# Patient Record
Sex: Male | Born: 1935 | Race: White | Hispanic: No | Marital: Married | Smoking: Former smoker
Health system: Southern US, Community
[De-identification: ages and names within clinical notes are randomized; demographics above are authoritative.]

## PROBLEM LIST (undated history)

## (undated) DIAGNOSIS — D126 Benign neoplasm of colon, unspecified: Secondary | ICD-10-CM

## (undated) DIAGNOSIS — I714 Abdominal aortic aneurysm, without rupture, unspecified: Secondary | ICD-10-CM

## (undated) DIAGNOSIS — J449 Chronic obstructive pulmonary disease, unspecified: Secondary | ICD-10-CM

## (undated) DIAGNOSIS — M109 Gout, unspecified: Secondary | ICD-10-CM

## (undated) DIAGNOSIS — K8020 Calculus of gallbladder without cholecystitis without obstruction: Secondary | ICD-10-CM

## (undated) DIAGNOSIS — I639 Cerebral infarction, unspecified: Secondary | ICD-10-CM

## (undated) DIAGNOSIS — K2980 Duodenitis without bleeding: Secondary | ICD-10-CM

## (undated) DIAGNOSIS — N400 Enlarged prostate without lower urinary tract symptoms: Secondary | ICD-10-CM

## (undated) DIAGNOSIS — I10 Essential (primary) hypertension: Secondary | ICD-10-CM

## (undated) DIAGNOSIS — K2990 Gastroduodenitis, unspecified, without bleeding: Secondary | ICD-10-CM

## (undated) DIAGNOSIS — R110 Nausea: Secondary | ICD-10-CM

## (undated) DIAGNOSIS — I739 Peripheral vascular disease, unspecified: Secondary | ICD-10-CM

## (undated) DIAGNOSIS — R739 Hyperglycemia, unspecified: Secondary | ICD-10-CM

## (undated) DIAGNOSIS — K222 Esophageal obstruction: Secondary | ICD-10-CM

## (undated) DIAGNOSIS — F17210 Nicotine dependence, cigarettes, uncomplicated: Secondary | ICD-10-CM

## (undated) DIAGNOSIS — M545 Low back pain, unspecified: Secondary | ICD-10-CM

## (undated) DIAGNOSIS — T7840XA Allergy, unspecified, initial encounter: Secondary | ICD-10-CM

## (undated) DIAGNOSIS — K2970 Gastritis, unspecified, without bleeding: Secondary | ICD-10-CM

## (undated) DIAGNOSIS — Z87442 Personal history of urinary calculi: Secondary | ICD-10-CM

## (undated) DIAGNOSIS — E785 Hyperlipidemia, unspecified: Secondary | ICD-10-CM

## (undated) DIAGNOSIS — IMO0002 Reserved for concepts with insufficient information to code with codable children: Secondary | ICD-10-CM

## (undated) DIAGNOSIS — K5730 Diverticulosis of large intestine without perforation or abscess without bleeding: Secondary | ICD-10-CM

## (undated) HISTORY — DX: Low back pain: M54.5

## (undated) HISTORY — DX: Esophageal obstruction: K22.2

## (undated) HISTORY — DX: Peripheral vascular disease, unspecified: I73.9

## (undated) HISTORY — DX: Cerebral infarction, unspecified: I63.9

## (undated) HISTORY — DX: Hyperlipidemia, unspecified: E78.5

## (undated) HISTORY — DX: Reserved for concepts with insufficient information to code with codable children: IMO0002

## (undated) HISTORY — DX: Gastritis, unspecified, without bleeding: K29.70

## (undated) HISTORY — DX: Hyperglycemia, unspecified: R73.9

## (undated) HISTORY — DX: Benign prostatic hyperplasia without lower urinary tract symptoms: N40.0

## (undated) HISTORY — DX: Essential (primary) hypertension: I10

## (undated) HISTORY — DX: Duodenitis without bleeding: K29.80

## (undated) HISTORY — PX: TOE AMPUTATION: SHX809

## (undated) HISTORY — DX: Chronic obstructive pulmonary disease, unspecified: J44.9

## (undated) HISTORY — DX: Diverticulosis of large intestine without perforation or abscess without bleeding: K57.30

## (undated) HISTORY — DX: Nicotine dependence, cigarettes, uncomplicated: F17.210

## (undated) HISTORY — PX: FEMORAL-POPLITEAL BYPASS GRAFT: SHX937

## (undated) HISTORY — PX: OTHER SURGICAL HISTORY: SHX169

## (undated) HISTORY — DX: Benign neoplasm of colon, unspecified: D12.6

## (undated) HISTORY — DX: Calculus of gallbladder without cholecystitis without obstruction: K80.20

## (undated) HISTORY — DX: Gastroduodenitis, unspecified, without bleeding: K29.90

## (undated) HISTORY — DX: Allergy, unspecified, initial encounter: T78.40XA

## (undated) HISTORY — PX: CATARACT EXTRACTION: SUR2

## (undated) HISTORY — DX: Gout, unspecified: M10.9

## (undated) HISTORY — DX: Abdominal aortic aneurysm, without rupture, unspecified: I71.40

## (undated) HISTORY — DX: Low back pain, unspecified: M54.50

## (undated) HISTORY — DX: Nausea: R11.0

## (undated) HISTORY — DX: Personal history of urinary calculi: Z87.442

## (undated) HISTORY — PX: EYE SURGERY: SHX253

## (undated) HISTORY — DX: Abdominal aortic aneurysm, without rupture: I71.4

---

## 2001-04-12 ENCOUNTER — Encounter: Payer: Self-pay | Admitting: Family Medicine

## 2001-04-12 LAB — CONVERTED CEMR LAB: WBC, blood: 6.6 10*3/uL

## 2001-07-04 ENCOUNTER — Ambulatory Visit (HOSPITAL_BASED_OUTPATIENT_CLINIC_OR_DEPARTMENT_OTHER): Admission: RE | Admit: 2001-07-04 | Discharge: 2001-07-04 | Payer: Self-pay | Admitting: Plastic Surgery

## 2001-10-14 ENCOUNTER — Encounter: Payer: Self-pay | Admitting: Surgery

## 2001-10-19 ENCOUNTER — Ambulatory Visit (HOSPITAL_COMMUNITY): Admission: RE | Admit: 2001-10-19 | Discharge: 2001-10-19 | Payer: Self-pay | Admitting: Surgery

## 2002-03-21 HISTORY — PX: INGUINAL HERNIA REPAIR: SHX194

## 2002-04-14 ENCOUNTER — Encounter: Payer: Self-pay | Admitting: Family Medicine

## 2002-04-14 LAB — CONVERTED CEMR LAB
Blood Glucose, Fasting: 111 mg/dL
PSA: 0.5 ng/mL
RBC count: 4.49 10*6/uL
TSH: 0.61 microintl units/mL
WBC, blood: 7.7 10*3/uL

## 2003-04-17 ENCOUNTER — Encounter: Payer: Self-pay | Admitting: Family Medicine

## 2003-04-17 LAB — CONVERTED CEMR LAB: Blood Glucose, Fasting: 115 mg/dL

## 2003-04-18 ENCOUNTER — Encounter: Payer: Self-pay | Admitting: Family Medicine

## 2003-04-18 LAB — CONVERTED CEMR LAB: PSA: 0.4 ng/mL

## 2003-06-01 ENCOUNTER — Encounter: Payer: Self-pay | Admitting: Family Medicine

## 2003-06-01 LAB — CONVERTED CEMR LAB
RBC count: 4.51 10*6/uL
WBC, blood: 6.8 10*3/uL

## 2003-07-16 ENCOUNTER — Encounter: Payer: Self-pay | Admitting: Family Medicine

## 2003-07-16 LAB — CONVERTED CEMR LAB: WBC, blood: 6.9 10*3/uL

## 2004-05-27 ENCOUNTER — Encounter: Payer: Self-pay | Admitting: Family Medicine

## 2004-05-27 LAB — CONVERTED CEMR LAB
Blood Glucose, Fasting: 108 mg/dL
TSH: 0.86 microintl units/mL
WBC, blood: 6.1 10*3/uL

## 2004-06-30 ENCOUNTER — Ambulatory Visit: Payer: Self-pay | Admitting: Family Medicine

## 2004-08-22 ENCOUNTER — Ambulatory Visit: Payer: Self-pay | Admitting: Family Medicine

## 2004-08-26 ENCOUNTER — Ambulatory Visit: Payer: Self-pay | Admitting: Family Medicine

## 2004-09-02 ENCOUNTER — Ambulatory Visit: Payer: Self-pay | Admitting: Family Medicine

## 2004-10-09 HISTORY — PX: OTHER SURGICAL HISTORY: SHX169

## 2004-10-13 ENCOUNTER — Ambulatory Visit: Payer: Self-pay | Admitting: Family Medicine

## 2004-10-16 ENCOUNTER — Encounter: Admission: RE | Admit: 2004-10-16 | Discharge: 2004-10-16 | Payer: Self-pay | Admitting: Family Medicine

## 2004-11-03 ENCOUNTER — Ambulatory Visit: Payer: Self-pay | Admitting: Family Medicine

## 2004-11-17 ENCOUNTER — Ambulatory Visit: Payer: Self-pay | Admitting: Family Medicine

## 2004-12-03 ENCOUNTER — Ambulatory Visit: Payer: Self-pay | Admitting: Gastroenterology

## 2004-12-10 ENCOUNTER — Ambulatory Visit: Payer: Self-pay | Admitting: Gastroenterology

## 2004-12-15 ENCOUNTER — Ambulatory Visit: Payer: Self-pay | Admitting: Gastroenterology

## 2004-12-15 HISTORY — PX: ESOPHAGOGASTRODUODENOSCOPY: SHX1529

## 2005-01-02 ENCOUNTER — Ambulatory Visit: Payer: Self-pay | Admitting: Gastroenterology

## 2005-02-02 ENCOUNTER — Encounter: Admission: RE | Admit: 2005-02-02 | Discharge: 2005-02-02 | Payer: Self-pay | Admitting: *Deleted

## 2005-02-12 ENCOUNTER — Ambulatory Visit: Payer: Self-pay | Admitting: Family Medicine

## 2005-03-31 ENCOUNTER — Ambulatory Visit: Payer: Self-pay | Admitting: Family Medicine

## 2005-05-21 ENCOUNTER — Ambulatory Visit: Payer: Self-pay | Admitting: Family Medicine

## 2005-05-21 LAB — CONVERTED CEMR LAB
RBC count: 4.12 10*6/uL
WBC, blood: 6.5 10*3/uL

## 2005-05-25 ENCOUNTER — Ambulatory Visit: Payer: Self-pay | Admitting: Family Medicine

## 2005-06-08 ENCOUNTER — Ambulatory Visit: Payer: Self-pay | Admitting: Family Medicine

## 2005-07-06 HISTORY — PX: OTHER SURGICAL HISTORY: SHX169

## 2005-12-24 HISTORY — PX: OTHER SURGICAL HISTORY: SHX169

## 2006-05-18 ENCOUNTER — Ambulatory Visit: Payer: Self-pay | Admitting: Internal Medicine

## 2006-06-10 ENCOUNTER — Ambulatory Visit: Payer: Self-pay | Admitting: Family Medicine

## 2006-06-10 LAB — CONVERTED CEMR LAB: Blood Glucose, Fasting: 114 mg/dL

## 2006-06-14 ENCOUNTER — Ambulatory Visit: Payer: Self-pay | Admitting: Family Medicine

## 2006-06-24 HISTORY — PX: OTHER SURGICAL HISTORY: SHX169

## 2006-07-12 ENCOUNTER — Ambulatory Visit: Payer: Self-pay

## 2006-07-22 ENCOUNTER — Encounter: Admission: RE | Admit: 2006-07-22 | Discharge: 2006-07-22 | Payer: Self-pay | Admitting: *Deleted

## 2006-07-22 HISTORY — PX: OTHER SURGICAL HISTORY: SHX169

## 2006-07-28 ENCOUNTER — Ambulatory Visit: Payer: Self-pay | Admitting: Family Medicine

## 2006-07-28 LAB — CONVERTED CEMR LAB: Blood Glucose, Fasting: 120 mg/dL

## 2006-07-29 ENCOUNTER — Ambulatory Visit (HOSPITAL_COMMUNITY): Admission: RE | Admit: 2006-07-29 | Discharge: 2006-07-29 | Payer: Self-pay | Admitting: Vascular Surgery

## 2006-07-29 HISTORY — PX: OTHER SURGICAL HISTORY: SHX169

## 2006-08-17 DIAGNOSIS — I639 Cerebral infarction, unspecified: Secondary | ICD-10-CM

## 2006-08-17 HISTORY — DX: Cerebral infarction, unspecified: I63.9

## 2006-08-31 ENCOUNTER — Inpatient Hospital Stay (HOSPITAL_COMMUNITY): Admission: RE | Admit: 2006-08-31 | Discharge: 2006-09-03 | Payer: Self-pay | Admitting: *Deleted

## 2006-08-31 ENCOUNTER — Encounter (INDEPENDENT_AMBULATORY_CARE_PROVIDER_SITE_OTHER): Payer: Self-pay | Admitting: Specialist

## 2006-08-31 HISTORY — PX: OTHER SURGICAL HISTORY: SHX169

## 2006-09-01 HISTORY — PX: OTHER SURGICAL HISTORY: SHX169

## 2006-10-11 ENCOUNTER — Ambulatory Visit: Payer: Self-pay | Admitting: Vascular Surgery

## 2006-10-13 ENCOUNTER — Ambulatory Visit (HOSPITAL_COMMUNITY): Admission: RE | Admit: 2006-10-13 | Discharge: 2006-10-13 | Payer: Self-pay | Admitting: Urology

## 2006-10-21 ENCOUNTER — Ambulatory Visit: Payer: Self-pay | Admitting: *Deleted

## 2006-10-26 ENCOUNTER — Ambulatory Visit (HOSPITAL_BASED_OUTPATIENT_CLINIC_OR_DEPARTMENT_OTHER): Admission: RE | Admit: 2006-10-26 | Discharge: 2006-10-26 | Payer: Self-pay | Admitting: Urology

## 2006-11-18 ENCOUNTER — Ambulatory Visit: Payer: Self-pay | Admitting: *Deleted

## 2006-11-18 HISTORY — PX: OTHER SURGICAL HISTORY: SHX169

## 2007-06-06 ENCOUNTER — Ambulatory Visit: Payer: Self-pay | Admitting: Family Medicine

## 2007-06-07 ENCOUNTER — Telehealth: Payer: Self-pay | Admitting: Family Medicine

## 2007-06-09 ENCOUNTER — Ambulatory Visit: Payer: Self-pay | Admitting: *Deleted

## 2007-06-09 ENCOUNTER — Encounter: Admission: RE | Admit: 2007-06-09 | Discharge: 2007-06-09 | Payer: Self-pay | Admitting: *Deleted

## 2007-07-13 HISTORY — PX: OTHER SURGICAL HISTORY: SHX169

## 2007-08-29 ENCOUNTER — Ambulatory Visit: Payer: Self-pay | Admitting: Family Medicine

## 2007-08-31 ENCOUNTER — Encounter: Payer: Self-pay | Admitting: Family Medicine

## 2008-01-12 ENCOUNTER — Ambulatory Visit: Payer: Self-pay | Admitting: *Deleted

## 2008-04-10 ENCOUNTER — Ambulatory Visit: Payer: Self-pay | Admitting: Family Medicine

## 2008-04-10 DIAGNOSIS — R110 Nausea: Secondary | ICD-10-CM

## 2008-04-11 LAB — CONVERTED CEMR LAB
ALT: 14 units/L (ref 0–53)
Albumin: 4 g/dL (ref 3.5–5.2)
Basophils Absolute: 0.1 10*3/uL (ref 0.0–0.1)
Basophils Relative: 1.2 % (ref 0.0–3.0)
Bilirubin, Direct: 0.1 mg/dL (ref 0.0–0.3)
Creatinine, Ser: 1.7 mg/dL — ABNORMAL HIGH (ref 0.4–1.5)
Eosinophils Absolute: 0.2 10*3/uL (ref 0.0–0.7)
Eosinophils Relative: 3.1 % (ref 0.0–5.0)
Hemoglobin: 13 g/dL (ref 13.0–17.0)
Lymphocytes Relative: 25.7 % (ref 12.0–46.0)
MCV: 92.6 fL (ref 78.0–100.0)
Monocytes Absolute: 0.3 10*3/uL (ref 0.1–1.0)
Monocytes Relative: 4.5 % (ref 3.0–12.0)
Neutro Abs: 4.3 10*3/uL (ref 1.4–7.7)
Platelets: 268 10*3/uL (ref 150–400)
Potassium: 4.9 meq/L (ref 3.5–5.1)
RBC: 4.03 M/uL — ABNORMAL LOW (ref 4.22–5.81)
Sodium: 141 meq/L (ref 135–145)
Total Bilirubin: 0.7 mg/dL (ref 0.3–1.2)

## 2008-04-25 ENCOUNTER — Ambulatory Visit: Payer: Self-pay | Admitting: Family Medicine

## 2008-04-25 DIAGNOSIS — J310 Chronic rhinitis: Secondary | ICD-10-CM | POA: Insufficient documentation

## 2008-05-10 ENCOUNTER — Ambulatory Visit: Payer: Self-pay | Admitting: Family Medicine

## 2008-05-24 ENCOUNTER — Ambulatory Visit: Payer: Self-pay | Admitting: Family Medicine

## 2008-05-24 ENCOUNTER — Encounter: Payer: Self-pay | Admitting: Family Medicine

## 2008-05-24 DIAGNOSIS — M109 Gout, unspecified: Secondary | ICD-10-CM

## 2008-05-24 DIAGNOSIS — F17200 Nicotine dependence, unspecified, uncomplicated: Secondary | ICD-10-CM | POA: Insufficient documentation

## 2008-05-24 DIAGNOSIS — K2990 Gastroduodenitis, unspecified, without bleeding: Secondary | ICD-10-CM

## 2008-05-24 DIAGNOSIS — K2970 Gastritis, unspecified, without bleeding: Secondary | ICD-10-CM | POA: Insufficient documentation

## 2008-05-24 DIAGNOSIS — M545 Low back pain: Secondary | ICD-10-CM

## 2008-05-24 DIAGNOSIS — I714 Abdominal aortic aneurysm, without rupture, unspecified: Secondary | ICD-10-CM | POA: Insufficient documentation

## 2008-05-24 DIAGNOSIS — E785 Hyperlipidemia, unspecified: Secondary | ICD-10-CM

## 2008-05-24 DIAGNOSIS — D126 Benign neoplasm of colon, unspecified: Secondary | ICD-10-CM

## 2008-05-24 DIAGNOSIS — I6350 Cerebral infarction due to unspecified occlusion or stenosis of unspecified cerebral artery: Secondary | ICD-10-CM | POA: Insufficient documentation

## 2008-05-24 DIAGNOSIS — IMO0002 Reserved for concepts with insufficient information to code with codable children: Secondary | ICD-10-CM | POA: Insufficient documentation

## 2008-05-24 DIAGNOSIS — R7309 Other abnormal glucose: Secondary | ICD-10-CM

## 2008-05-24 DIAGNOSIS — K222 Esophageal obstruction: Secondary | ICD-10-CM

## 2008-05-24 DIAGNOSIS — K2980 Duodenitis without bleeding: Secondary | ICD-10-CM | POA: Insufficient documentation

## 2008-05-24 DIAGNOSIS — Z87442 Personal history of urinary calculi: Secondary | ICD-10-CM

## 2008-06-29 ENCOUNTER — Ambulatory Visit: Payer: Self-pay | Admitting: Family Medicine

## 2008-07-05 ENCOUNTER — Ambulatory Visit: Payer: Self-pay | Admitting: *Deleted

## 2008-08-15 ENCOUNTER — Telehealth: Payer: Self-pay | Admitting: Family Medicine

## 2008-08-21 ENCOUNTER — Telehealth: Payer: Self-pay | Admitting: Family Medicine

## 2008-10-03 ENCOUNTER — Telehealth: Payer: Self-pay | Admitting: Family Medicine

## 2008-11-16 IMAGING — CT CT ANGIO ABDOMEN
2 of 8 series · 14 of 46 positions shown, 18 images · IV contrast ([ID] OMNI 350)
Comparison: GI [REDACTED] abdominal pelvic CT 02/02/05.
COMPARISON: GI [REDACTED] pelvic CT 02/02/05.

CLINICAL DATA: Pre-stent AAA.  Asymptomatic prior bilateral inguinal herniorrhaphies.  
CT ANGIO ABDOMEN WITH CONTRAST:
TECHNIQUE: Multidetector CT imaging of the abdomen and pelvis was performed during bolus injection of intravenous contrast.  Multiplanar CT angiographic image reconstructions were generated to evaluate the vascular anatomy. 
Contrast:  441cc Omnipaque 300.

[Series 5: angio · axial · 0.78mm/px · z∈[-518,-144]mm · 11 of 179 slices shown, 15 images]
[im 19/179  soft-tissue]
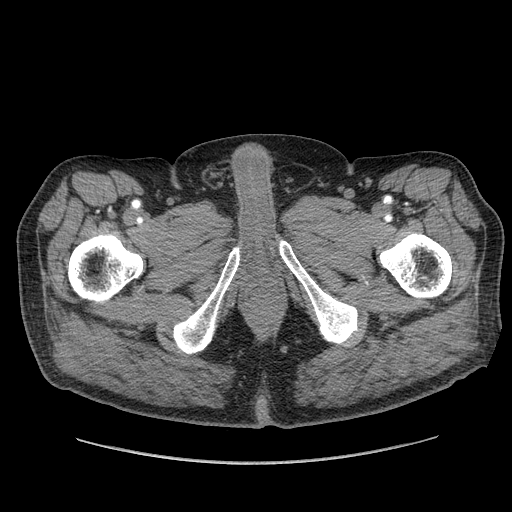
[im 19/179  bone]
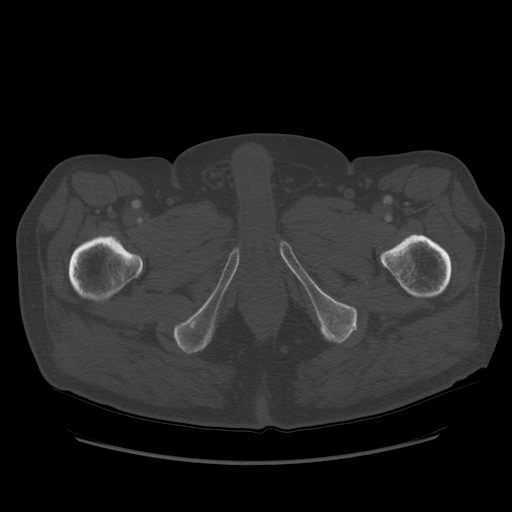
[im 38/179  soft-tissue]
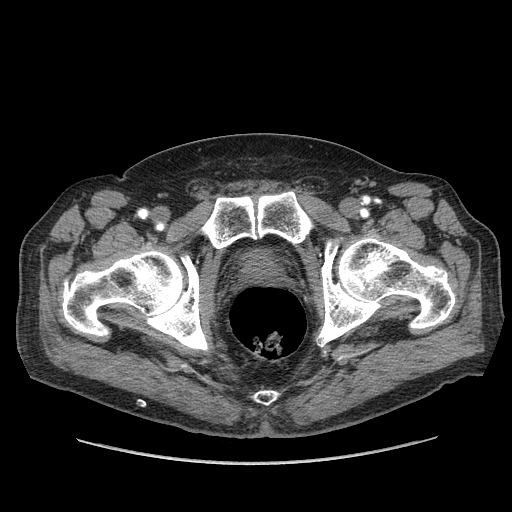
[im 57/179  soft-tissue]
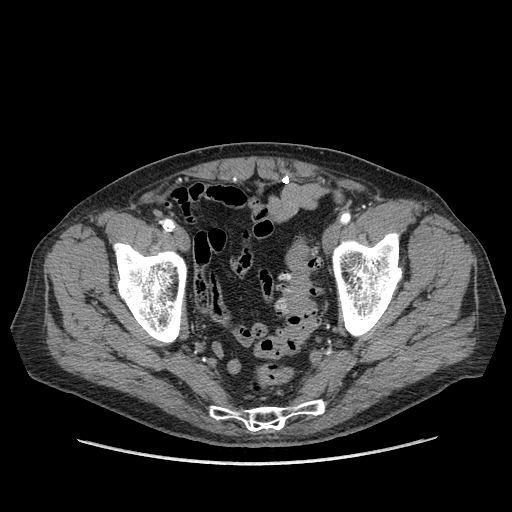
[im 75/179  soft-tissue]
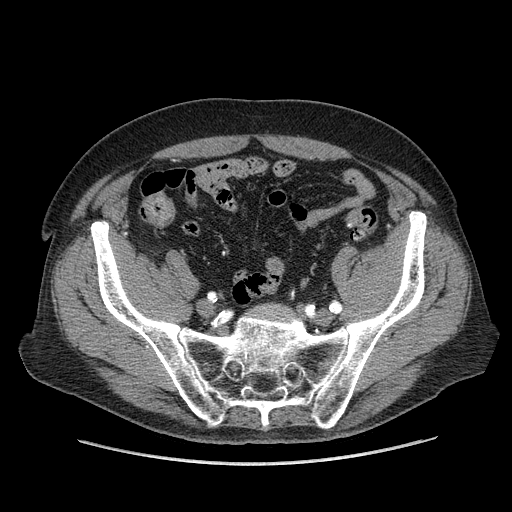
[im 94/179  soft-tissue]
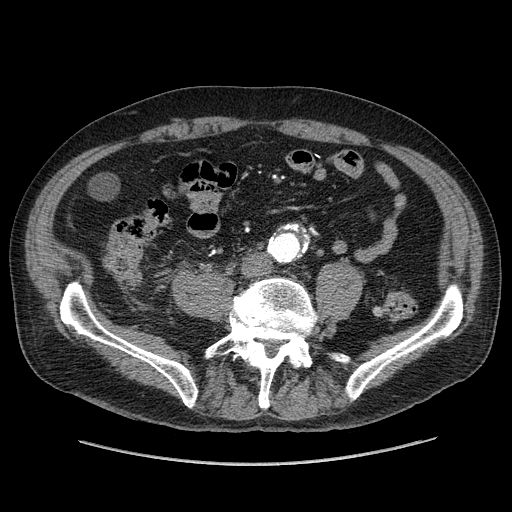
[im 113/179  soft-tissue]
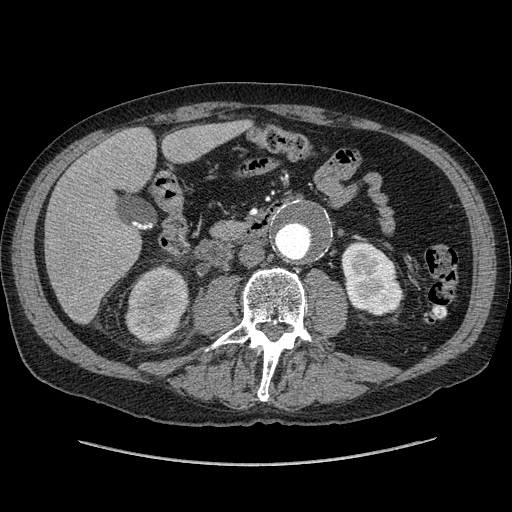
[im 132/179  soft-tissue]
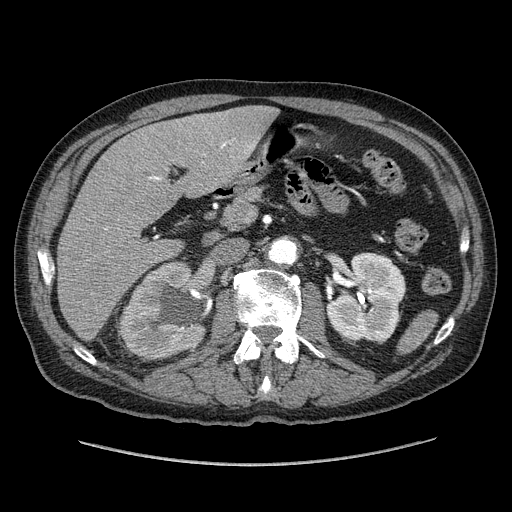
[im 141/179  lung]
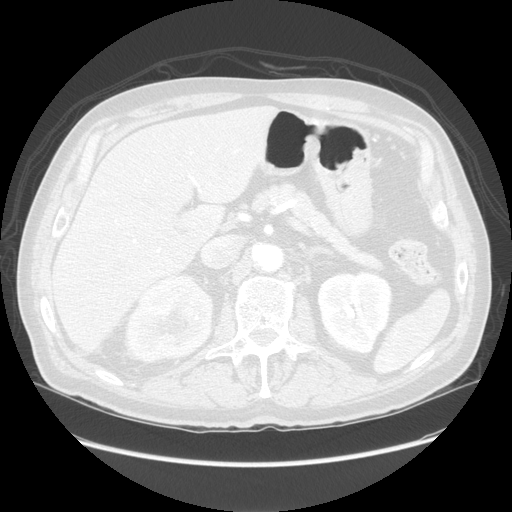
[im 150/179  soft-tissue]
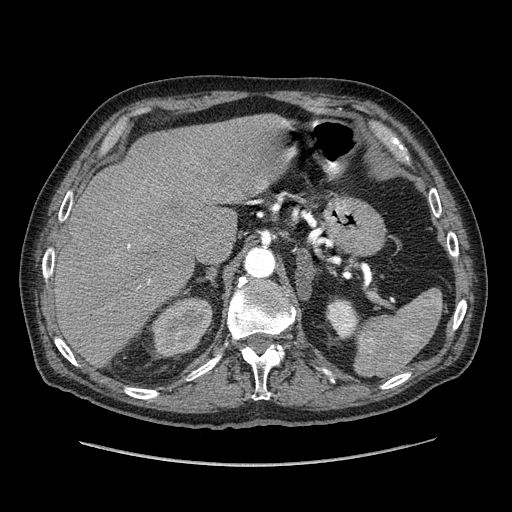
[im 150/179  lung]
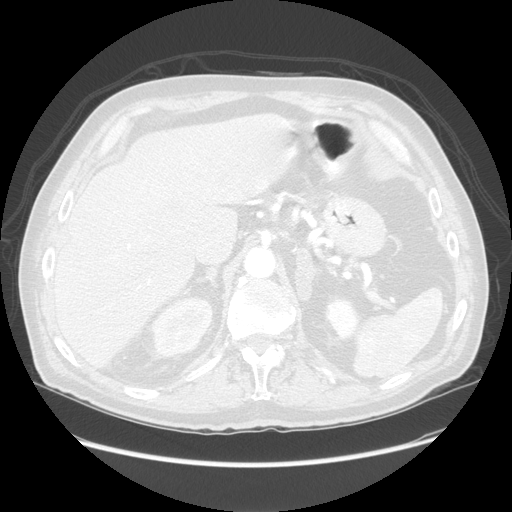
[im 160/179  lung]
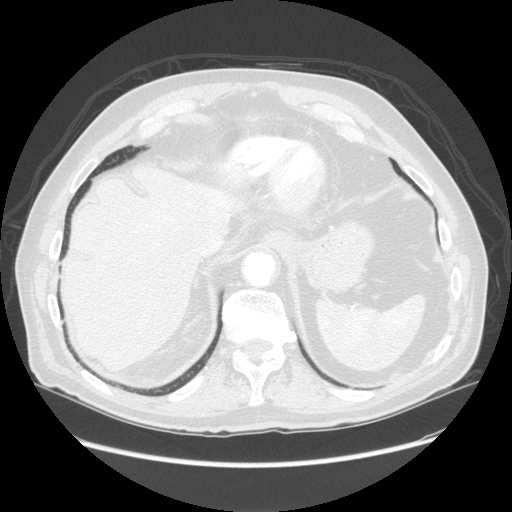
[im 169/179  soft-tissue]
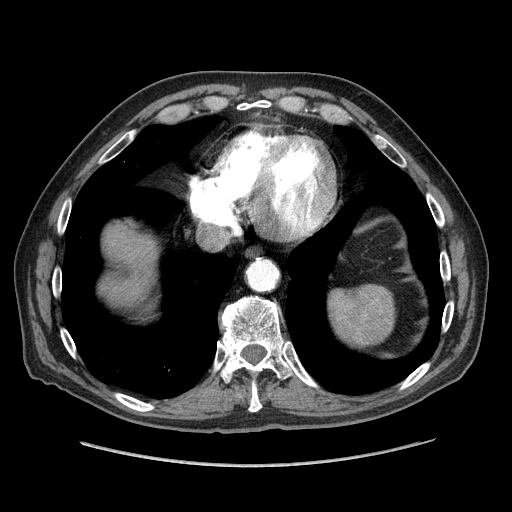
[im 169/179  lung]
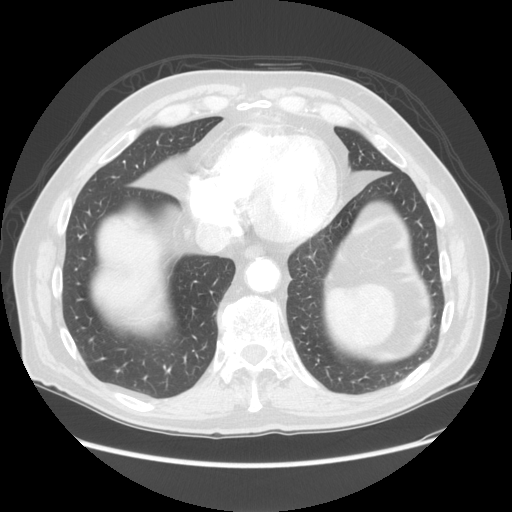
[im 169/179  bone]
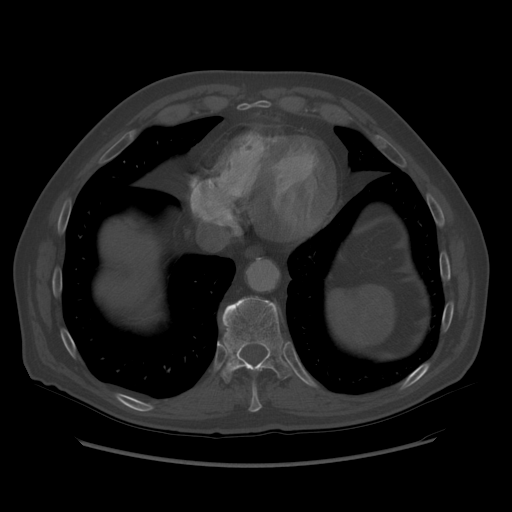

[Series 602: sagittal angio · sagittal · 0.87mm/px · 3 of 161 slices shown]
[im 41/161  soft-tissue]
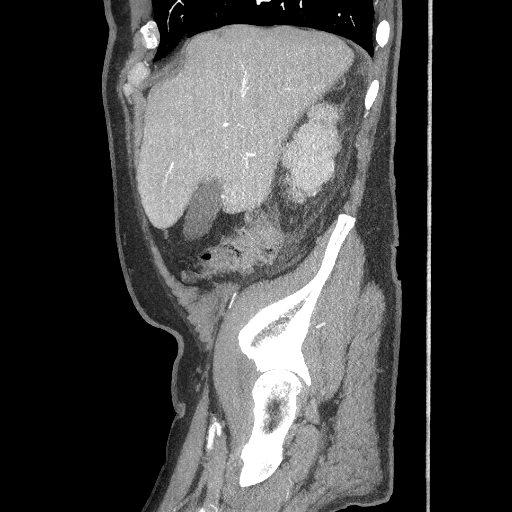
[im 81/161  soft-tissue]
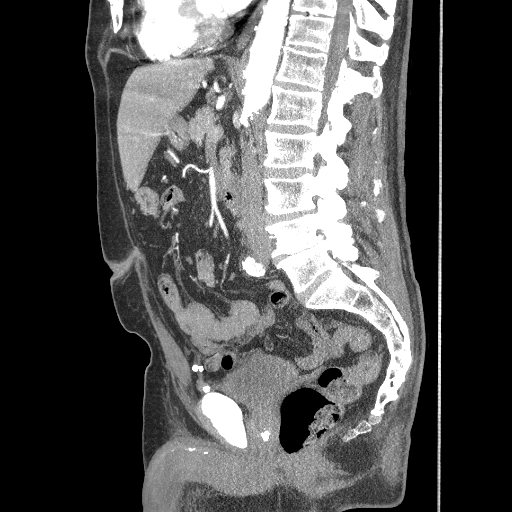
[im 121/161  soft-tissue]
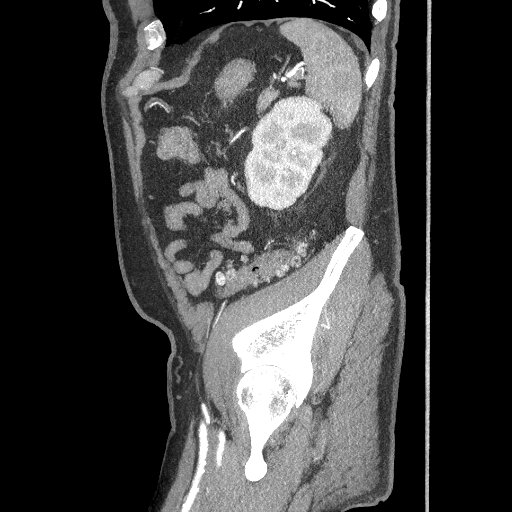

[14 of 46 positions shown; findings below may reference images not displayed]

A.36mm  Length of infrarenal neck below lowest renal artery to top of aneurysmal aorta
  Number of Renal Arteries  (R)1 (L)1
B.30mm  Diameter of infrarenal neck
C.81mm  Total length of Aneurysm
  Does aneurysm end at Aortic Bifurcation? Y
  If not, distance from aneurysm to bifurcation_
D.53mm  Greatest aneurysm diameter
E.16mm  (R) Greatest Common Iliac Artery diameters?
18mm  (L)
F.12mm  (R) Diameter of Common Iliac Arteries just above iliac bifurcation
15mm  (L)
G.56mm  (R) Approximate length of Common Iliac Arteries
60mm  (L)
H.  Comments:
FINDINGS: No change in peripherally thrombosed distal abdominal aortic aneurysm with central patency.  Visceral arterial branches are patent with prompt left renal excretion of intravenous contrast. Slightly delayed contrast excretion from the right kidney is due to obstructing mid right ureteral 8mm calculus.  Slight interval right perinephric stranding due to hydronephrosis is seen.  Heart size is normal with slight left ventricular hypertrophy findings.  Left adrenal diffuse hyperplasia is seen with small 6mm fatty focus consistent with incidental myelolipoma.  Non obstructing sub-cm gallstones noted as stable.  Slight descending colonic diverticulosis without inflammatory change is seen.  Moderate L4-5 and slight to moderate L5-S1 degenerative disc disease is seen with slight posterolateral spondylosis causing bilateral neural foraminal narrowing at these levels.  Remaining abdominal organs appear normal without inflammation nor adenopathy.
IMPRESSION: 1.  Pre-stent graft measurements of distal abdominal aortic aneurysm as described. 
2.  Interval obstructing mid right ureteral 8mm calculus with right hydronephrosis and perinephric stranding. 
3.  Additional incidental findings as described. 
4.  Otherwise no acute abnormality. 
CT ANGIO PELVIS WITH CONTRAST:
FINDINGS: Pre-stent graft measurements submitted for review.  Pelvic arteries are patent.  Inguinal herniorrhaphy MESH noted.  Moderate sigmoid diverticulosis without inflammatory change is seen.  Small left posterolateral 1.7cm bladder diverticulum incidentally noted (image 132).  Remaining pelvic organs are unremarkable with normal appearing appendix.
IMPRESSION: 1.  Pre-stent graft measurements submitted for review with patent pelvic arteries with atheromatous vascular calcification.  
2.  Sigmoid diverticulosis without inflammatory change. 
3.  1.7cm left posterolateral bladder diverticulum. 
4.  Post herniorrhaphy MESH. 
5.  Otherwise negative.

## 2009-01-03 ENCOUNTER — Ambulatory Visit: Payer: Self-pay | Admitting: *Deleted

## 2009-03-06 ENCOUNTER — Ambulatory Visit: Payer: Self-pay | Admitting: Family Medicine

## 2009-03-15 ENCOUNTER — Ambulatory Visit: Payer: Self-pay | Admitting: Family Medicine

## 2009-03-15 LAB — CONVERTED CEMR LAB
ALT: 13 units/L (ref 0–53)
Alkaline Phosphatase: 38 units/L — ABNORMAL LOW (ref 39–117)
Basophils Absolute: 0.1 10*3/uL (ref 0.0–0.1)
Basophils Relative: 1.4 % (ref 0.0–3.0)
Bilirubin, Direct: 0.1 mg/dL (ref 0.0–0.3)
Calcium: 9.4 mg/dL (ref 8.4–10.5)
Chloride: 106 meq/L (ref 96–112)
Cholesterol: 202 mg/dL — ABNORMAL HIGH (ref 0–200)
Creatinine,U: 167.9 mg/dL
Eosinophils Absolute: 0.2 10*3/uL (ref 0.0–0.7)
Eosinophils Relative: 3.9 % (ref 0.0–5.0)
GFR calc non Af Amer: 48.62 mL/min (ref >60)
Glucose, Bld: 94 mg/dL (ref 70–99)
Lymphocytes Relative: 21.4 % (ref 12.0–46.0)
Lymphs Abs: 1.2 10*3/uL (ref 0.7–4.0)
MCHC: 33.9 g/dL (ref 30.0–36.0)
Monocytes Absolute: 0.6 10*3/uL (ref 0.1–1.0)
Neutrophils Relative %: 62.4 % (ref 43.0–77.0)
Potassium: 4.4 meq/L (ref 3.5–5.1)
RBC: 4.34 M/uL (ref 4.22–5.81)
RDW: 13.1 % (ref 11.5–14.6)
Total Bilirubin: 0.9 mg/dL (ref 0.3–1.2)
Total Protein: 7.5 g/dL (ref 6.0–8.3)
Uric Acid, Serum: 5.5 mg/dL (ref 4.0–7.8)
WBC: 5.4 10*3/uL (ref 4.5–10.5)

## 2009-03-18 LAB — CONVERTED CEMR LAB: Vit D, 25-Hydroxy: 20 ng/mL — ABNORMAL LOW (ref 30–89)

## 2009-03-21 ENCOUNTER — Ambulatory Visit: Payer: Self-pay | Admitting: Family Medicine

## 2009-06-26 ENCOUNTER — Ambulatory Visit: Payer: Self-pay | Admitting: Vascular Surgery

## 2009-07-01 ENCOUNTER — Telehealth: Payer: Self-pay | Admitting: Family Medicine

## 2009-08-07 ENCOUNTER — Encounter: Payer: Self-pay | Admitting: Family Medicine

## 2009-09-24 ENCOUNTER — Ambulatory Visit: Payer: Self-pay | Admitting: Family Medicine

## 2009-09-24 DIAGNOSIS — I10 Essential (primary) hypertension: Secondary | ICD-10-CM | POA: Insufficient documentation

## 2009-10-07 ENCOUNTER — Encounter: Payer: Self-pay | Admitting: Family Medicine

## 2010-01-08 ENCOUNTER — Ambulatory Visit: Payer: Self-pay | Admitting: Vascular Surgery

## 2010-02-05 ENCOUNTER — Telehealth: Payer: Self-pay | Admitting: Family Medicine

## 2010-03-25 ENCOUNTER — Encounter (INDEPENDENT_AMBULATORY_CARE_PROVIDER_SITE_OTHER): Payer: Self-pay | Admitting: *Deleted

## 2010-03-27 ENCOUNTER — Encounter: Payer: Self-pay | Admitting: Family Medicine

## 2010-03-27 ENCOUNTER — Ambulatory Visit: Payer: Self-pay | Admitting: Family Medicine

## 2010-03-27 DIAGNOSIS — K219 Gastro-esophageal reflux disease without esophagitis: Secondary | ICD-10-CM

## 2010-03-27 DIAGNOSIS — L570 Actinic keratosis: Secondary | ICD-10-CM

## 2010-04-10 ENCOUNTER — Ambulatory Visit: Payer: Self-pay | Admitting: Family Medicine

## 2010-04-11 ENCOUNTER — Encounter (INDEPENDENT_AMBULATORY_CARE_PROVIDER_SITE_OTHER): Payer: Self-pay | Admitting: *Deleted

## 2010-04-11 LAB — CONVERTED CEMR LAB: Fecal Occult Bld: NEGATIVE

## 2010-04-16 ENCOUNTER — Telehealth: Payer: Self-pay | Admitting: Family Medicine

## 2010-04-23 ENCOUNTER — Ambulatory Visit: Payer: Self-pay | Admitting: Family Medicine

## 2010-04-24 LAB — CONVERTED CEMR LAB
ALT: 12 U/L
AST: 19 U/L
Albumin: 4 g/dL
Alkaline Phosphatase: 33 U/L — ABNORMAL LOW
BUN: 28 mg/dL — ABNORMAL HIGH
Bilirubin, Direct: 0.2 mg/dL
CO2: 29 meq/L
Calcium: 9.7 mg/dL
Chloride: 105 meq/L
Cholesterol: 208 mg/dL — ABNORMAL HIGH
Creatinine, Ser: 1.5 mg/dL
Direct LDL: 141.8 mg/dL
GFR calc non Af Amer: 47.74 mL/min
Glucose, Bld: 88 mg/dL
HDL: 31.5 mg/dL — ABNORMAL LOW
Potassium: 5.2 meq/L — ABNORMAL HIGH
Sodium: 141 meq/L
Total Bilirubin: 0.7 mg/dL
Total CHOL/HDL Ratio: 7
Total Protein: 7.6 g/dL
Triglycerides: 191 mg/dL — ABNORMAL HIGH
VLDL: 38.2 mg/dL

## 2010-05-02 ENCOUNTER — Ambulatory Visit: Payer: Self-pay | Admitting: Family Medicine

## 2010-05-02 DIAGNOSIS — E875 Hyperkalemia: Secondary | ICD-10-CM

## 2010-05-02 LAB — CONVERTED CEMR LAB: Potassium: 4.5 meq/L (ref 3.5–5.1)

## 2010-08-17 DIAGNOSIS — I779 Disorder of arteries and arterioles, unspecified: Secondary | ICD-10-CM

## 2010-08-17 HISTORY — DX: Disorder of arteries and arterioles, unspecified: I77.9

## 2010-09-04 ENCOUNTER — Ambulatory Visit: Admit: 2010-09-04 | Payer: Self-pay | Admitting: Vascular Surgery

## 2010-09-04 ENCOUNTER — Ambulatory Visit
Admission: RE | Admit: 2010-09-04 | Discharge: 2010-09-04 | Payer: Self-pay | Source: Home / Self Care | Attending: Vascular Surgery | Admitting: Vascular Surgery

## 2010-09-04 ENCOUNTER — Encounter: Payer: Self-pay | Admitting: Family Medicine

## 2010-09-05 NOTE — Assessment & Plan Note (Signed)
OFFICE VISIT  IVAL, PACER DOB:  09-10-1932                                       09/04/2010 ZOXWR#:[id number]  CHIEF COMPLAINT:  Followup of carotid and aneurysm disease.  HISTORY OF PRESENT ILLNESS:  The patient is a 77 year old male who previously underwent aneurysm stent graft repair with an Endologix graft by Dr. Madilyn Fireman in January of 2008.  We have been following him long-term for this.  He was also being followed for a moderate carotid stenosis. He denies any symptoms of TIA, amaurosis or stroke.  He also continues to deny any abdominal or back pain.  He has really had no significant medical problems since his last office visit here in 2008.  Dr. Madilyn Fireman had previously been following the patient but since he has moved to [place] I have assumed the patient's care.  CHRONIC MEDICAL PROBLEMS:  Include hypertension which is currently controlled and followed by his primary medical doctor, Dr. Para March.  PAST SURGICAL HISTORY:  Abdominal aortic aneurysm and an abdominal hernia.  Past medical history is as listed above.  SOCIAL HISTORY:  He is married, has 1 child.  He is retired.  He is a one half pack per day smoker.  He does not consume alcohol regularly.  FAMILY HISTORY:  Remarkable for his mother who had vascular disease at age less than 69.  REVIEW OF SYSTEMS:  Full 12 point review of systems was performed with the patient today. VASCULAR:  Remarkable for prior TIA at the time of his aneurysm repair. GENERAL:  He is 6 feet 3 inches, 190 pounds. All other systems were negative.  MEDICATIONS: 1. Metoprolol 100 mg once a day. 2. Protonix 20 mg once a day. 3. Captopril 50 mg twice a day.  ALLERGIES:  He has an allergy listed to sulfa medications.  PHYSICAL EXAM:  Vital signs:  Today blood pressure is 171/91 in the left arm, 171/77 in the right arm, heart rate is 59 and regular, oxygen saturation is 99% on room air.  HEENT:  Unremarkable.   Neck:  Has 2+ carotid pulses without bruit.  Chest:  Clear to auscultation.  Cardiac: Regular rate and rhythm without murmur.  Abdomen:  Soft, nontender, nondistended.  No pulsatile mass.  Musculoskeletal:  Shows no major joint deformities.  Neurologic:  He has symmetric upper extremity and lower extremity motor strength which is 5/5.  Skin:  Has no open ulcers or rashes.  Extremities:  He has 2+ radial and 2+ femoral and 2+ popliteal pulses bilaterally.  He had an ultrasound of his aorta today which shows the aneurysm diameter has shrunk to 3.25 cm.  It was greater than 5 cm prior to repair.  Additionally, his carotid duplex scan today showed a 60% to 80% left internal carotid artery stenosis with no significant change since his previous duplex exam.  He had a less than 40% right internal carotid artery stenosis.  He had antegrade vertebral flow bilaterally.  I had a discussion with the patient today and encouraged him to try again to quit smoking.  He will think about this.  I did discuss with him the possibility of placing him on aspirin.  However, he states that aspirin has not agreed with his stomach in the past and he wishes not to take that currently.  As far as his aneurysm is concerned he will have  a carotid duplex in 6 months' time, the aortic ultrasound will be in 1 year's time.    Janetta Hora. Janautica Netzley, MD Electronically Signed  CEF/MEDQ  D:  09/04/2010  T:  09/05/2010  Job:  [id number]  cc:   Dr Para March

## 2010-09-12 NOTE — Procedures (Unsigned)
CAROTID DUPLEX EXAM  INDICATION:  Follow up known carotid disease.  HISTORY: Diabetes:  No Cardiac:  No Hypertension:  Yes Smoking:  Yes Previous Surgery:  No Amaurosis Fugax No, Paresthesias No, Hemiparesis No                                      RIGHT             LEFT Brachial systolic pressure:         164               168 Brachial Doppler waveforms:         WNL               WNL Vertebral direction of flow:        antegrade         antegrade DUPLEX VELOCITIES (cm/sec) CCA peak systolic                   68                64 ECA peak systolic                   228               190 ICA peak systolic                   106               245 ICA end diastolic                   27                60 PLAQUE MORPHOLOGY:                  heterogeneous heterogeneous/calcific PLAQUE AMOUNT:                      mild              moderate PLAQUE LOCATION:                    ECA/ICA           CCA/ECA/ICA  IMPRESSION: 1. Low-end 60% to 79% left internal carotid artery stenosis. 2. 1% to 39% right internal carotid artery stenosis. 3. Abnormal bilateral external carotid arteries. 4. Bilateral vertebral arteries are within normal limits.  ___________________________________________ Janetta Hora Fields, MD  LT/MEDQ  D:  09/04/2010  T:  09/04/2010  Job:  [id number]

## 2010-09-12 NOTE — Procedures (Unsigned)
VASCULAR LAB EXAM  INDICATION:  Follow up AAA endograft placed 08/31/2006.  HISTORY: Diabetes:  No Cardiac:  No Hypertension:  Yes  EXAM:  AAA sac size 3.24 cm AP by 3.25 cm transverse.  Previous sac size on 06/26/2009 4.03 cm AP by 3.6 cm transverse.  IMPRESSION: 1. The aorta and endograft appear patent. 2. No significant change in size of the aneurysmal sac surrounding the     endograft. 3. No evidence of endoleak was detected.  ___________________________________________ Janetta Hora. Fields, MD  LT/MEDQ  D:  09/04/2010  T:  09/04/2010  Job:  [id number]

## 2010-09-18 NOTE — Assessment & Plan Note (Signed)
Summary: F\U & ESTALISH W/NEW DOC / LFW   Vital Signs:  Patient profile:   77 year old male Height:      74 inches Weight:      189.25 pounds BMI:     24.39 Temp:     97.9 degrees F oral Pulse rate:   60 / minute Pulse rhythm:   regular BP sitting:   142 / 80  (left arm) Cuff size:   regular  Vitals Entered By: Delilah Shan CMA Duncan Dull) (March 27, 2010 8:10 AM) CC: 6 months follow up and Establish with Dr. Para March   History of Present Illness: Reflux- chronic problem for patient.  using reglan episodically with relief of reflux.  No h/o tardive symptoms.   COPD; Feels symptoms are well controlled: Using medications without problems: yes Night time symptoms: minimal ER visits since last visit:no Missed work or school:no Sputum production at baseline: occ and unchanged.  Increased cough:no Increased SOB:no Using O2: no  Hypertension:      Using medication without problems or lightheadedness: yes Chest pain with exertion:no Edema:no Short of breath:no Average home BPs: rarely checked, usually controlled  Elevated Cholesterol: Using medications without problems:yes Muscle aches: no Other complaints: no   Tobacco- not interested in stopping smoking.  <1ppd now.   Allergies: 1)  ! Sulfa  Past History:  Past Medical History: Current Problems:  BPH, HX OF (DR DAVIS) (ICD-V13.8) HYPERLIPIDEMIA (ICD-272.4) GALLSTONES  VIA CT (ICD-574.20) CIGARETTE SMOKER (ICD-305.1) CVA (ICD-434.91) DEGENERATIVE DISC DISEASE (ICD-722.6) ESOPHAGEAL STRICTURE (ICD-530.3) GASTRITIS (ICD-535.50) DUODENITIS WITHOUT MENTION OF HEMORRHAGE (ICD-535.60) COLONIC POLYPS (ICD-211.3) DIVERTICULOSIS OF COLON (ICD-562.10) ABDOMINAL AORTIC ANEURYSM (ICD-441.4) GOUT, UNSPECIFIED (ICD-274.9) RENAL CALCULUS, HX OF (ICD-V13.01) HYPERGLYCEMIA (ICD-790.29) LOW BACK PAIN, ACUTE (ICD-724.2) ALLERGIC RHINITIS WITH CONJUNCTIVITIS (ICD-477.9) NAUSEA (ICD-787.02) COPD VIA XRAY  (ICD-496)  Family History: Reviewed history from 05/24/2008 and no changes required. Father: DECEASED AT 8 YOA OF ANEURYSM  Mother: DECEASED AT 4 YOA MI  Siblings: 6 1/2 BROTHERS 1 BROTHER DECEASED OF BLOOD DISORDER:(39 YOA) 1 BROTHER DECEASED OF LUNG CANCER AT 29 YOA  1 BROTHER  DECEASED OF MI AT 62 YOA// 3 SISTERS CV: # MOTHER AND 1/2 BROTHER HBP: NEGATIVE DM: NEGATIVE GOUT/ARTHRITIS: NEGATIVE PROSTATE CANCER:  BREAST/OVARIAN/UTERINE CANCER: COLON CANCER: DEPRESSION:  ETOH/DRUG ABUSE: OTHER:  Social History: Reviewed history from 05/24/2008 and no changes required. Marital Status: Remarried 2008 LIVES WITH WIFE Children: 1 daughter in [place] Occupation: RETIRED FROM [employer] <1ppd very little alcohol  likes to travel  Review of Systems       See HPI.  Otherwise negative.    Physical Exam  General:  GEN: nad, alert and oriented HEENT: mucous membranes moist NECK: supple w/o LA CV: rrr PULM: ctab, no inc wob, no wheeze ABD: soft, +bs EXT: no edema, varicose veins noted SKIN: no acute rash but AKs noted on arms.    Impression & Recommendations:  Problem # 1:  ESSENTIAL HYPERTENSION (ICD-401.9) No change in meds.  REturn for labs.  His updated medication list for this problem includes:    Metoprolol Tartrate 100 Mg Tabs (Metoprolol tartrate) .Marland Kitchen... Take 1 tablet by mouth once a day    Capoten 50 Mg Tabs (Captopril) .Marland Kitchen... Take 1 tablet by mouth two times a day'  Problem # 2:  BPH, HX OF (DR DAVIS) (ICD-V13.8) Per Uro.   Problem # 3:  HYPERLIPIDEMIA (ICD-272.4) No change in meds.  Return for labs.  His updated medication list for this problem  includes:    Fenofibrate 160 Mg Tabs (Fenofibrate) .Marland Kitchen... 1 daily by mouth  Problem # 4:  CIGARETTE SMOKER (ICD-305.1) Not interested in quitting now.  Pt encouraged to consider.   Problem # 5:  ACTINIC KERATOSIS (ICD-702.0) Per Ronda Fairly  Problem # 6:  SPECIAL SCREENING  FOR MALIGNANT NEOPLASMS COLON (ICD-V76.51) Send home with IFOB.   Problem # 7:  GERD (ICD-530.81) D/w patient HQ:IONGEX.  Good control of previously chronic symptoms w/o side effect.  Use as needed and follow up as needed.  He agrees.  His updated medication list for this problem includes:    Protonix 40 Mg Tbec (Pantoprazole sodium) .Marland Kitchen... Take 1 tablet by mouth once a day.  Complete Medication List: 1)  Protonix 40 Mg Tbec (Pantoprazole sodium) .... Take 1 tablet by mouth once a day. 2)  Metoprolol Tartrate 100 Mg Tabs (Metoprolol tartrate) .... Take 1 tablet by mouth once a day 3)  Capoten 50 Mg Tabs (Captopril) .... Take 1 tablet by mouth two times a day' 4)  Fenofibrate 160 Mg Tabs (Fenofibrate) .Marland Kitchen.. 1 daily by mouth 5)  Reglan 5 Mg Tabs (Metoclopramide hcl) .... 1/2 tab by mouth before eating. 6)  Symbicort 160-4.5 Mcg/act Aero (Budesonide-formoterol fumarate) .... One inh two times a day  Patient Instructions: 1)  Return for fasting labs.  You can drink water or coffee (without cream or sugar) before the labs.  2)  CMET 401.1 3)  lipid 401.1 4)  Let me know when you want to quit smoking.  5)  Please schedule a follow-up appointment as needed .   Current Allergies (reviewed today): ! SULFA

## 2010-09-18 NOTE — Progress Notes (Signed)
Summary: metoclopramide  Phone Note Refill Request Message from:  Fax from Pharmacy on April 16, 2010 10:50 AM  Refills Requested: Medication #1:  REGLAN 5 MG TABS 1/2 tab by mouth before eating.   Last Refilled: 02/05/2010 Refill request from [facility]. [phone]  Initial call taken by: Melody Comas,  April 16, 2010 10:50 AM  Follow-up for Phone Call       Follow-up by: Crawford Givens MD,  April 16, 2010 12:25 PM    Prescriptions: REGLAN 5 MG TABS (METOCLOPRAMIDE HCL) 1/2 tab by mouth before eating.  #60 x 12   Entered and Authorized by:   Crawford Givens MD   Signed by:   Crawford Givens MD on 04/16/2010   Method used:   Electronically to        [facility]* (retail)       [address]       [place], [place]  [address]       Ph: [phone]       Fax: [phone]   RxID:   [id number]

## 2010-09-18 NOTE — Letter (Signed)
Summary: Results Follow up Letter  [facility] [place] [facility]  [address] [place], [place] [address]   Phone: [phone]  Fax: [phone]    04/11/2010 MRN: [id number]    [name] [address] [place], [place]  [address]    Dear [name],  The following are the results of your recent test(s):  Test         Result    Pap Smear:        Normal _____  Not Normal _____ Comments: ______________________________________________________ Cholesterol: LDL(Bad cholesterol):         Your goal is less than:         HDL (Good cholesterol):       Your goal is more than: Comments:  ______________________________________________________ Mammogram:        Normal _____  Not Normal _____ Comments:  ___________________________________________________________________ Hemoccult:        Normal __X___  Not normal _______ Comments:    _____________________________________________________________________ Other Tests:    We routinely do not discuss normal results over the telephone.  If you desire a copy of the results, or you have any questions about this information we can discuss them at your next office visit.   Sincerely,    Dwana Curd. Para March, M.D.  [facility]

## 2010-09-18 NOTE — Consult Note (Signed)
Summary: Dr.Stuart Tafeen,[facility],Note  Dr.Stuart Tafeen,[facility],Note   Imported By: Beau Fanny 10/23/2009 09:39:10  _____________________________________________________________________  External Attachment:    Type:   Image     Comment:   External Document

## 2010-09-18 NOTE — Letter (Signed)
Summary: Nadara Eaton letter  [place] [facility]  [address] [place], [place] [address]   Phone: [phone]  Fax: [phone]       03/25/2010 MRN: [id number]  NIJEE HEATWOLE [address] [place], [place]  [address]  Dear Mr. [facility] and [place] announce the retirement of Arta Silence, M.D., from full-time practice at the [facility] office effective February 13, 2010 and his plans of returning part-time.  It is important to Dr. Hetty Ely and to our practice that you understand that [facility] has seven physicians in our office for your health care needs.  We will continue to offer the same exceptional care that you have today.    Dr. Hetty Ely has spoken to many of you about his plans for retirement and returning part-time in the fall.   We will continue to work with you through the transition to schedule appointments for you in the office and meet the high standards that [facility] is committed to.   Again, it is with great pleasure that we share the news that Dr. Hetty Ely will return to [facility] in October of 2011 with a reduced schedule.    If you have any questions, or would like to request an appointment with one of our physicians, please call us at [phone] and press the option for Scheduling an appointment.  We take pleasure in providing you with excellent patient care and look forward to seeing you at your next office visit.  Our [facility] Physicians are:  Tillman Abide, M.D. Laurita Quint, M.D. Roxy Manns, M.D. Kerby Nora, M.D. Hannah Beat, M.D. Ruthe Mannan, M.D. We proudly welcomed Raechel Ache, M.D. and Eustaquio Boyden, M.D. to the practice in July/August 2011.  Sincerely,  [facility]

## 2010-09-18 NOTE — Letter (Signed)
Summary: [facility]: Immunoassay Fecal Occult Blood (iFOB) Order Form  [place] at [facility]  [address] [place], [place] [address]   Phone: [phone]  Fax: [phone]      [facility]: Immunoassay Fecal Occult Blood (iFOB) Order Form   March 27, 2010 MRN: [id number]   OTHAR CURTO [birth year]   Physicican Name:_________duncan________________  Diagnosis Code:__________v76.49________________      Crawford Givens MD

## 2010-09-18 NOTE — Progress Notes (Signed)
Summary: symbicort  Phone Note Call from Patient Call back at Home Phone [phone] Call back at [phone]   Caller: Patient Call For: Shaune Leeks MD Summary of Call: Pt was given samples of symbicort and he said this worked well for him.  He is asking for more samples or script sent to [facility]. Initial call taken by: Lowella Petties CMA,  February 05, 2010 2:32 PM  Follow-up for Phone Call        Advised pt. Follow-up by: Lowella Petties CMA,  February 06, 2010 11:05 AM    Prescriptions: SYMBICORT 160-4.5 MCG/ACT AERO (BUDESONIDE-FORMOTEROL FUMARATE) one inh two times a day  #1 MDI x 12   Entered and Authorized by:   Shaune Leeks MD   Signed by:   Shaune Leeks MD on 02/05/2010   Method used:   Electronically to        [facility]* (retail)       [address]       [place], [place]  [address]       Ph: [phone]       Fax: [phone]   RxID:   [id number]

## 2010-09-18 NOTE — Assessment & Plan Note (Signed)
Summary: FOLLOW UP / LFW   Vital Signs:  Patient profile:   77 year old male Weight:      196 pounds Temp:     98.4 degrees F oral Pulse rate:   64 / minute Pulse rhythm:   regular BP sitting:   150 / 80  (left arm) Cuff size:   regular  Vitals Entered By: Sydell Axon LPN (September 24, 2009 8:20 AM) CC: 6 Month follow-up   History of Present Illness: Pt here for 6 month followup. Still smoking and now down to less than 1 pack a day. He has had lots of congestion for last 2 mos....mostly in the lungs. He is able to move the congestion but then it comes right back.Presumably the irritation of smoking is causing this and , if lucky, can be influenced. Symbicort has helped some...was given samps and is about out.  Problems Prior to Update: 1)  Special Screening Malignant Neoplasm of Prostate  (ICD-V76.44) 2)  Bph, Hx of (DR DAVIS)  (ICD-V13.8) 3)  Hyperlipidemia  (ICD-272.4) 4)  Gallstones Via Ct  (ICD-574.20) 5)  Cigarette Smoker  (ICD-305.1) 6)  Cva  (ICD-434.91) 7)  Degenerative Disc Disease  (ICD-722.6) 8)  Esophageal Stricture  (ICD-530.3) 9)  Gastritis  (ICD-535.50) 10)  Duodenitis Without Mention of Hemorrhage  (ICD-535.60) 11)  Colonic Polyps  (ICD-211.3) 12)  Abdominal Aortic Aneurysm  (ICD-441.4) 13)  Gout, Unspecified  (ICD-274.9) 14)  Renal Calculus, Hx of  (ICD-V13.01) 15)  Hyperglycemia  (ICD-790.29) 16)  Low Back Pain, Acute  (ICD-724.2) 17)  Allergic Rhinitis With Conjunctivitis  (ICD-477.9) 18)  Nausea  (ICD-787.02) 19)  COPD Via Xray  (ICD-496)  Medications Prior to Update: 1)  Protonix 40 Mg  Tbec (Pantoprazole Sodium) .... Take 1 Tablet By Mouth Once A Day. 2)  Metoprolol Tartrate 100 Mg  Tabs (Metoprolol Tartrate) .... Take 1 Tablet By Mouth Once A Day 3)  Capoten 50 Mg  Tabs (Captopril) .... Take 1 Tablet By Mouth Two Times A Day' 4)  Fenofibrate 160 Mg Tabs (Fenofibrate) .Marland Kitchen.. 1 Daily By Mouth 5)  Reglan 5 Mg Tabs (Metoclopramide Hcl) .... 1/2 Tab  By Mouth Before Eating.  Allergies: 1)  ! Sulfa  Physical Exam  General:  Well-developed,well-nourished,in no acute distress; alert,appropriate and cooperative throughout examination Head:  Normocephalic and atraumatic without obvious abnormalities. No apparent alopecia or balding. Sinuses NT. Eyes:  Conjunctiva clear bilaterally. PERRLA, EOMI. Ears:  External ear exam shows no significant lesions or deformities.  Otoscopic examination reveals clear canals, tympanic membranes are intact bilaterally without bulging, retraction, inflammation or discharge. Hearing is grossly normal bilaterally. Nose:  External nasal examination shows no deformity or inflammation. Nasal mucosa are pink and moist without lesions or exudates. Mouth:  Oral mucosa and oropharynx without lesions or exudates.  Teeth in good repair. Neck:  No deformities, masses, or tenderness noted. Lungs:  Normal respiratory effort, chest expands symmetrically. Lungs are clear to auscultation, no crackles but mild fine inspiratory  wheezes. Also today has some congestion LLL > RLL which clears with cough. Heart:  Normal rate and regular rhythm. S1 and S2 normal without gallop, murmur, click, rub or other extra sounds.   Impression & Recommendations:  Problem # 1:  CIGARETTE SMOKER (ICD-305.1) Assessment Improved Encouraged top keep cutting down. Explained to him that he is causing continued inflammation of the lung tissue which will slowly destabilize.  Problem # 2:  COPD VIA XRAY (ICD-496) Assessment: Unchanged  Will slowly worsen.  Cont meds but quitting smoking most important intervention. His updated medication list for this problem includes:    Symbicort 160-4.5 Mcg/act Aero (Budesonide-formoterol fumarate) ..... One inh two times a day  Pulmonary Functions Reviewed: O2 sat: 95 (03/06/2009)     Vaccines Reviewed: Pneumovax: Pneumovax (04/17/2002)   Flu Vax: Fluvax MCR (05/10/2008)  Problem # 3:  CVA  (ICD-434.91) Assessment: Unchanged Tried to impress that this is another risk of smoking.  Problem # 4:  ESSENTIAL HYPERTENSION (ICD-401.9) Assessment: New  On two meds for other reasons which still have him slightly uncontrolled with regard to BP. Will follow. Cont curr meds. His updated medication list for this problem includes:    Metoprolol Tartrate 100 Mg Tabs (Metoprolol tartrate) .Marland Kitchen... Take 1 tablet by mouth once a day    Capoten 50 Mg Tabs (Captopril) .Marland Kitchen... Take 1 tablet by mouth two times a day'  BP today: 150/80 Prior BP: 120/60 (03/21/2009)  Labs Reviewed: K+: 4.4 (03/15/2009) Creat: : 1.5 (03/15/2009)   Chol: 202 (03/15/2009)   HDL: 32.00 (03/15/2009)   TG: 162.0 (03/15/2009)  Problem # 5:  URI (ICD-465.9) Assessment: New Congestion of the lungs. Try guaifenesin to help clear.  Complete Medication List: 1)  Protonix 40 Mg Tbec (Pantoprazole sodium) .... Take 1 tablet by mouth once a day. 2)  Metoprolol Tartrate 100 Mg Tabs (Metoprolol tartrate) .... Take 1 tablet by mouth once a day 3)  Capoten 50 Mg Tabs (Captopril) .... Take 1 tablet by mouth two times a day' 4)  Fenofibrate 160 Mg Tabs (Fenofibrate) .Marland Kitchen.. 1 daily by mouth 5)  Reglan 5 Mg Tabs (Metoclopramide hcl) .... 1/2 tab by mouth before eating. 6)  Symbicort 160-4.5 Mcg/act Aero (Budesonide-formoterol fumarate) .... One inh two times a day  Patient Instructions: 1)  Take Guaifenesin by going to [facility] or [facility] and getting MUCOUS RELIEF EXPECTORANT (400mg ), take 11/2 tabs by mouth AM and NOON. 2)  Drink lots of fluids anytime taking Guaifenesin.   Current Allergies (reviewed today): ! SULFA

## 2010-09-19 ENCOUNTER — Encounter: Payer: Self-pay | Admitting: Family Medicine

## 2010-09-19 ENCOUNTER — Ambulatory Visit (INDEPENDENT_AMBULATORY_CARE_PROVIDER_SITE_OTHER): Payer: Medicare Other | Admitting: Family Medicine

## 2010-09-19 DIAGNOSIS — J310 Chronic rhinitis: Secondary | ICD-10-CM

## 2010-09-24 NOTE — Assessment & Plan Note (Signed)
Summary: ??sinus infection/alc   Vital Signs:  Patient profile:   77 year old male Height:      74 inches Weight:      195 pounds BMI:     25.13 Temp:     98.5 degrees F oral Pulse rate:   84 / minute Pulse rhythm:   regular BP sitting:   136 / 56  (left arm) Cuff size:   regular  Vitals Entered By: Delilah Shan CMA Eliam Snapp Dull) (September 19, 2010 3:44 PM) CC: ? sinus infection   History of Present Illness: Sinus congestion.  Some cough "trying to get some of that stuff out of there [throat]."  Sx started about 3 weeks ago.  Cough at night, but not every night. No fevers.  Some post nasal gtt.  Rhinorrhea, clear.  No NVD.  He doesn't normally have symptoms like this every year.  Taking otc meds w/o much relief.   Robitussen helps some with cough but not the drainage. I talked to him about smoking.  He is cutting down.   Allergies: 1)  ! Sulfa 2)  ! Aspirin  Physical Exam  General:  no apparent distress normocephalic atraumatic tm wnl clear rhinorrhea w/o injection sinuses not tender to palpation op wnl neck supple regular rate and rhythm clear to auscultation bilaterally   Impression & Recommendations:  Problem # 1:  RHINITIS (ICD-472.0) Start flonase.  This doesn't appear to be infectious.   Use nasal saline and follow up as needed.  He agrees.  Needs to cut out smoking.  Orders: Prescription Created Electronically [phone])  Complete Medication List: 1)  Protonix 40 Mg Tbec (Pantoprazole sodium) .... Take 1 tablet by mouth once a day. 2)  Metoprolol Tartrate 100 Mg Tabs (Metoprolol tartrate) .... Take 1 tablet by mouth once a day 3)  Capoten 50 Mg Tabs (Captopril) .... Take 1 tablet by mouth two times a day' 4)  Fenofibrate 160 Mg Tabs (Fenofibrate) .Marland Kitchen.. 1 daily by mouth 5)  Reglan 5 Mg Tabs (Metoclopramide hcl) .... 1/2 tab by mouth before eating. 6)  Symbicort 160-4.5 Mcg/act Aero (Budesonide-formoterol fumarate) .... One inh two times a day 7)  Flonase 50 Mcg/act  Susp (Fluticasone propionate) .... 2 sprays per nostril per day.  Patient Instructions: 1)  Start the steroid nose spray and also use nasal saline to flush out your nose.  Let me know if you aren't gradually improving.  Try to work on stopping smoking.  Take care.  Glad to see you.  Prescriptions: FLONASE 50 MCG/ACT SUSP (FLUTICASONE PROPIONATE) 2 sprays per nostril per day.  #1 x 2   Entered and Authorized by:   Crawford Givens MD   Signed by:   Crawford Givens MD on 09/19/2010   Method used:   Electronically to        [facility]* (retail)       [address]       [place], [place]  [address]       Ph: [phone]       Fax: [phone]   RxID:   [id number]    Orders Added: 1)  Prescription Created Electronically [G8553] 2)  Est. Patient Level III [53664]    Current Allergies (reviewed today): ! SULFA ! ASPIRIN

## 2010-09-28 ENCOUNTER — Encounter: Payer: Self-pay | Admitting: Family Medicine

## 2010-10-08 NOTE — Miscellaneous (Signed)
   Clinical Lists Changes  Observations: Added new observation of PAST MED HX: Current Problems:  BPH, HX OF (DR DAVIS) (ICD-V13.8) HYPERLIPIDEMIA (ICD-272.4) GALLSTONES  VIA CT (ICD-574.20) CIGARETTE SMOKER (ICD-305.1) CVA (ICD-434.91) DEGENERATIVE DISC DISEASE (ICD-722.6) ESOPHAGEAL STRICTURE (ICD-530.3) GASTRITIS (ICD-535.50) DUODENITIS WITHOUT MENTION OF HEMORRHAGE (ICD-535.60) COLONIC POLYPS (ICD-211.3) DIVERTICULOSIS OF COLON (ICD-562.10) ABDOMINAL AORTIC ANEURYSM (ICD-441.4) followed by [facility] (Dr. Darrick Penna) GOUT, UNSPECIFIED (ICD-274.9) RENAL CALCULUS, HX OF (ICD-V13.01) HYPERGLYCEMIA (ICD-790.29) LOW BACK PAIN, ACUTE (ICD-724.2) ALLERGIC RHINITIS WITH CONJUNCTIVITIS (ICD-477.9) NAUSEA (ICD-787.02) COPD VIA XRAY (ICD-496) Carotid disease on u/s- 60-80% L ICA and <40% R ICA (08/2010) (09/28/2010 21:06)      Past History:  Past Medical History: Current Problems:  BPH, HX OF (DR DAVIS) (ICD-V13.8) HYPERLIPIDEMIA (ICD-272.4) GALLSTONES  VIA CT (ICD-574.20) CIGARETTE SMOKER (ICD-305.1) CVA (ICD-434.91) DEGENERATIVE DISC DISEASE (ICD-722.6) ESOPHAGEAL STRICTURE (ICD-530.3) GASTRITIS (ICD-535.50) DUODENITIS WITHOUT MENTION OF HEMORRHAGE (ICD-535.60) COLONIC POLYPS (ICD-211.3) DIVERTICULOSIS OF COLON (ICD-562.10) ABDOMINAL AORTIC ANEURYSM (ICD-441.4) followed by [facility] (Dr. Darrick Penna) GOUT, UNSPECIFIED (ICD-274.9) RENAL CALCULUS, HX OF (ICD-V13.01) HYPERGLYCEMIA (ICD-790.29) LOW BACK PAIN, ACUTE (ICD-724.2) ALLERGIC RHINITIS WITH CONJUNCTIVITIS (ICD-477.9) NAUSEA (ICD-787.02) COPD VIA XRAY (ICD-496) Carotid disease on u/s- 60-80% L ICA and <40% R ICA (08/2010)  Appended Document:      Clinical Lists Changes

## 2010-12-08 ENCOUNTER — Encounter: Payer: Self-pay | Admitting: Family Medicine

## 2010-12-08 ENCOUNTER — Ambulatory Visit (INDEPENDENT_AMBULATORY_CARE_PROVIDER_SITE_OTHER): Payer: Medicare Other | Admitting: Family Medicine

## 2010-12-08 ENCOUNTER — Telehealth: Payer: Self-pay | Admitting: *Deleted

## 2010-12-08 VITALS — BP 160/80 | HR 84 | Temp 98.3°F | Wt 195.0 lb

## 2010-12-08 DIAGNOSIS — L97509 Non-pressure chronic ulcer of other part of unspecified foot with unspecified severity: Secondary | ICD-10-CM | POA: Insufficient documentation

## 2010-12-08 NOTE — Telephone Encounter (Signed)
Mr. Frimpong refused the postop shoe and said he could do better with his tennis shoe on.  Please do not charge him for the shoe.

## 2010-12-08 NOTE — Assessment & Plan Note (Addendum)
I d/w Dr. Darrick Penna and I appreciate vascular help.  I am concerned for progressive claudication in patient who is still smoking.  I talked to him about stopping smoking today.  >25 min spent with face to face with patient counseling and coordinating care. He'll fu with [facility] on Thursday and they'll call him about the appointment.  Toe didn't appear actively infected.  I didn't rx abx.  Toe was wrapped in thin gauze; he declined post op shoe.

## 2010-12-08 NOTE — Progress Notes (Signed)
1 week of foot pain. "I thought I had gout."  Pain is on L 4th toe, no other foot pain. Pain is about the same over the past week.  He does have h/o gout.  No injury.  No FCNAV.  Pain came on quickly but has relented.  He cut down to 1/2 PPD smoking.    Cramping in L leg with walking, better with rest.  Overall getting worse, going on for about 1 year. No CP.  Meds, vitals, and allergies reviewed.   ROS: See HPI.  Otherwise, noncontributory.  nad rrr No ankle edema and calf not ttp Faint but equal DP pulse bilaterally L foot not ttp except at shallow blister site that doesn't probe to bone on plantar side distal of L 4th toe.  The toe isn't ttp o/w.  No purulent discharge, but skin proximal to the ulcer and between the toes is irritated.

## 2010-12-08 NOTE — Telephone Encounter (Signed)
I didn't charge for the shoe.

## 2010-12-08 NOTE — Patient Instructions (Addendum)
Wear the post op shoe to protect your foot.  Dr. Darrick Penna' office will call you about coming to their office on Thursday.

## 2010-12-09 ENCOUNTER — Telehealth: Payer: Self-pay | Admitting: *Deleted

## 2010-12-09 NOTE — Telephone Encounter (Signed)
Message copied by Delilah Shan on Tue Dec 09, 2010  8:17 AM ------      Message from: Raechel Ache      Created: Mon Dec 08, 2010 11:50 PM       Please send note to Dr. Darrick Penna with vasc surgery.  Thanks.

## 2010-12-09 NOTE — Telephone Encounter (Signed)
Faxed information to Dr. Darrick Penna with notation of appt. On Thursday, December 11, 2010

## 2010-12-11 ENCOUNTER — Encounter (INDEPENDENT_AMBULATORY_CARE_PROVIDER_SITE_OTHER): Payer: Medicare Other

## 2010-12-11 ENCOUNTER — Encounter (INDEPENDENT_AMBULATORY_CARE_PROVIDER_SITE_OTHER): Payer: Medicare Other | Admitting: Vascular Surgery

## 2010-12-11 DIAGNOSIS — I739 Peripheral vascular disease, unspecified: Secondary | ICD-10-CM

## 2010-12-12 NOTE — Assessment & Plan Note (Signed)
OFFICE VISIT  [name], NAY DOB:  08/19/1932                                       12/11/2010 [id number]#:[id number]  CHIEF COMPLAINT:  Left foot pain.  HISTORY OF PRESENT ILLNESS:  The patient is well-known to our practice from previous aneurysm stent graft repair by Dr. Madilyn Fireman in 2008.  He also has history of moderate carotid disease.  He was last seen in January of 2012.  Since that time over the last few weeks he began to develop pain in the left fourth toe.  He says it has become so painful that it keeps him from sleeping at night time.  He also has a tight sensation in his left calf which occurs after walking five to six blocks.  He says this has now been present for several months.  CHRONIC MEDICAL PROBLEMS:  Continue to remain hypertension which is followed by Dr. Para March and currently is controlled.  REVIEW OF SYSTEMS:  He denies any shortness of breath or chest pain.  MEDICATIONS: 1. Metoprolol 1 mg once a day. 2. Protonix 40 mg once a day. 3. Captopril 50 mg twice a day. 4. Fenofibrate 160 mg once a day.  ALLERGIES:  He is allergic to sulfa medications.  PHYSICAL EXAM:  Vital signs:  Blood pressure is 175/93 in the right arm, 174/110 in the left arm, heart rate 64 and regular.  Temperature is 97.7.  HEENT:  Unremarkable.  Neck:  Has 2+ carotid pulses without bruit.  Chest:  Clear to auscultation.  Cardiac:  Regular rate and rhythm without murmur.  Abdomen:  Soft, nontender, nondistended.  No pulsatile mass.  He has 2+ femoral pulses bilaterally.  He has absent popliteal and pedal pulses bilaterally.  The left fourth toe is dusky in appearance.  There are no open ulcerations in the skin.  Neurologic:  He has symmetric upper extremity and lower extremity motor strength which is 5/5.  He had bilateral ABIs performed today which were 0.49 on the left with monophasic waveforms and they were 0.89 on the right.  In summary, the patient is  developing progressive ischemia of his left lower extremity with essentially rest pain in his left fourth toe and ABIs that correlate with this as well as physical exam.  I believe the best option for him would be arteriogram, bilateral lower extremity runoff, possible angioplasty and stenting of his left lower extremity or consideration for a bypass.  We have scheduled this for 12/19/2010.  The risks, benefits, possible complications and procedure details were discussed with the patient today as well as his wife.  These include but not limited to bleeding, infection, vessel injury, contrast reaction. They understand and agree to proceed.    Perry Hora. Shain Pauwels, MD Electronically Signed  CEF/MEDQ  D:  12/11/2010  T:  12/12/2010  Job:  [id number]  cc:   Dwana Curd. Para March, M.D.

## 2010-12-18 ENCOUNTER — Ambulatory Visit (INDEPENDENT_AMBULATORY_CARE_PROVIDER_SITE_OTHER): Payer: Medicare Other | Admitting: Vascular Surgery

## 2010-12-18 DIAGNOSIS — I739 Peripheral vascular disease, unspecified: Secondary | ICD-10-CM

## 2010-12-19 ENCOUNTER — Inpatient Hospital Stay (HOSPITAL_COMMUNITY)
Admission: RE | Admit: 2010-12-19 | Discharge: 2010-12-25 | DRG: 253 | Disposition: A | Discharge status: Home / Self Care | Payer: Medicare Other | Source: Ambulatory Visit | Attending: Vascular Surgery | Admitting: Vascular Surgery

## 2010-12-19 ENCOUNTER — Inpatient Hospital Stay (HOSPITAL_COMMUNITY): Payer: Medicare Other

## 2010-12-19 DIAGNOSIS — I472 Ventricular tachycardia, unspecified: Secondary | ICD-10-CM

## 2010-12-19 DIAGNOSIS — J4489 Other specified chronic obstructive pulmonary disease: Secondary | ICD-10-CM | POA: Diagnosis present

## 2010-12-19 DIAGNOSIS — N400 Enlarged prostate without lower urinary tract symptoms: Secondary | ICD-10-CM | POA: Diagnosis present

## 2010-12-19 DIAGNOSIS — Z8673 Personal history of transient ischemic attack (TIA), and cerebral infarction without residual deficits: Secondary | ICD-10-CM

## 2010-12-19 DIAGNOSIS — J449 Chronic obstructive pulmonary disease, unspecified: Secondary | ICD-10-CM | POA: Diagnosis present

## 2010-12-19 DIAGNOSIS — L02619 Cutaneous abscess of unspecified foot: Secondary | ICD-10-CM | POA: Diagnosis not present

## 2010-12-19 DIAGNOSIS — I10 Essential (primary) hypertension: Secondary | ICD-10-CM | POA: Diagnosis present

## 2010-12-19 DIAGNOSIS — I739 Peripheral vascular disease, unspecified: Secondary | ICD-10-CM

## 2010-12-19 DIAGNOSIS — L98499 Non-pressure chronic ulcer of skin of other sites with unspecified severity: Secondary | ICD-10-CM

## 2010-12-19 DIAGNOSIS — L03039 Cellulitis of unspecified toe: Secondary | ICD-10-CM | POA: Diagnosis not present

## 2010-12-19 DIAGNOSIS — I70209 Unspecified atherosclerosis of native arteries of extremities, unspecified extremity: Principal | ICD-10-CM | POA: Diagnosis present

## 2010-12-19 DIAGNOSIS — I4729 Other ventricular tachycardia: Secondary | ICD-10-CM | POA: Diagnosis present

## 2010-12-19 DIAGNOSIS — E785 Hyperlipidemia, unspecified: Secondary | ICD-10-CM | POA: Diagnosis present

## 2010-12-19 DIAGNOSIS — Z01810 Encounter for preprocedural cardiovascular examination: Secondary | ICD-10-CM

## 2010-12-19 LAB — CBC
HCT: 41.8 % (ref 39.0–52.0)
Hemoglobin: 14 g/dL (ref 13.0–17.0)
MCH: 31.6 pg (ref 26.0–34.0)
MCV: 94.4 fL (ref 78.0–100.0)
Platelets: 227 10*3/uL (ref 150–400)
RBC: 4.43 MIL/uL (ref 4.22–5.81)
WBC: 8.7 10*3/uL (ref 4.0–10.5)

## 2010-12-19 LAB — POCT I-STAT, CHEM 8
BUN: 20 mg/dL (ref 6–23)
Chloride: 105 mEq/L (ref 96–112)
Creatinine, Ser: 1.5 mg/dL (ref 0.4–1.5)
Potassium: 4.1 mEq/L (ref 3.5–5.1)
Sodium: 139 mEq/L (ref 135–145)

## 2010-12-19 LAB — CARDIAC PANEL(CRET KIN+CKTOT+MB+TROPI)
CK, MB: 1.4 ng/mL (ref 0.3–4.0)
Relative Index: INVALID (ref 0.0–2.5)
Total CK: 25 U/L (ref 7–232)
Troponin I: <0.3 ng/mL (ref <0.30)

## 2010-12-19 LAB — COMPREHENSIVE METABOLIC PANEL
ALT: 8 U/L (ref 0–53)
AST: 13 U/L (ref 0–37)
CO2: 28 mEq/L (ref 19–32)
Calcium: 9.7 mg/dL (ref 8.4–10.5)
Creatinine, Ser: 1.06 mg/dL (ref 0.4–1.5)
GFR calc Af Amer: >60 mL/min (ref >60)
GFR calc non Af Amer: >60 mL/min (ref >60)
Sodium: 137 mEq/L (ref 135–145)
Total Protein: 7.4 g/dL (ref 6.0–8.3)

## 2010-12-19 LAB — VITAMIN B12: Vitamin B-12: 666 pg/mL (ref 211–911)

## 2010-12-19 LAB — LIPID PANEL
HDL: 36 mg/dL — ABNORMAL LOW (ref >39)
Total CHOL/HDL Ratio: 4.8 RATIO
Triglycerides: 160 mg/dL — ABNORMAL HIGH (ref <150)

## 2010-12-19 LAB — TSH: TSH: 0.497 u[IU]/mL (ref 0.350–4.500)

## 2010-12-19 NOTE — Assessment & Plan Note (Signed)
OFFICE VISIT  AEON, KESSNER DOB:  1932-11-12                                       12/18/2010 [id number]#:[id number]  The patient returns for followup today.  He is scheduled for an arteriogram tomorrow to evaluate his left lower extremity.  He was last seen on 12/11/2010.  He has an ulceration on the medial aspect of the left fourth toe.  His wife was concerned that this was more red and had been progressively enlarging.  The patient has rest pain in the left foot.  This is unchanged from previously.  He takes 1-2 Percocet today for pain control.  He has no fever or chills.  On physical exam today blood pressure is 183/77 in the left arm, heart rate 61 and regular.  Left foot is cool but unchanged from his previous office visit.  There is a 2 cm ulceration on medial aspect of the fourth toe which is tender to palpation.  I discussed with the patient and his wife today that he is at risk of losing the fourth toe but we would proceed as planned with an arteriogram tomorrow and possibly angioplasty and stenting, if not consideration would be given to doing a bypass on Monday May 7 if required.  All this was discussed with the patient and his family today and they understand and agree to proceed.  Will place a dry gauze dressing on the ulcer currently.  The patient was also told to stop applying hydrogen peroxide to the wound.    Janetta Hora. Fields, MD Electronically Signed  CEF/MEDQ  D:  12/18/2010  T:  12/19/2010  Job:  [id number]

## 2010-12-20 ENCOUNTER — Inpatient Hospital Stay (HOSPITAL_COMMUNITY): Payer: Medicare Other

## 2010-12-20 DIAGNOSIS — I471 Supraventricular tachycardia, unspecified: Secondary | ICD-10-CM

## 2010-12-20 LAB — CBC
HCT: 40 % (ref 39.0–52.0)
Hemoglobin: 13.2 g/dL (ref 13.0–17.0)
MCH: 30.7 pg (ref 26.0–34.0)
MCHC: 33 g/dL (ref 30.0–36.0)
RDW: 13.4 % (ref 11.5–15.5)

## 2010-12-20 LAB — BASIC METABOLIC PANEL
CO2: 28 mEq/L (ref 19–32)
Calcium: 9.8 mg/dL (ref 8.4–10.5)
Creatinine, Ser: 1.11 mg/dL (ref 0.4–1.5)
GFR calc non Af Amer: >60 mL/min (ref >60)
Glucose, Bld: 88 mg/dL (ref 70–99)
Sodium: 136 mEq/L (ref 135–145)

## 2010-12-20 MED ORDER — TECHNETIUM TC 99M TETROFOSMIN IV KIT
10.0000 | PACK | Freq: Once | INTRAVENOUS | Status: AC | PRN
  Administered 2010-12-20: 10 via INTRAVENOUS

## 2010-12-20 MED ORDER — TECHNETIUM TC 99M TETROFOSMIN IV KIT
30.0000 | PACK | Freq: Once | INTRAVENOUS | Status: AC | PRN
  Administered 2010-12-20: 30 via INTRAVENOUS

## 2010-12-21 ENCOUNTER — Other Ambulatory Visit (HOSPITAL_COMMUNITY): Payer: Medicare Other

## 2010-12-22 ENCOUNTER — Encounter: Payer: Self-pay | Admitting: Family Medicine

## 2010-12-22 ENCOUNTER — Inpatient Hospital Stay (HOSPITAL_COMMUNITY): Payer: Medicare Other

## 2010-12-22 DIAGNOSIS — N400 Enlarged prostate without lower urinary tract symptoms: Secondary | ICD-10-CM | POA: Insufficient documentation

## 2010-12-22 DIAGNOSIS — I743 Embolism and thrombosis of arteries of the lower extremities: Secondary | ICD-10-CM

## 2010-12-22 NOTE — Op Note (Signed)
NAME:  [name], [name]               ACCOUNT NO.:  [id number]  MEDICAL RECORD NO.:  [id number]           PATIENT TYPE:  I  LOCATION:  3733                         FACILITY:  [facility]  PHYSICIAN:  Janetta Hora. Fields, MD  DATE OF BIRTH:  1932-11-05  DATE OF PROCEDURE:  12/19/2010 DATE OF DISCHARGE:                              OPERATIVE REPORT   PROCEDURE:  Aortogram with left lower extremity runoff.  PREOPERATIVE DIAGNOSIS:  Nonhealing wound and ischemia, left foot.  POSTOPERATIVE DIAGNOSIS:  Nonhealing wound and ischemia, left foot.  ANESTHESIA:  Local with IV sedation.  OPERATIVE DETAILS:  After obtaining informed consent, the patient was taken to the [facility] lab.  Both groins were prepped and draped in usual sterile fashion.  The patient was placed in supine position on the angio table.  Next, local anesthesia was infiltrated over the right common femoral artery.  An introducer needle was used to cannulate the right common femoral artery and a 0.03 Versacore wire threaded up in the abdominal aorta under fluoroscopic guidance.  A 5-French sheath was placed over the guidewire in the right common femoral artery and this was thoroughly flushed with heparinized saline.  The 5-French pigtail catheter was then placed over the guidewire and abdominal aortogram was obtained.  This shows patent left and right renal arteries.  There is an Endologix aneurysm stent graft in position, which is approximately 1-2 cm below the takeoff of the renal arteries.  The left renal artery is the lowest.  Stent graft is patent throughout its course.  There is no evidence of type 1 or type 2 endoleak.  The left and right common iliac and external iliac arteries are patent.  The left and right internal iliac arteries are patent.  At this point, the pigtail catheter was pulled back over a guidewire and a 5-French crossover catheter brought up in the operative field.  This was used to selectively catheterize the  left common iliac artery.  An 0.35 angled Glidewire was then brought up in the operative field and this was advanced down to the left common iliac system then into the distal left external iliac artery.  The crossover catheter was then exchanged for a 5-French straight catheter and left lower extremity arteriogram was obtained.  This shows the left common femoral artery is patent.  The left profunda femoris artery is patent.  The left superficial femoral artery occludes in its midportion.  The above-knee popliteal artery then reconstitutes via profunda branches.  The below- knee popliteal artery is patent.  There is one-vessel runoff to the left foot via the peroneal artery.  On lateral foot view, the peroneal artery gives off a posterior communicating branch, which reconstitutes the posterior tibial artery which is the primary filling vessel for the entire left foot.  The plantar arch is incomplete.  Due to the fact that the patient only had single-vessel runoff, I elected not to intervene with the superficial femoral artery stent.  At this point, a 5-French sheath was left in place to be pulled in the holding area.  The patient will be scheduled for a left femoral to above- knee  popliteal bypass early next week.  The patient has tolerated the procedure well and there were no complications.  The patient was taken to the holding area in a stable condition.  OPERATIVE FINDINGS: 1. Endologix aneurysm stent graft patent with no evidence of type 1 or     type 2 endoleak. 2. Left superficial femoral artery occlusion with reconstitution of     the above-knee popliteal artery and one-vessel runoff via the     peroneal artery to the left foot filling the posterior tibial     artery at the ankle with incomplete plantar arch.     Janetta Hora. Fields, MD     CEF/MEDQ  D:  12/19/2010  T:  12/20/2010  Job:  [id number]  Electronically Signed by Fabienne Bruns MD on 12/22/2010 08:00:40 AM

## 2010-12-23 DIAGNOSIS — Z01810 Encounter for preprocedural cardiovascular examination: Secondary | ICD-10-CM

## 2010-12-23 LAB — CBC
MCH: 30.9 pg (ref 26.0–34.0)
MCHC: 32.8 g/dL (ref 30.0–36.0)
MCV: 94.3 fL (ref 78.0–100.0)
Platelets: 205 10*3/uL (ref 150–400)
RDW: 13 % (ref 11.5–15.5)

## 2010-12-23 LAB — BASIC METABOLIC PANEL
BUN: 21 mg/dL (ref 6–23)
Calcium: 8.9 mg/dL (ref 8.4–10.5)
Creatinine, Ser: 1.45 mg/dL (ref 0.4–1.5)
GFR calc Af Amer: 57 mL/min — ABNORMAL LOW (ref >60)
GFR calc non Af Amer: 47 mL/min — ABNORMAL LOW (ref >60)

## 2010-12-29 NOTE — Consult Note (Signed)
NAME:  [name], [name] NO.:  [id number]  MEDICAL RECORD NO.:  [id number]           PATIENT TYPE:  I  LOCATION:  3733                         FACILITY:  [facility]  PHYSICIAN:  [name], M.D.  DATE OF BIRTH:  Nov 19, 1932  DATE OF CONSULTATION: DATE OF DISCHARGE:                                CONSULTATION   CARDIOLOGIST:  Perry M. Swaziland, MD  PRIMARY MD:  [name] with [facility].  REASON FOR CONSULT:  Preop clearance for peripheral vascular angioplasty.  HISTORY OF PRESENT ILLNESS:  [name] is a 77 year old white man with a 50-pack-year history of smoking and triple AAA repair with an endograft in 2008, developed left lower extremity fourth toe pain 2 weeks prior to admission, that got progressively worse with radiculopathy of his left calf.  The patient was evaluated by his PCP, [name] at the clinic. ABIs were performed which were 0.49 on the left lower extremity and 0.89 on the right lower extremity.  The patient was referred to [name] who scheduled the patient for an arteriogram and possible angioplasty on the day of admission.  However, during the angiogram, the patient developed 6 runs of VTs.  Therefore, angioplasty was postponed and Cardiology consult was requested.  At the time of examination, the patient denied any symptoms of TIA, amaurosis fugax, chest pain, shortness of breath, abdominal pain, fever, chills, orthopnea.  ALLERGIES:  The patient is intolerant to SULFA that makes him "sick to his stomach."  HOME MEDICATIONS: 1. Metoprolol 100 mg 1 p.o. daily. 2. Captopril 50 mg 1 p.o. b.i.d. 3. Fenofibrate 160 mg 1 p.o. daily. 4. Symbicort 160/4.5 one puff b.i.d.  PAST MEDICAL HISTORY: 1. Negative for prior cardiac catheterization or CABG.  No previous     echocardiograms available.  Per the patient's report, the patient     had a stress echo done in the past, however, records are not     available at this time. 2. AAA with  endograft stenting in 2008, by [name].  Last vascular     evaluation was done in January 2012, that demonstrated patent     endograft and no changes in the size of aneurysmal sac at 3.2 cm. 3. History of smoking. 4. Hypertension. 5. History of TIA in 2008. 6. History of moderate carotid stenosis with 60-99% stenosis of the     left ICA and 20-40% stenosis of the right ICA. 7. History of bilateral inguinal hernia repair approximately in the     year 2000. 8. BPH with a history of prostatitis. 9. History of nephrolithiasis. 10.History of asymptomatic cholelithiasis.  SOCIAL HISTORY:  The patient is married, has 1 child from a first marriage.  The patient is a retired, used to work for [name].  The patient has a 50-pack-year history of smoking.  He drinks 1 shot of whisky per month.  He denies any illicit drug use.  The patient states that he is physically active by doing gardening.  FAMILY MEDICAL HISTORY:  Mother with vascular disease at age of 75 years old.  Father passed away at age of 65  years old from "aneurysm."  REVIEW OF SYSTEMS:  The patient denies any fever, chills, sweats, weight changes, or adenopathy.  He denies any headaches, any vertigo, photophobia, vision or hearing deficits.  He denies any rash or lesions. He denies any chest pain, shortness of breath, orthopnea, PND, edema, palpitations, or wheezing.  The patient denies any frequency, urgency, dysuria, or straining.  He denies any weakness.  He endorses left toe numbness and pain.  He denies any changes on the bowel habits.  No polyuria, polydipsia, or temperature intolerance.  PHYSICAL EXAMINATION:  VITAL SIGNS:  Temperature of 97.5, pulse rate of 64 sinus rhythm, respiratory rate of 17, blood pressure at the time of admission of 206/86, currently 168/76, saturation 94% on room air, weight 86 kg. GENERAL:  The patient is alert and oriented x3, in no apparent distress with somewhat irritable affect  postanesthesia. HEENT:  Head is without any palpable abnormalities.  EOMs are intact bilaterally.  PERRLA bilaterally.  Sclerae are clear bilaterally.  No conjunctival injections bilaterally.  Oropharynx is without any injections or exudates or lesions. NECK:  Supple.  No JVD or thyromegaly bilaterally.  Lymphadenopathy none. CARDIOVASCULAR:  Regular rate and rhythm.  No S3 or S4.  No murmurs or rubs. PERIPHERAL VASCULAR:  Left fourth toe cyanosis with a small ulcerated area on the plantar surface of the fourth toe, but without any exudate. Pedal pulses are faint bilaterally. PULMONARY:  Clear to auscultation bilaterally with small expiratory wheezes to upper lobes bilaterally. SKIN:  No rashes. ABDOMEN:  Nondistended, nontender to palpation.  No rebound.  No pulsatile masses appreciated. MUSCULOSKELETAL:  No joint effusion or spinal tenderness bilaterally. NEUROLOGIC:  Grossly intact.  LABORATORY DATA:  Creatinine of 1.5 per i-STAT.  EKG demonstrates regular rhythm, sinus with a rate of 65 beats per minute, normal axis, and RBBB pattern.  ASSESSMENT AND PLAN: 1. Periprocedural 6 runs of ventricular tachycardias without known     history of coronary artery disease.  However, the patient is with     multiple risk factors with Framingham cardiovascular score of 75%     and with an Euro score of 25% of perioperative possible     complications.  Case discussed with an attending, Dr. Swaziland.  The     plan is to go ahead and do a full workup for further     stratification.  We will order Myoview Lexiscan test.  We also will     check TSH, CBC, CMET, UDS, and fasting lipid panel.  We will repeat     EKG in the morning as well as the chest x-ray.  Recommendations is     to continue with metoprolol and captopril and add aspirin and     statin. 2. Progressive ischemia of the left lower extremity status post     arteriogram with a possible angioplasty on Dec 22, 2010.  We will     follow  recommendations per [name]. 3. Hypertension.  The patient needs a tighter control.  He is     currently on metoprolol and captopril.  We will reevaluate his     blood pressures in the morning.  He might need modification of his     regimen. 4. Chronic obstructive pulmonary disease.  Continue with the     Symbicort.  The patient most likely will need PFTs     in the near future. 5. Hyperlipidemia.  The patient is on fenofibrate.  It is unclear why  the reason for the patient is not being treated with a statin.  We     will check a fasting lipid panel.     Deatra Robinson, MD   ______________________________ [name], M.D.    NK/MEDQ  D:  12/19/2010  T:  12/19/2010  Job:  [id number]  Electronically Signed by Deatra Robinson  on 12/21/2010 11:54:16 Perry Electronically Signed by [name] M.D. on 12/29/2010 09:40:31 Perry

## 2010-12-30 NOTE — Procedures (Signed)
ENDOVASCULAR STENT GRAFT EXAM   INDICATION:  Follow-up of abdominal aortic aneurysm repair.   HISTORY:                           DUPLEX EVALUATION   AAA Sac Size:                4.03 CM AP            3.6 CM TRV  Previous Sac Size:           3.6 CM AP             4.17 CM TRV  Evidence of an endoleak?     No   Velocity Criteria:  Proximal Aorta               91 cm/sec  Proximal Stent Graft         57 cm/sec  Main Body Stent Graft-Mid    53 cm/sec  Right Limb-Proximal          75 cm/sec  Right Limb-Distal            69 cm/sec  Left Limb-Proximal           55 cm/sec  Left Limb-Distal             136 cm/sec  Patent Renal Arteries ?    Right  EIA:  Distal 123, mid 35, proximal 50  CFA : 75    Yes    IMPRESSION:  1. Patent endograft with no evidence of leak.  2. Patent right EIA  interposition graft with no evidence of stenosis.  3. Right ABI suggests mild disease.  4. Left ABI suggests moderate disease.   ___________________________________________  Janetta Hora. Fields, MD   CJ/MEDQ  D:  06/26/2009  T:  06/26/2009  Job:  [id number]

## 2010-12-30 NOTE — Procedures (Signed)
CAROTID DUPLEX EXAM   INDICATION:  Followup of known carotid artery disease.  The patient is  asymptomatic.   HISTORY:  Diabetes:  No.  Cardiac:  No.  Hypertension:  Yes.  Smoking:  No.  Previous Surgery:  AAA repair (Endograft).  CV History:  Amaurosis Fugax No, Paresthesias No, Hemiparesis No                                       RIGHT             LEFT  Brachial systolic pressure:         186               180  Brachial Doppler waveforms:         WNL               WNL  Vertebral direction of flow:        Antegrade         Antegrade  DUPLEX VELOCITIES (cm/sec)  CCA peak systolic                   52                52  ECA peak systolic                   160               140  ICA peak systolic                   120               283  ICA end diastolic                   29                66  PLAQUE MORPHOLOGY:                  Calcific          Mixed  PLAQUE AMOUNT:                      Mild - moderate   Moderate - severe  PLAQUE LOCATION:                    Bifurcation, ICA  Bifurcation, ICA   IMPRESSION:  1. Right 40-59% ICA stenosis.  2. Left 60-79% ICA stenosis.  3. Bilateral ECA stenosis right > left, however, unable to replicate      velocities from previous studies.  4. Bilateral antegrade flow in vertebral arteries.  5. Bilateral ICAs categorically unchanged from study on 06/09/2007,      however, there was an increase in the left ICA PSV.   ___________________________________________  P. Liliane Bade, M.D.   PB/MEDQ  D:  01/12/2008  T:  01/12/2008  Job:  [id number]

## 2010-12-30 NOTE — Assessment & Plan Note (Signed)
OFFICE VISIT   KALIX, MEINECKE  DOB:  [birth year]                                       06/09/2007  ZOXWR#:[id number]   The patient is status post placement of an abdominal aortic stent graft  08/31/2006 at [facility].  This was complicated by the  development of a non-disabling left parietal lobe infarct.  He returns  today for routine scheduled followup.   Denies any neurologic symptoms.  No sensory, motor, or visual deficit.  No speech problems.  No gait abnormality.  Denies syncope or pre-  syncope.   He has been free of abdominal pain.  Denies back pain.   Carotid Doppler evaluation carried out today reveals a 60-79% left ICA  stenosis in the lower range.  Right ICA reveals a 40-59% stenosis.  This  is unchanged since January of this year.   He also underwent a CT scan prior to his visit today.  This reveals no  complicating factors associated with his AAA stent.  His aneurysm has  shrunk slightly in size from 5.3 to 5.2 cm.   On evaluation today, the patient appears well.  BP 198/88, pulse 71 per  minute and regular, respirations 18 per minute.  O2 saturation 98%.  He  has no carotid bruits.  Cranial nerves intact.  Strength equal  bilaterally.  His abdomen is soft and nontender.  No masses or  organomegaly.  No pulsatile masses.  Normal bowel sounds.  No bruits.  2+ femoral pulses bilaterally.   The patient has a well functioning AAA stent graft without apparent  complications and no evidence of endoleak.  His carotid artery disease  remains stable.  He has had no further  neurologic symptoms.  We will plan followup with him again in 6 months  with an ultrasound of the abdomen along with a carotid Doppler  evaluation.   Balinda Quails, M.D.  Electronically Signed   PGH/MEDQ  D:  06/09/2007  T:  06/11/2007  Job:  [id number]   cc:   Arta Silence, MD

## 2010-12-30 NOTE — Procedures (Signed)
ENDOVASCULAR STENT GRAFT EXAM   INDICATION:  Follow-up of AAA repair (Endograft) and right EIA  interposition Dacron graft.   HISTORY:  AAA.                           DUPLEX EVALUATION   AAA Sac Size:                 3.61 cm CM AP         4.17 CM TRV  Previous Sac Size:            4.49 CM AP            4.35 CM TRV  Evidence of an endoleak?      No                    No   Velocity Criteria:  Proximal Aorta                75 cm/sec  Proximal Stent Graft          78 cm/sec  Main Body Stent Graft-Mid     36 cm/sec  Right Limb-Proximal           69 cm/sec  Right Limb-Distal             96 cm/sec  Left Limb-Proximal            112 cm/sec  Left Limb-Distal              150 cm/sec  Patent Renal Arteries?        Yes                   Yes   IMPRESSION:  1. There is a decrease in AAA sac size from last study.  2. Patent Endograft with no evidence of focal stenosis.  3. Patent right EIA interposition graft with no evidence of focal      stenosis.  4. Normal bilateral lower extremity ABIs with biphasic Doppler      waveforms.   ___________________________________________  P. Liliane Bade, M.D.   AC/MEDQ  D:  01/03/2009  T:  01/03/2009  Job:  [id number]

## 2010-12-30 NOTE — Procedures (Signed)
CAROTID DUPLEX EXAM   INDICATION:  Follow-up evaluation of known carotid artery disease.   HISTORY:  Diabetes:  No.  Cardiac:  No.  Hypertension:  Yes.  Smoking:  Two packs per day.  Previous Surgery:  No.  CV History:  Patient reports no cerebrovascular symptoms at this time.  Amaurosis Fugax No, Paresthesias No, Hemiparesis No                                       RIGHT             LEFT  Brachial systolic pressure:         220               220  Brachial Doppler waveforms:         Triphasic         Triphasic  Vertebral direction of flow:        Antegrade         Antegrade  DUPLEX VELOCITIES (cm/sec)  CCA peak systolic                   46                56  ECA peak systolic                   255               175  ICA peak systolic                   124               204  ICA end diastolic                   25                58  PLAQUE MORPHOLOGY:                  Calcified         Mixed  PLAQUE AMOUNT:                      Mild-to-moderate  Moderate  PLAQUE LOCATION:                    Proximal ICA      Proximal ICA   IMPRESSION:  1. Bilateral external carotid artery stenosis, right greater than      left.  2. 40-59% right internal carotid artery stenosis.  3. 60-79% left internal carotid artery stenosis (low end of range).  4. No significant change since previous study performed on August 25, 2006.   ___________________________________________  P. Liliane Bade, M.D.   MC/MEDQ  D:  06/09/2007  T:  06/10/2007  Job:  [id number]

## 2010-12-30 NOTE — Procedures (Signed)
CAROTID DUPLEX EXAM   INDICATION:  Followup carotid artery disease.   HISTORY:  Diabetes:  No.  Cardiac:  No.  Hypertension:  Yes.  Smoking:  No.  Previous Surgery:  Endo stent repair of abdominal aortic aneurysm.  CV History:  Asymptomatic.  Amaurosis Fugax No, Paresthesias No, Hemiparesis No                                       RIGHT             LEFT  Brachial systolic pressure:         164               168  Brachial Doppler waveforms:         Normal            Normal  Vertebral direction of flow:        Antegrade         Antegrade  DUPLEX VELOCITIES (cm/sec)  CCA peak systolic                   73                68  ECA peak systolic                   256               186  ICA peak systolic                   122               250  ICA end diastolic                   38                56  PLAQUE MORPHOLOGY:                  Mixed             Mixed  PLAQUE AMOUNT:                      Mild              Moderate/severe  PLAQUE LOCATION:                    ICA/ECA           ICA/ECA   IMPRESSION:  1. 40-59% stenosis of the right internal carotid artery.  2. 60-79% stenosis of the left internal carotid artery.  3. No significant change noted when compared to the previous exam of      01/12/2008.   ___________________________________________  P. Liliane Bade, M.D.   CH/MEDQ  D:  07/05/2008  T:  07/05/2008  Job:  [id number]

## 2010-12-30 NOTE — Procedures (Signed)
ENDOVASCULAR STENT GRAFT EXAM   INDICATION:  Followup of AAA repair (Endograft) and right EIA  interposition Dacron graft.   HISTORY:  AAA.                           DUPLEX EVALUATION   AAA Sac Size:                 4.49 CM AP            4.35 CM TRV  Previous Sac Size:            5.5 CM AP             5.2 CM TRV  Evidence of an endoleak?      No                    No   Velocity Criteria:  Proximal Aorta                46 cm/sec  Proximal Stent Graft          53 cm/sec  Main Body Stent Graft-Mid     Mid 28 cm/sec  Right Limb-Proximal           77 cm/sec  Right Limb-Distal             83 cm/sec  Left Limb-Proximal            100 cm/sec  Left Limb-Distal              105 cm/sec  Patent Renal Arteries?        Yes                   Yes   IMPRESSION:  1. Decrease in AAA sac size with no evidence of endoleak.  2. Patent AAA stent (Endograft) with no evidence of stenosis.  3. Patent right EIA interposition graft without evidence of stenosis.   ___________________________________________  P. Liliane Bade, M.D.   PB/MEDQ  D:  01/12/2008  T:  01/12/2008  Job:  [id number]

## 2010-12-30 NOTE — Procedures (Signed)
CAROTID DUPLEX EXAM   INDICATION:  Followup of carotid disease.   HISTORY:  Diabetes:  No.  Cardiac:  No.  Hypertension:  Yes.  Smoking:  Current.  Previous Surgery:  Endograft and right EIA graft.  CV History:  Asymptomatic.  Amaurosis Fugax No, Paresthesias No, Hemiparesis No                                       RIGHT             LEFT  Brachial systolic pressure:         209               200  Brachial Doppler waveforms:         Triphasic         Triphasic  Vertebral direction of flow:        Antegrade         Antegrade  DUPLEX VELOCITIES (cm/sec)  CCA peak systolic                   68                63  ECA peak systolic                   204               162  ICA peak systolic                   168               380  ICA end diastolic                   50                79  PLAQUE MORPHOLOGY:                  Calcified         Calcified  PLAQUE AMOUNT:                      Moderate          Moderate to severe  PLAQUE LOCATION:                    Bifurcation and ICA                 CCA, ICA, ECA   IMPRESSION:  1. 40%-59% stenosis in the right internal carotid artery.  2. High-grade 60%-79% stenosis noted in the left internal carotid      artery.   ___________________________________________  Janetta Hora Fields, MD   CJ/MEDQ  D:  06/26/2009  T:  06/26/2009  Job:  [id number]

## 2010-12-30 NOTE — Procedures (Signed)
CAROTID DUPLEX EXAM   INDICATION:  Carotid disease.   HISTORY:  Diabetes:  No.  Cardiac:  No.  Hypertension:  Yes.  Smoking:  Yes.  Previous Surgery:  No carotid surgery.  CV History:  Asymptomatic.  Amaurosis Fugax No, Paresthesias No, Hemiparesis No.                                       RIGHT             LEFT  Brachial systolic pressure:         168               170  Brachial Doppler waveforms:         Normal            Normal  Vertebral direction of flow:        Antegrade         Antegrade  DUPLEX VELOCITIES (cm/sec)  CCA peak systolic                   60                69  ECA peak systolic                   206               219  ICA peak systolic                   120               242  ICA end diastolic                   33                65  PLAQUE MORPHOLOGY:                  Mixed             Mixed  PLAQUE AMOUNT:                      Moderate          Moderate/severe  PLAQUE LOCATION:                    ICA/ECA/CCA       ICA/ECA/CCA   IMPRESSION:  1. 40% to 59% stenosis of the right internal carotid artery.  2. 60% to 79% stenosis of the left internal carotid artery.  3. Bilateral external carotid artery stenoses noted.  4. No significant change noted when compared to the previous      examination on 06/26/2009.   ___________________________________________  Janetta Hora. Fields, MD   CH/MEDQ  D:  01/08/2010  T:  01/08/2010  Job:  [id number]

## 2010-12-30 NOTE — Procedures (Signed)
CAROTID DUPLEX EXAM   INDICATION:  Followup of known carotid artery disease.   HISTORY:  Diabetes:  No.  Cardiac:  No.  Hypertension:  Yes.  Smoking:  No.  Previous Surgery:  No.  CV History:  No.  Amaurosis Fugax No, Paresthesias No, Hemiparesis No                                       RIGHT             LEFT  Brachial systolic pressure:         212               211  Brachial Doppler waveforms:         WNL               WNL  Vertebral direction of flow:        Antegrade         Antegrade  DUPLEX VELOCITIES (cm/sec)  CCA peak systolic                   67                51  ECA peak systolic                   240               168  ICA peak systolic                   147               288  ICA end diastolic                   44                77  PLAQUE MORPHOLOGY:                  Calcified         Homogeneous soft  PLAQUE AMOUNT:                      Moderate          Moderate  PLAQUE LOCATION:                    Bif/ICA/ECA       CCA/ICA/ECA   IMPRESSION:  1. 40-59% right internal carotid artery stenosis.  2. 60-79% left internal carotid artery stenosis.       ___________________________________________  P. Liliane Bade, M.D.   AC/MEDQ  D:  01/03/2009  T:  01/03/2009  Job:  [id number]

## 2011-01-01 ENCOUNTER — Ambulatory Visit (INDEPENDENT_AMBULATORY_CARE_PROVIDER_SITE_OTHER): Payer: Medicare Other | Admitting: Vascular Surgery

## 2011-01-01 DIAGNOSIS — I739 Peripheral vascular disease, unspecified: Secondary | ICD-10-CM

## 2011-01-02 ENCOUNTER — Encounter (HOSPITAL_COMMUNITY)
Admission: RE | Admit: 2011-01-02 | Discharge: 2011-01-02 | Disposition: A | Discharge status: Home / Self Care | Payer: Medicare Other | Source: Ambulatory Visit | Attending: Vascular Surgery | Admitting: Vascular Surgery

## 2011-01-02 LAB — CBC
MCV: 93.1 fL (ref 78.0–100.0)
Platelets: 295 10*3/uL (ref 150–400)
RBC: 3.79 MIL/uL — ABNORMAL LOW (ref 4.22–5.81)
RDW: 12.7 % (ref 11.5–15.5)
WBC: 8.6 10*3/uL (ref 4.0–10.5)

## 2011-01-02 LAB — BASIC METABOLIC PANEL
BUN: 24 mg/dL — ABNORMAL HIGH (ref 6–23)
GFR calc Af Amer: 52 mL/min — ABNORMAL LOW (ref >60)
GFR calc non Af Amer: 43 mL/min — ABNORMAL LOW (ref >60)
Potassium: 4.7 mEq/L (ref 3.5–5.1)
Sodium: 136 mEq/L (ref 135–145)

## 2011-01-02 NOTE — Op Note (Signed)
NAME:  [name], [name]               ACCOUNT NO.:  [id number]   MEDICAL RECORD NO.:  [id number]          PATIENT TYPE:  AMB   LOCATION:  SDS                          FACILITY:  [facility]   PHYSICIAN:  Quita Skye. Hart Rochester, M.D.  DATE OF BIRTH:  Jan 10, 1933   DATE OF PROCEDURE:  07/29/2006  DATE OF DISCHARGE:                               OPERATIVE REPORT   PREOPERATIVE DIAGNOSIS:  Infrarenal abdominal aortic aneurysm.   PROCEDURE:  1. Biplane abdominal aortogram with bilateral iliac angiography via      right common femoral approach.   SURGEON:  Quita Skye. Hart Rochester, M.D.   ANESTHESIA:  Local Xylocaine.   CONTRAST:  85 mL.   COMPLICATIONS:  None.   DESCRIPTION OF PROCEDURE:  The patient taken to [facility] peripheral  endovascular lab, placed in supine position at which time both groins  were prepped with Betadine solution and draped in a routine sterile  manner.  After infiltration of 1% Xylocaine, right common femoral artery  was entered percutaneously.  Guidewire passed into the suprarenal aorta  under fluoroscopic guidance.  A 5-French sheath and dilator were passed  over the guide wire and dilator removed.  A standard pigtail catheter  (graduated) positioned in the suprarenal aorta.  A flush abdominal  aortogram was performed injecting 20 mL of contrast at 20 mL per second.  This revealed the aorta to be widely patent with single renal arteries  bilaterally.  There was an infrarenal aneurysm with a long neck between  the renal arteries and the beginning of the aneurysm.  The bifurcation  was unremarkable with bilateral common iliac arteries approximately 5-6  cm in length with no evidence of any significant stenosis.  Both  internal, external iliac arteries were widely patent.  The inferior  mesenteric artery was occluded.  A lateral aortogram was then performed  injecting 15 mL of contrast at 20 mL per second revealing very minimal  anterior angulation of the neck.  Following this the  catheter was  withdrawn into the terminal aorta and two iliac angiograms performed,  one in the AP projection, one in LAO projection revealing essentially  normal iliac anatomy with bilateral widely patent common iliac arteries  as well as internal and external iliac arteries.  Having tolerated  procedure well, the cath was removed over the guidewire, sheath removed,  common compression applied and no complications ensued.   FINDINGS:  1. Infrarenal abdominal aortic aneurysm with total occlusion of      inferior mesenteric artery and normal iliac anatomy.  2. Wide single widely patent renal arteries bilaterally.          ______________________________  Quita Skye Hart Rochester, M.D.    JDL/MEDQ  D:  07/29/2006  T:  07/29/2006  Job:  [id number]

## 2011-01-02 NOTE — Consult Note (Signed)
NAME:  [name], [name]               ACCOUNT NO.:  [id number]   MEDICAL RECORD NO.:  [id number]          PATIENT TYPE:  INP   LOCATION:  2034                         FACILITY:  [facility]   PHYSICIAN:  Pramod P. Pearlean Brownie, MD    DATE OF BIRTH:  1932-09-28   DATE OF CONSULTATION:  09/01/2006  DATE OF DISCHARGE:                                 CONSULTATION   REASON FOR REFERRAL:  Stroke.   HISTORY OF PRESENT ILLNESS:  [name] is a 77 year old Caucasian male  who had elective aortic stent for abdominal aortic aneurysm yesterday.  He was apparently normal today at lunchtime as per Dr. Hart Rochester as well as  ICU nurse's assessment at 2:00 p.m..  At 3:00 p.m., his daughter spoke  to him and noticed that he was easily confused, having trouble finding  words, and struggling to speak.  The patient was transferred to 2000 at  about 4:15 and, apparently at that time, the nurse assessed him and did  not notice anything wrong but, at 5 o'clock, did notice he was confused,  having trouble seeing as well as speaking.  The patient has not had any  headache, focal extremity weakness, numbness or dysarthria.  There is no  known prior history of stroke or TIA or significant neurological  problems.  The family says that he has known extracranial carotid  disease.   PAST MEDICAL HISTORY:  Significant for:  1. Hypertension.  2. Benign prostatic hypertrophy.  3. Kidney stones.  4. Gallstones.  5. Hypertriglyceridemia.  6. Impotence.  7. Abdominal aortic aneurysm.  8. Known 60-79% left ICA and 20-40% right ICA stenosis.   ALLERGIES TO MEDICATIONS:  SULFA.   PAST SURGICAL HISTORY:  Inguinal hernia repair.   HOME MEDICATIONS:  Captopril, Protonix, fenofibrate, metoprolol, aspirin  and Plavix started yesterday.   SOCIAL HISTORY:  The patient is married. He lives in [place]. Has  stepdaughter who lives in [place]. He used to smoke one pack per day  since 1960.  Has a shot of liquor every day.  Is  retired.   FAMILY HISTORY:  Is not significant for any neurological problems.   REVIEW OF SYSTEMS:  As documented in the admission H&P.   PHYSICAL EXAM:  Reveals an obese, middle-aged, Caucasian male who is not  in distress, afebrile, pulse rate is 77 per minute, regular, blood  pressure 168/76, sats 97% on 2 liters, respiratory rate 18 per minute,  distal pulses are well felt.  NEUROLOGICAL EXAM:  The patient is awake, alert.  He is oriented to  time, place and person.  He, however, has diminished attention.  His  calculation is intact.  He has decreased naming.  He could name only  five of eight objects correctly and had to struggle for some words.  He  has slight nonfluent speech but he has good understanding.  He can  repeat quite well.  He has poor recall, 0/3, as well as he is able to  name only five animals in 1 minute.  His eye movements are full range.  There is no nystagmus.  The patient has  diminished visual fields and  acuity bilaterally.  He has clearly what appears to be right homonymous  hemianopsia.  However, in the left eye also he makes mistakes with  finger counting, both in the nasal and temporal fields as well.  He does  best in the right nasal field.  Face is symmetric.  Palatal movements  normal.  Tongue is midline.  MOTOR SYSTEM EXAM:  Reveals no upper extremity drift, mild upper  extremity action tremor but no finger-to-nose ataxia.  Symmetric  strength in lower extremities with normal tone, reflexes, sensation,  coordination.  Plantars are both downgoing.  Gait was not tested.   DATA REVIEWED TODAY:  Noncontrast CAT scan of the head done today  reveals no acute abnormality.  There are scattered small vessel ischemic  changes bilaterally.  Carotid ultrasound done apparently preop in the  CVTS office shows moderate right ICA and mild left ICA stenosis.   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  The time of onset is unclear  but between probably 3-6 hours.  The patient does not qualify, hence,  for IV thrombolysis.  However, the patient may, perhaps, benefit by  being considered for participation in a stroke research trial. I have  discussed this with the patient and family members and they do not want  to participate in any trial.   PLAN:  Hence, we will treat the patient conservatively. Give him IV  fluids, test for swallowing, and start him back on his medications.  Continue aspirin and Plavix.  Check MRI scan of the brain with MRA of  the brain and neck, transcranial Doppler studies, 2-D echo, fasting  lipid profile, hemoglobin A1c, and homocysteine.  Physical/Occupational  therapy consults.  I had long discussion with the patient and family  members and answered questions.  To follow the patient __________ . I  also discussed the case with Dr. Liliane Bade.  Thank you for referral.           ______________________________  Sunny Schlein. Pearlean Brownie, MD     PPS/MEDQ  D:  09/01/2006  T:  09/02/2006  Job:  [id number]

## 2011-01-02 NOTE — H&P (Signed)
NAME:  [name], [name] NO.:  [id number]   MEDICAL RECORD NO.:  [id number]          PATIENT TYPE:  INP   LOCATION:  NA                           FACILITY:  [facility]   PHYSICIAN:  Balinda Quails, M.D.    DATE OF BIRTH:  1932/10/15   DATE OF ADMISSION:  08/31/2006  DATE OF DISCHARGE:                              HISTORY & PHYSICAL   PRIMARY PHYSICIAN:  Arta Silence, M.D.   UROLOGIST:  Lucrezia Starch. Earlene Plater, M.D.   CARDIOLOGISTS:  [facility].   CHIEF COMPLAINT:  Abdominal aortic aneurysm.   HISTORY OF PRESENT ILLNESS:  [name] is a 77 year old Caucasian male  who has been followed by Dr. Madilyn Fireman since March 2006, for an abdominal  aortic aneurysm found incidentally during a workup for gross hematuria.  At that time, the aneurysm measured 5.1-cm by CT scan.  He was  asymptomatic.  He has been followed every 6 months since.  In November  2007, the abdominal ultrasound showed an increase in aneurysm size now  to 5.5-cm.  CT angiogram was ordered and was performed by Dr. Hart Rochester on  July 29, 2006, showing infrarenal abdominal aortic aneurysm with  total occlusion of the inferior mesenteric artery and normal iliac  anatomy.  There is a single widely patent renal arteries bilaterally.  The aortic neck measured 3-cm in diameter with a length of 3.6-cm.  These findings were most consistent with an aneurysm that would be  amendable to stent graft placement.  The patient was in agreement for  stent graft repair of his aneurysm.  Prior to scheduling surgery, he had  a Cardiolite done at [facility] which showed probable prior  inferior infarct with very mild peri-infarct ischemia but overall was  felt to represent a low-risk study.  Today, he  also underwent pre-  operative Doppler studies showing moderate carotid disease on the left  at 60-79% and mild disease on the right at 20-39% with bilateral  vertebral flow antegrade.  His ankle-brachial indices were  0.99 on the  right, and 0.105 on the left.  He continues to be asymptomatic, denying  abdominal pain, back pain, hematochezia.  His hematuria has since  resolved and it was felt due to prostatitis.  He denies chest pain,  shortness of breath, or history of stroke.   PAST MEDICAL HISTORY:  1. Abdominal aortic aneurysm.  2. Moderate left internal carotid artery stenosis as described above.  3. Hypertension.  4. BPH/prostatitis with history of hematuria.  5. Nephrolithiasis.  6. Asymptomatic gallstones.  7. Hypertriglyceridemia.  8. Impotence.   PAST SURGICAL HISTORY:  Bilateral inguinal hernia repair tooth around  2001.   ALLERGIES:  SULFA WHICH CAUSES MENTAL STATUS CHANGES.   MEDICATIONS:  1. Captopril 50 mg p.o. b.i.d.  2. Protonix 40 mg p.o. daily.  3. Fenofibrate 160 mg p.o. daily.  4. Metoprolol 100 mg p.o. daily   REVIEW OF SYSTEMS:  See HPI for pertinent positives and negatives.  In  addition, he wears glasses, has intermittent wheezing, nocturia, and  crampy in his calves with prolonged walking.   SOCIAL  HISTORY:  He is married since February.  He has a daughter who  lives in [place] and a stepdaughter who lives here in [place].  He  has smoked one pack of cigarettes a day since 1960.  He drinks a shot of  liquor per day.  He is retired and lives in a __________  .   FAMILY HISTORY:  Mother died of coronary artery disease  in 40.  Father in 1972 from abdominal aortic aneurysm.  He has three brothers  with history of myocardial infarction.   PHYSICAL EXAMINATION:  VITAL SIGNS:  Blood pressure 160/80, heart rate  60, respirations 20.  GENERAL APPEARANCE:  This is a 77 year old Caucasian male who is alert,  cooperative in no acute distress.  HEENT:  Head is normocephalic, atraumatic.  Pupils are equal, round,  react to light accommodation.  Sclera are nonicteric.  Oral mucosa is  pink and moist.  He has a lower permanent bridge and complete upper  denture.   NECK:  Neck is supple.  He has palpable carotid pulses with soft bruits  bilaterally, left greater than the right.  He has no lymphadenopathy  noted.  RESPIRATORY:  There were few faint wheezes posteriorly, right greater  than the left but otherwise clear.  CARDIAC:  His heart has a regular rate and rhythm.  No murmur, rub, or  gallop was noted.  ABDOMEN:  His abdomen is soft, nontender, nondistended with normoactive  bowel sounds.  No hepatomegaly was noted.  He does have a prominent  abdominal pulsation.  GU/RECTAL:  These exams were deferred.  EXTREMITIES:  Lower extremities show no edema.  They are warm and dry.  He has 2+ radial pulses, 1+ posterior tibial and dorsalis pedis pulses,  and 1+ femoral pulse on the right and 2+ on the left.  NEUROLOGIC:  Grossly intact.  He is alert and oriented x4.  Speech is  clear.  Gait is steady.  Muscle strength is 5/5 in his upper and lower  extremities, grossly symmetrical.   ASSESSMENT:  1. A 5.3-cm infrarenal abdominal aortic aneurysm.  2. Moderate left internal carotid artery stenosis.  3. Ongoing tobacco abuse.   PLAN:  [name] will be electively admitted to [facility] on  August 31, 2006, to undergo endovascular stent graft repair of his  abdominal aortic aneurysm.  In regards to his moderate left internal  carotid artery stenosis, Dr. Madilyn Fireman will officially review his vascular  lab study and will most likely need 6 to 57-month followup with a  carotid duplex at our office.  We also discussed smoking cessation in  regards to his tobacco abuse.      Jerold Coombe, P.A.      Balinda Quails, M.D.  Electronically Signed    AWZ/MEDQ  D:  08/25/2006  T:  08/25/2006  Job:  [id number]   cc:   Balinda Quails, M.D.

## 2011-01-02 NOTE — Assessment & Plan Note (Signed)
OFFICE VISIT  [name], [name] DOB:  [birth year]                                       01/01/2011 ZOXWR#:[id number]  The patient returns for follow-up today.  He recently went left femoral to above-knee popliteal bypass using vein on Dec 22, 2010.  His bypass had single-vessel runoff via the peroneal artery.  This was done for a left fourth toe ulceration with early gangrene.  He states today that he still is having significant pain in the left fourth toe.  PHYSICAL EXAMINATION:  Blood pressure is 174/71 in the left arm, heart rate 63 and regular, respirations 24.  Left leg incisions are all healing well except for a small amount of serous drainage from the left groin incision.  The left fourth toe is gangrenous at the tip.  There is erythema and grayish discoloration of the skin up into the metatarsal area.  The patient's ABIs have increased from 0.49 to 0.74 postoperatively.  The patient has a brisk peroneal Doppler signal.  At this point I believe the best option for pain control long-term is going to be amputation of the patient's left fourth toe.  This was discussed with the patient today as well as risks, benefits, possible complications of the procedure.  He understands and agrees to proceed. We will try to do this with a regional ankle block, and this is scheduled for Monday May, 17, 2012.    Janetta Hora. Fields, MD Electronically Signed  CEF/MEDQ  D:  01/01/2011  T:  01/02/2011  Job:  [id number]

## 2011-01-02 NOTE — Op Note (Signed)
NAME:  [name], [name]               ACCOUNT NO.:  [id number]   MEDICAL RECORD NO.:  [id number]          PATIENT TYPE:  AMB   LOCATION:  DAY                          [facility]ACILITY:  [facility]   PHYSICIAN:  Ronald L. Earlene Plater, M.D.  DATE OF BIRTH:  07-31-1933   DATE OF PROCEDURE:  10/13/2006  DATE OF DISCHARGE:                               OPERATIVE REPORT   PREOPERATIVE DIAGNOSES:  Right ureterolithiasis with mild hydronephrosis  and renal insufficiency.   POSTOPERATIVE DIAGNOSES:  Right ureterolithiasis with mild  hydronephrosis and renal insufficiency.   PROCEDURE:  Cystourethroscopy, right retrograde ureteropyelogram and  placement of right double-J stent.   SURGEON:  Dr. Gaynelle Arabian.   ANESTHESIA:  LMA.   ESTIMATED BLOOD LOSS:  Negligible.   TUBES:  26-cm 6-French contoured double pigtail stent.   COMPLICATIONS:  None.   INDICATIONS FOR PROCEDURE:  Mr. Wieczorek is a very nice 77 year old white  male who presented with gross hematuria, recent right flank pain, recent  abdominal aortic aneurysm repair with endovascular surgery, possible  UTI. He subsequently underwent workup which revealed on CT scan a mild  right hydronephrosis secondary to 8.5 mm stone in the ureter at the  level of L5. The creatinine prior to this scan was 2.9. We discussed the  risks, benefits, and alternatives. He has elected to proceed with cysto,  retrograde ureteropyelogram and placement of a right stent. He is  currently on Plavix and aspirin and when the Plavix can be stopped, we  will electively plan on ureteroscope extraction of stone. The stone is  very difficult to see on pain x-ray.   DESCRIPTION OF PROCEDURE:  The patient was placed in supine position and  after proper LMA anesthesia was placed in the dorsal lithotomy position,  prepped and draped with Betadine in a sterile fashion. Cystourethroscopy  was performed with a 22.5 Wolf panendoscope. The prostate was noted to  be moderate trilobar  hypertrophy, grade 1 trabeculation was noted at the  bladder, efflux of clear urine was noted from the left ureteral orifice  and the right ureteral orifice is in normal location. Utilizing a 6-  [place] open ended catheter, a right retrograde ureteropyelogram was  performed. The ureter was somewhat tortuous and at the L5 level there  was a filling defect which appeared to be smooth. It was approximately 8  mm in length and 5 mm in width and really could not be seen well on  plain x-ray. There was a mild hydroureteronephrosis above it. Under  fluoroscopic guidance, a 26-cm 6-French contour double pigtail stent was  placed and noted to be in good position within the right renal pelvis  within the bladder. No string was attached. The bladder was drained, the  panendoscope was removed and the patient was taken to the recovery room  stable.   RIGHT RETROGRADE URETEROPYELOGRAM:  I utilized a 6-French open-ended  catheter and right retrograde ureteropyelogram was performed. He was  noted to have an 8 x 5 mm ureteral calculus at the L5 level. It was very  faintly calcified and very difficult to see but the filling defect  was  readily apparent and it was smooth walled and corresponded to the stone  seen on CT scan. There was a mild hydroureteronephrosis above it.      Ronald L. Earlene Plater, M.D.  Electronically Signed     RLD/MEDQ  D:  10/13/2006  T:  10/13/2006  Job:  [id number]

## 2011-01-02 NOTE — Discharge Summary (Signed)
NAME:  [name], [name]               ACCOUNT NO.:  [id number]   MEDICAL RECORD NO.:  [id number]          PATIENT TYPE:  INP   LOCATION:  2034                         FACILITY:  [facility]   PHYSICIAN:  Balinda Quails, M.D.    DATE OF BIRTH:  1932-09-27   DATE OF ADMISSION:  08/31/2006  DATE OF DISCHARGE:  09/03/2006                               DISCHARGE SUMMARY   PRIMARY DIAGNOSIS:  1. 5.5 cm abdominal aortic aneurysm.   IN HOSPITAL DIAGNOSES:  1. Acute infarct in left parietal lobe.   SECONDARY DIAGNOSES:  1. Moderate left internal carotid artery stenosis.  2. Hypertension.  3. Benign prostatic hypertrophy/prostatitis with history of hematuria.  4. Nephrolithiasis.  5. Asymptomatic gallstones.  6. Hypertriglyceridemia.  7. Impotence.  8. Status post bilateral inguinal hernia repair around 2000.  9. Urinary tract infection.   IN HOSPITAL OPERATIONS AND PROCEDURES:  1. Endologics power link AAA stent graft to 28 x 16 x 140 and 2675      proximal cuff.  2. Interposition repair right external iliac artery with 7 mm Dacron      graft.   HISTORY AND PHYSICAL AND HOSPITAL COURSE:  [name] is 77 year old  Caucasian male who is followed by Dr. Madilyn Fireman since March 2006 for  abdominal aortic aneurysm found incidentally during a workup for gross  hematuria.  At that time aneurysm measured 5.1 cm by CT scan.  The  patient was asymptomatic.  He had been followed every 6 months since.  November 2007 abdominal ultrasound showed increase in the aneurysm size,  now 5.5 cm.  CT angiogram was ordered and performed by Dr. Hart Rochester  July 29, 2006 showing infrarenal abdominal aortic aneurysm with  total occlusion of the inferior mesenteric artery and normal anatomy.  These findings were more consistent with an aneurysm that would be  amenable to stent graft placement.  The patient was seen and evaluated  by Dr. Madilyn Fireman.  Dr. Madilyn Fireman discussed with the patient undergoing stent  graft placement.  The  patient acknowledged understanding and agreed to  proceed.  Prior to surgery the patient was seen at [facility]  where he underwent Cardiolite stress test which showed probable prior  inferior infarct with very mild peri-infarct ischemia, overall felt to  represent a low risk study.  The patient's surgery was scheduled for  August 31, 2006.  For details of the patient's past medical history and  physical exam please see dictated H&P.   The patient was taken to the operating room August 31, 2006 where he  underwent Endologics power link AAA stent graft with interposition  repair of right external iliac artery using a 7 mm Dacron graft.  The  patient tolerated this procedure and was transferred to the intensive  care unit in stable condition.  Postop day #1 the patient was noted to  be stable.  He is known to be hemodynamically stable with H&H measuring  9.3 and 27.7%.  Vital signs were stable.  Sat's were greater than 90% on  room air.  The patient did undergo ABIs postop day #1 showing on the  right to be 0.95 and on the left to be 0.88.  The patient was  transferred down to 2000 postop day #1.  Postop day #1 the patient's  speech was noted to be slurred.  After assessment by nurse the patient  was noted to have right-sided facial drooping.  At the response call as  well a neuro and MRI ordered.  MRI was done the evening of postop day #1  and showed an acute infarct in left parietal lobe.  Neurology was  notified.  The following day the patient's speech noted to have improved  slightly.  Neuro continued to see the patient postoperatively.  Recommended continuing Plavix and aspirin.  Physical therapy and  occupational therapy were consulted.  Physical therapy felt the  patient's mobility was stable and services were not required.  By postop  day #3 neuro had cleared the patient for discharge home.  The patient's  speech had continued to improve.  He was out of bed ambulating  well.  The patient was tolerating diet well and no dysphagia.  Vital signs  remained stable.  Prior to discharge home the patient was noted to have  developed a UTI.  He did have a low grade temperature and was started on  Ciprofloxacin at that time.  Prior to discharge the patient and  bilateral DP, PT pulses noted.  Incisions clean, dry and intact and  healing well.  Pulmonary status was stable.  The patient was in normal  sinus rhythm.   The patient was discharged home postop day #3, September 03, 2006 in  stable condition.   FOLLOW-UP APPOINTMENTS:  A follow-up appointment will be arranged with  Dr. Madilyn Fireman for one month with CT scan.  Our office will contact the  patient with this information.   ACTIVITY:  Patient instructed on driving until released to do so, no  lifting over 10 pounds.  The patient is told to ambulate 3 to 4 times  per day, progress as tolerated and to continue breathing exercises.   Incisional care.  The patient is told to shower, washing his incisions  using soap and water.  He is to contact the office if he develops any  drainage or opening from any of his incision sites.  The appointment has  been arranged with the nurse for suture removal for September 06, 2006 at  10:00 a.m..   DIET:  The patient to be on low-fat, low-salt.   DISCHARGE MEDICATIONS:  1. Plavix 75 mg daily.  2. Aspirin 81 mg daily.  3. Ciprofloxacin 500 mg b.i.d. times 5 days.  4. Tylenol use as needed for pain.      Stephanie Acre Dominick, PA      P. Liliane Bade, M.D.  Electronically Signed    KMD/MEDQ  D:  11/30/2006  T:  11/30/2006  Job:  [id number]   cc:   Balinda Quails, M.D.

## 2011-01-02 NOTE — Op Note (Signed)
[place]  Patient:    [name], [name] Visit Number: [id number] MRN: [id number]          Service Type: DSU Location: [facility] Attending Physician:  Chapman Moss Dictated by:   Teena Irani. Odis Luster, M.D. Proc. 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  POSTOPERATIVE DIAGNOSES: 1. 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 2. Complicated open wound of the right postauricular secondary to a carcinoma    excision, 2.0 cm.  PROCEDURES: 1. Excision of carcinoma, squamous cell, right postauricular, 0.5 cm. 2. Complex wound repair, right postauricular posterior aspect of the ear,    2.0 cm.  SURGEON:  Teena Irani. Odis Luster, M.D.  ANESTHESIA:  1% lidocaine with epinephrine plus bicarbonate.  CLINICAL NOTE:  The patient is a 77 year old man referred by a dermatologist for an excision of a squamous cell carcinoma, right posterior ear.  This is off of the helical rim.  The nature of the procedure and the risks were discussed with him, including the possibility of further surgery depending on the final pathology report.  He understood all this and wished to proceed.  DESCRIPTION OF PROCEDURE:  The ellipse of the incision was marked.  He was prepped with Betadine and draped with sterile drapes.  A satisfactory local anesthesia was achieved.  The elliptical excision was performed.  Excellent hemostasis was noted.  The wound was irrigated thoroughly.  This was a complex wound resulting from this cancer excision with somewhat of an irregular orientation, and it was closed using 5-0 Prolene simple interrupted sutures and 5-0 Prolene horizontal mattress suture as indicated.  Antibiotic ointment and dry sterile dressing were applied, and he tolerated the procedure well. There was not any distortion of the helical rim at the  conclusion.  DISPOSITION:  No lifting, no exercising for two days.  Keep Polysporin antibiotic ointment on the wound three times a day for the next three days. We will see him back in two weeks for recheck and suture removal.  He can shower tomorrow and pack the area dry. Dictated by:   Teena Irani. Odis Luster, M.D. Attending Physician:  Chapman Moss DD:  07/04/01 TD:  07/04/01 Job: [id number]

## 2011-01-02 NOTE — Op Note (Signed)
[facility]  Patient:    [name], [name] Visit Number: [id number] MRN: [id number]          Service Type: DSU Location: DAY Attending Physician:  Katha Cabal Dictated by:   Thornton Park Daphine Deutscher, M.D. Proc. Date: 10/19/01 Admit Date:  10/19/2001   CC:         Laurita Quint, M.D. [facility]   Operative Report  CCS#:  [id number]  PREOPERATIVE DIAGNOSIS:  Bilateral inguinal hernias.  POSTOPERATIVE DIAGNOSIS:  Bilateral direct inguinal hernias.  PROCEDURE:  Laparoscopic repair of bilateral direct inguinal hernias with Davol mesh.  SURGEON:  Thornton Park. Daphine Deutscher, M.D.  ANESTHESIA:  General endotracheal.  DESCRIPTION OF PROCEDURE:  Mr.  Kaydn Kumpf is a 77 year old gentleman who was brought without [facility] at approximately 4:30 p.m. on October 19, 2001. He was given general anesthesia. The abdomen was prepped with Betadine, draped sterilely. A longitudinal incision was made below his umbilicus and I slid off to the right side and did an anterior fascial incision. I dissected the muscle laterally and bluntly and created a space for the balloon dissector. This went down to the pubis. The balloon dissector was inflated under laparoscopic vision and it deployed nicely. It created a good space both laterally and superiorly. Next, two 5 mm trocars were placed obliquely through rectus muscle avoiding the major vessels of the anterior abdominal wall.  Dissection was then begun on the right side where I found a very large direct inguinal hernia. I went ahead and mobilized the tissue laterally and got a good lateral clearance and then dissected free the cord structures. I found the vas and the spermatic vessels which I preserved and found the peritoneal portion of the abdominal cavity and teased it well away from the cord structures. I did the same on the left side and found in addition to a smaller direct inguinal hernia, a small femoral hernia again  which was removed with any fatty material. Cord structures again I got around and freed them. I used a piece of medium size bard precut curved mesh with the right medium for the r side. This went in and deployed nicely and was anchored medially and covered the defect very well and laid in very nicely. This was secured with a tacker. On the left side, again I used the left precut curved piece of mesh and secured it too with the tacker. I did not use any tacks below anywhere I could not feel a tack being placed and I did not really put any tacks laterally on either side. The mesh lay very nicely over the defects. The area was deflated. No bleeding was seen. The ports were removed as was the insufflation port which was repaired with a pursestring suture of #0 Vicryl. The skin was closed with 4-0 Vicryl with Benzoin and Steri-Strips. The patient tolerated the procedure well and was taken to the recovery room in satisfactory condition. Because of the hour and the patients age, he will be kept overnight for observation. Dictated by:   Thornton Park Daphine Deutscher, M.D. Attending Physician:  Katha Cabal DD:  10/19/01 TD:  10/20/01 Job: [id number]

## 2011-01-02 NOTE — Op Note (Signed)
NAME:  [name], [name]               ACCOUNT NO.:  [id number]   MEDICAL RECORD NO.:  [id number]          PATIENT TYPE:  AMB   LOCATION:  [facility]                         FACILITY:  [facility]   PHYSICIAN:  Ronald L. Earlene Plater, M.D.  DATE OF BIRTH:  07-05-1933   DATE OF PROCEDURE:  10/26/2006  DATE OF DISCHARGE:  10/26/2006                               OPERATIVE REPORT   PREOPERATIVE DIAGNOSIS:  Right ureterolithiasis with the high grade  obstruction from impacted stone.   OPERATIVE PROCEDURE:  Cystourethroscopy, right retrograde pyelogram,  right ureteroscopy with holmium laser lithotripsy, basket stone  extraction, and placement of right double-J stent.   SURGEON:  Dr. Gaynelle Arabian.   ANESTHESIA:  LMA.   BLOOD LOSS:  Negligible.   TUBES:  26 cm 7-French contoured double pigtail stent.   COMPLICATIONS:  None.   PROCEDURE:  [name] is a very nice 77 year old white male who  presented with borderline renal insufficiency, had recent endovascular  surgery and had obstructed right mid ureteral stone which was quite  large. He underwent cysto placement of double-J stent.  He is now coming  back for ureteroscopy and holmium laser lithotripsy.  He has stopped his  Plavix but is maintained on aspirin. He has been cleared for surgery.   DESCRIPTION OF PROCEDURE:  The patient was placed in supine position  after proper LMA anesthesia was placed in the dorsal lithotomy position  and prepped and draped with Betadine in sterile fashion.  Cystourethroscopy was performed with 22.5 [place] Olympus panendoscope.  He had moderate trilobar hypertrophy, grade 2 trabeculation with several  diverticula. The stent was grasped, pulled to the meatus and a 0.038  French sensor wire was placed into the right renal pelvis.  Ureteroscopy  was then performed and was noted to be highly angulated but stone was  finally visualized with a short thin ureteroscope.  It was noted to be  highly impacted and really could not  be reached with the laser.  Therefore was grabbed with a nitinol basket and pulled just distally,  although the nitinol basket then hung in place.  The nitinol basket was  cut and removed and left in position and ureteroscope was revisualized  over to the stone. Utilizing a 365 micron laser fiber on setting of 0.5  and repetition rate of 5, the stone was quite large, quite dense, but  was fragmented into multiple small fragments. The old basket was removed  and stone fragments were extracted serially with nitinol basket and will  be submitted for stone analysis.  Reinspection of the lower two-third  ureter revealed there were no significant fragments remaining.  There  were no perforations. Dye was then injected through the ureteroscope and  although there was some hydronephrosis, there was no extravasation  or filling defects noted.  Under fluoroscopic guidance a 26-cm 7-French  contoured double pigtail stent was placed without a string and noted to  be in good position within the right renal pelvis and within the  bladder.  The bladder was drained. Stent location was confirmed  fluoroscopically and the patient was taken to the  recovery room stable.      Ronald L. Earlene Plater, M.D.  Electronically Signed     RLD/MEDQ  D:  10/26/2006  T:  10/28/2006  Job:  [id number]

## 2011-01-02 NOTE — Assessment & Plan Note (Signed)
[facility]                                 ON-CALL NOTE   EXZAVIER, RUDERMAN                      MRN:          [id number]  DATE:08/14/2006                            DOB:          Nov 10, 1932    Phone number is [phone].  The patient has had a problem with a stomach  virus, nausea and vomiting at the beginning but has subsided, although  some nausea persists, and now has persistent diarrhea.  This whole  illness began about 72 hours ago or a little bit more, and has no  associated significant fever or significant abdominal pain.  The  diarrhea the last 24 hours was about 4 to 5 times, although he was up  most of the night last night.  There is no blood, it is just watery, and  when he eats something it goes right through him.  He is still voiding  at present.  No one else that he knows of is sick.  He has tried Imodium  yesterday one time with no significant help.  I have told him to re-try  the Imodium but take with the loading dose and then 1 after each  unformed stool, but he still needs to take p.o. fluids, small amounts,  and if he starts having signs of dehydration or significant abdominal  pain or blood he should call back or be evaluated in the emergency room,  otherwise be seen on Monday or when the office opens.  He can call back  at any time.     Neta Mends. Panosh, MD  Electronically Signed    WKP/MedQ  DD: 08/15/2006  DT: 08/15/2006  Job #: [id number]

## 2011-01-02 NOTE — Op Note (Signed)
NAME:  [name] NO.:  [id number]   MEDICAL RECORD NO.:  [id number]          PATIENT TYPE:  INP   LOCATION:  2550                         FACILITY:  [facility]   PHYSICIAN:  Balinda Quails, M.D.    DATE OF BIRTH:  11-Mar-1933   DATE OF PROCEDURE:  08/31/2006  DATE OF DISCHARGE:                               OPERATIVE REPORT   SURGEON:  Denman George, M.D.   ASSISTANT:  Waverly Ferrari, M.D.   ANESTHETIC:  General endotracheal. Anesthesiologist is Dr. Gypsy Balsam.   PREOPERATIVE DIAGNOSIS:  Is a 5.5-cm abdominal aortic aneurysm.   POSTOPERATIVE DIAGNOSIS:  Is a 5.5-cm abdominal aortic aneurysm.   PROCEDURE.:  1. Endologix power link AAA stent graft (28 x 16 x 140) (28 x 75      proximal cuff).  2. Interposition repair right external iliac artery with 7-mm Dacron      graft.   CONTRAST:  50 mL Visipaque.   FLUOROSCOPY:  5 minutes.   CLINICAL NOTE:  [name] is a 77 year old male with known  abdominal aortic aneurysm.  Following in the office on a regular basis,  recent visit revealed enlargement of his aneurysm to 5.5 cm.  He  underwent CT scan and arteriography.  He was felt to be candidate for  placement aortic stent graft.  Brought to the operating room at this  time for planned procedure.  The risks for procedure including major  morbidity mortality 1-2%.   OPERATIVE NOTE:  The patient brought to the operating room stable  condition.  Placed in supine position.  The abdomen and both legs  prepped and draped in sterile fashion.   Oblique skin incision made in the right groin.  Dissection carried  through subcutaneous tissue electrocautery.  The right common femoral  artery exposed.  This mobilized up the inguinal ligament encircled with  vessel loop.  The profundus femoris and superficial femoral artery  origins were freed and encircled vessel loops.  The right common femoral  artery revealed excellent pulse.   The left common femoral  artery was accessed percutaneously.  A small  incision made in the skin and an 18 gauge needle was introduced left  common femoral artery.  0.035 Rosen guidewire advanced through the  needle into the mid abdominal aorta under fluoroscopy.  A 9-French  sheath then advanced over guidewire and into left common femoral artery.  The dilator removed sheath flushed with heparin saline solution.   The right common femoral artery was then accessed also with the 18 gauge  needle.  0.035 J-wire passed through the needle into the mid abdominal  aorta under fluoroscopy.  A 12-French tearaway sheath was then advanced  over guidewire.  The right common femoral artery and dilator removed.  The patient administered 7000 units heparin intravenously.   A snare sheath was then advanced over the right femoral guidewire.  The  guidewire removed.  The snare inserted.  The snare then used to grasp  the left femoral guidewire and brought over across the iliac into the  left common femoral and across the iliac into  the right common femoral  and right femoral sheath.  A dual lumen catheter was then advanced over  the left femoral guidewire from left to right.  The second lumen marker  was placed at 11 o'clock. An Amplatz super stiff guidewire advanced  through the second lumen up into the aorta.  The tearaway sheath was  then removed from the right common femoral artery.  The Endologix power  link bifurcated graft was then advanced over the super stiff guidewire  with the contralateral limb advanced through the dual lumen catheter  into the right common femoral sheath.  The bifurcated stent graft was  then advanced up into the aorta under fluoroscopy.  The sheath withdrawn  and the contralateral limb was freed.  The bifurcated device then  brought down to the aortic bifurcation and the main body of the graft  was then deployed in the aorta.  The contralateral limb was released and  a 0.014 guidewire was advanced  through the hollow-tip contralateral limb  up into the bifurcation device.  The contralateral guidewire was then  completely removed.  A pigtail catheter advanced over the 0.014  guidewire up into the abdominal aorta.   An abdominal aortogram was obtained.  This verified position of the  renal arteries.  The right limb of the bifurcated device was then  completely deployed and the sheath removed.  A proximal extension stent  of 28 x 75 was then advanced over the right femoral guidewire and up  into the abdominal aorta.  This was positioned at the infrarenal  position and deployed.  The sheath removed.  The pigtail catheter was  then drawn back down into the aortic graft and advanced up back into the  abdominal aorta.  A predilatation arteriogram was obtained.  This  verified good position of this proximal stent at the infrarenal  position.   A 26 coated balloon was then advanced over guidewire up into the body of  the aortic graft, deployed and was inflated in the proximal cuff at the  level the renal arteries and also at the junction.  This was then  brought down to the right iliac limb, through the left femoral sheath  was passed a 12 x 4 Powerflex balloon and using a kissing balloon  technique the coated balloon and 12 x 4 Powerflex were inflated up to 6  atmospheres.  The balloons then removed.  The pigtail catheter  reinserted. Completion arteriogram obtained.  This revealed excellent  result without evidence of any significant Endo leak.  Specifically no  type 1 or type 3 leak were noted.   The guidewires and catheters were then removed from the left groin.  A  guidewire reinserted and the left femoral sheath removed.  A star close  sheath was advanced over guidewire.  The sheath, however did not advance  well and easily and therefore this star close was not used.  The sheath  was removed and pressure was placed on the left groin.  The patient administered 50 mg protamine  intravenously to aid with hemostasis.   The right groin there was a tear of the common femoral artery and also  there appeared to be some dissection in the distal right external iliac  artery.  The right external iliac artery was mobilized up under the  inguinal ligament, controlled with a Henley clamp and all the sheaths,  catheters, and guidewires removed from the right groin.  A 4 Fogarty  catheter was passed down the right  superficial femoral and profunda  femoris arteries without return of clot.  The common femoral artery  distally was controlled with a DeBakey clamp.  The common femoral artery  divided transversely just beyond its spiral tear.  A spiral tear was  then mobilized up into the inguinal ligament.  Tributaries ligated with  3-0 silk and divided.  The external iliac artery divided transversely.  An interposition graft, a 6-mm Dacron was then placed to repair the  external iliac artery.  This was anastomosed end-to-end to the proximal  external iliac artery using running 5-0 Prolene suture.  Brought down to  the right common femoral artery and anastomosed end-to-end using running  6-0 Prolene suture.  Clamps were removed.  Excellent flow present.  Adequate hemostasis obtained.  Sponge and instrument counts correct.   Right groin was then closed running 2-0 Vicryl suture in two  subcutaneous layers, 4-0 Monocryl in the skin.  Steri-Strips applied.  Sterile dressing applied.   The patient tolerated procedure well.  The only significant complication  was the short area of dissection of the right external iliac artery  which was repaired with interposition graft.  The patient transferred to  recovery room in stable condition.      Balinda Quails, M.D.  Electronically Signed     PGH/MEDQ  D:  08/31/2006  T:  08/31/2006  Job:  [id number]   cc:   Arta Silence, MD

## 2011-01-05 ENCOUNTER — Ambulatory Visit (HOSPITAL_COMMUNITY)
Admission: RE | Admit: 2011-01-05 | Discharge: 2011-01-05 | Disposition: A | Discharge status: Home / Self Care | Payer: Medicare Other | Source: Ambulatory Visit | Attending: Vascular Surgery | Admitting: Vascular Surgery

## 2011-01-05 ENCOUNTER — Other Ambulatory Visit: Payer: Self-pay | Admitting: Vascular Surgery

## 2011-01-05 DIAGNOSIS — I96 Gangrene, not elsewhere classified: Secondary | ICD-10-CM | POA: Insufficient documentation

## 2011-01-05 DIAGNOSIS — I739 Peripheral vascular disease, unspecified: Secondary | ICD-10-CM | POA: Insufficient documentation

## 2011-01-05 DIAGNOSIS — M869 Osteomyelitis, unspecified: Secondary | ICD-10-CM | POA: Insufficient documentation

## 2011-01-05 DIAGNOSIS — I70269 Atherosclerosis of native arteries of extremities with gangrene, unspecified extremity: Secondary | ICD-10-CM

## 2011-01-05 DIAGNOSIS — I10 Essential (primary) hypertension: Secondary | ICD-10-CM | POA: Insufficient documentation

## 2011-01-06 NOTE — Op Note (Signed)
NAME:  [name], [name] NO.:  [id number]  MEDICAL RECORD NO.:  [id number]           PATIENT TYPE:  I  LOCATION:  3305                         FACILITY:  [facility]  PHYSICIAN:  Janetta Hora. Lorne Winkels, MD  DATE OF BIRTH:  1933-03-28  DATE OF PROCEDURE:  12/22/2010 DATE OF DISCHARGE:                              OPERATIVE REPORT   PROCEDURES: 1. Left femoral to above-knee popliteal bypass using nonreversed left     greater saphenous vein. 2. Intraoperative arteriogram x1.  PREOPERATIVE DIAGNOSIS:  Nonhealing wound, left foot.  POSTOPERATIVE DIAGNOSIS:  Nonhealing wound, left foot.  ANESTHESIA:  General.  ASSISTANT:  Della Goo, PA-C  OPERATIVE FINDINGS: 1. A 3-4 mm left greater saphenous vein with some varicosities 2. One-vessel runoff via the peroneal artery.  OPERATIVE DETAILS:  After obtaining informed consent, the patient was taken to the operating room.  The patient was placed in supine position on the operating room table.  After induction of general anesthesia and endotracheal intubation, the patient had a Foley catheter plated.  Next, the patient's entire left lower extremity was prepped and draped in usual sterile fashion.  A longitudinal incision was made in left groin, carried down through the subcutaneous tissues down to the level of left common femoral artery.  This had some posterior plaque and some circumferential calcification right at the level of femoral bifurcation. The artery was dissected free circumferentially up underneath the inguinal ligament and vessel loop placed around this.  Circumflex iliac branches were dissected free circumferentially and vessel loops placed around these.  There was a bifid profunda femoris artery and these 2 branches were dissected free and controlled with vessel loops.  The profunda femoris was a fairly high takeoff approximately 3 cm below the inguinal ligament.  The superficial femoral artery was dissected  free circumferentially and the vessel loop placed around this.  Next, the greater saphenous vein was dissected free in the medial portion incision.  This was dissected free circumferentially and small side branches ligated and divided between silk ties or clips.  Ultrasound was used to identify the vein near the level of the knee and this had normal compressibility.  The greater saphenous vein was harvested through several skip incisions along the medial aspect of the left leg.  The incision near the knee was deepened down to the level of the above-knee popliteal space.  The vein was retracted posteriorly to protect this. The above-knee popliteal space was entered and popliteal artery dissected free circumferentially.  This was circumferentially thickened and somewhat calcified.  There were several small genicular branches that were ligated and divided between silk ties to allow exposure of the artery.  There were several interdigitating overlying small veins which were also ligated and divided between silk ties.  A suitable spot was found for clamping on the artery.  Next, the distal saphenous vein was ligated with 2 clips.  The vein was then brought up through the skip incisions to the level of the groin incision and the saphenofemoral junction was oversewn with 5-0 Prolene.  The vein was transected and then inspected and gently flushed and distended with  heparinized saline. There were a few 7-0 Prolene repair sutures placed.  The subsartorial tunnel was then created connecting the above-knee popliteal incision to the groin incision.  The patient was then given 8000 units of heparin. The patient was given an additional 3000 units of heparin during the course of the case.  The left common femoral artery was controlled proximally with a Cooley clamp.  Vessel loops were used to control the other branches.  Longitudinal opening was made in the common femoral artery just above the femoral  bifurcation through the heavily calcified area at the level of the femoral bifurcation.  There was really no flow- limiting stenosis into the profunda.  Both the large profunda branches had good backbleeding.  These were then reconfirmed.  Vein was brought up on the operative field, placed in a nonreversed configuration and this was sewn end of vein to side of artery using a running 5-0 Prolene suture.  Just prior to completion of anastomosis, it was fore bled, back bled and thoroughly flushed.  Anastomosis was secured,  clamps were released, there was pulsatile flow in the proximal vein immediately. The vein was fairly thin walled and did have a few varicosities but overall was of reasonable quality.  Valvulotome was then brought up in the operative field and all the valves were completely lysed and there was good pulsatile flow through the vein graft.  The vein graft was then marked for orientation and brought through the subsartorial tunnel.  The above-knee popliteal artery was then controlled proximally and distally with Henley clamps.  Longitudinal opening was made in the popliteal artery.  This had a widely patent lumen but was heavily calcified. Graft was cut to length and spatulated and sewn end of vein to side of artery using a running 6-0 Prolene suture.  Just prior to completion of anastomosis, there was fore bled, back bled and thoroughly flushed. Anastomosis was secured, clamps released as pulsatile flow in the bypass graft immediately.  One single repair stitch was placed on the lateral wall of the vein graft.  There was good Doppler flow at the posterior tibial artery level at this point, which is the patient's primary runoff vessel via the peroneal.  This augmented approximately 80% with clamping and unclamping of the graft.  Next, intraoperative arteriogram was performed using inflow occlusion.  A 21 gauge butterfly needle was introduced through the vein graft and with  proximal inflow occluded, a contrast angiogram was performed which showed a widely patent distal anastomosis with runoff via the tibioperoneal trunk which preoperatively have been known to be only a patent peroneal vessel.  The needle was removed and the hold repaired with a single 7-0 Prolene suture. Hemostasis was obtained in all incisions.  The patient was given 80 mg of protamine for heparin reversal.  Groin incision was then closed in multiple layers of running 2-0 and 3-0 Vicryl suture and a 4-0 Vicryl subcuticular stitch.  The above-knee incision was closed in similar fashion.  Saphenectomy incisions were then closed with running 3-0 and 4- 0 Vicryl subcuticular stitch.  The patient tolerated the procedure well and there were no complications.  Instrument, sponge, and needle counts were correct at the end of the case.  The patient was taken to the recovery room in stable condition.     Janetta Hora. Eliakim Tendler, MD     CEF/MEDQ  D:  12/22/2010  T:  12/23/2010  Job:  [id number]  Electronically Signed by Fabienne Bruns MD on 01/06/2011 08:23:54 AM

## 2011-01-06 NOTE — Op Note (Signed)
  NAME:  [name], [name] NO.:  [id number]  MEDICAL RECORD NO.:  [id number]           PATIENT TYPE:  O  LOCATION:  [facility]                         FACILITY:  [facility]  PHYSICIAN:  Janetta Hora. Idell Hissong, MD  DATE OF BIRTH:  1933-01-15  DATE OF PROCEDURE:  01/05/2011 DATE OF DISCHARGE:  01/05/2011                              OPERATIVE REPORT   PROCEDURE:  Ray amputation, left fourth toe.  PREOPERATIVE DIAGNOSIS:  Gangrene, left fourth toe.  POSTOPERATIVE DIAGNOSIS:  Gangrene, left fourth toe.  ANESTHESIA:  Local with regional block.  ASSISTANT:  Zenaida Niece, RNFA  OPERATIVE FINDINGS:  Necrotic left fourth toe including metatarsal head.  SPECIMENS:  Left fourth toe.  OPERATIVE DETAIL:  After obtaining informed consent, the patient was taken to the operating room.  The patient was placed in supine position on operating table.  The patient previously had a left lower extremity block placed by the anesthesia team.  Block was tested and the patient was still having some sensitivity in the toe.  Therefore an additional 20 mL of 1% lidocaine was infiltrated in the digital block fashion at the base of the left fourth toe.  Next a tennis racket shaped incision was made incorporating the base of the left fourth toe and up onto the level of the metatarsal head.  Incision was carried all the way down to level of bone.  The toe was removed at the level of the metatarsal head using bone cutter and sharp dissection.  The metatarsal head was very friable and this was debrided back to about the metatarsal shaft until there was good solid bony cortex.  All loose debris was irrigated and removed from the wound.  Skin edges were then reapproximated using interrupted 3-0 nylon sutures in a vertical mattress alternating with simple suture fashion.  The patient tolerated the procedure well and there were no complications.  Sponge and needle counts were correct at the end of the case.   The patient was taken to recovery room in stable condition.     Janetta Hora. Carlesha Seiple, MD     CEF/MEDQ  D:  01/05/2011  T:  01/05/2011  Job:  [id number]  Electronically Signed by Fabienne Bruns MD on 01/06/2011 08:24:05 AM

## 2011-01-06 NOTE — Discharge Summary (Signed)
NAME:  [name], [name] NO.:  [id number]  MEDICAL RECORD NO.:  [id number]           PATIENT TYPE:  I  LOCATION:  2002                         FACILITY:  [facility]  PHYSICIAN:  Janetta Hora. Fields, MD  DATE OF BIRTH:  1933/02/15  DATE OF ADMISSION:  12/19/2010 DATE OF DISCHARGE:  12/25/2010                              DISCHARGE SUMMARY   ADMISSION DIAGNOSIS:  Nonhealing wound, left foot.  HISTORY OF PRESENT ILLNESS:  This is a 77 year old white male with a 75- pack-year history of smoking and history of AAA repair with an endograft in 2008.  He developed left lower extremity fourth toe pain 2 weeks prior to admission that got progressively worse with radiculopathy of his left calf.  The patient was evaluated by his primary care physician, Dr. Para March, at the clinic.  ABIs were performed which were 0.49 on the left lower extremity and 0.89 on the right lower extremity.  The patient was referred to Dr. Darrick Penna who scheduled the patient for an arteriogram and possible angioplasty on the day of admission.  However, during the angiogram, the patient developed 6 runs of V-tach; therefore, the angioplasty was postponed and Cardiology was consulted.  At that time of examination, the patient denied any symptoms of TIA, chest pain, shortness of breath, abdominal pain, fever, chills, orthopnea, or amaurosis fugax.  HOSPITAL COURSE:  The patient was admitted to the hospital and underwent a Cardiology consult.  He was found to have a 6 beat run of V- tach without known history of coronary artery disease.  However, the patient has multiple risk factors with a Framingham cardiovascular score of 75% and with a EuroSCORE of 25% of perioperative possible complications.  The patient was cleared to undergo surgery, so on Dec 22, 2010, he was taken to the operating room where he underwent a left femoral to above-knee popliteal bypass using nonreversed left greater saphenous vein as well as  intraoperative arteriogram x1.  He tolerated the procedure well and was transported to the PACU in satisfactory condition.  On postoperative day #2, he underwent repeat ABIs and on the right, it was 0.88 and on the left 0.74 which was increased from his preoperative of 0.49 on the left.  He was started on antibiotics for possible cellulitis of his left big toe.  Otherwise, his postoperative course has included increasing ambulation as well as increasing intake of solids without difficulty.  DISCHARGE DIAGNOSES: 1. Peripheral vascular disease.     a.     Status post left femoral to above-knee popliteal bypass      using nonreverse left greater saphenous vein and intraoperative      arteriogram on Dec 22, 2010. 2. History of abdominal aortic aneurysm with endograft stenting in     2008. 3. A 50-pack-year history of smoking. 4. Hypertension. 5. History of transient ischemic attack in 2008. 6. History of moderate carotid stenosis with 60-99% stenosis of the     left internal carotid artery and 20-40% stenosis of the right     internal carotid artery. 7. History of 6-run beat of ventricular tachycardia with cardiac  workup this admission. 8. History of bilateral inguinal hernia repair in 2000. 9. History of benign prostatic hypertrophy with a history of     bronchitis. 10.History of nephrolithiasis. 11.History of asymptomatic cholelithiasis.  DISCHARGE MEDICATIONS: 1. Cipro 500 mg p.o. b.i.d. x2 weeks. 2. Oxycodone 5 mg one p.o. q.4-6 h. p.r.n. pain #30 no refills. 3. Acetaminophen 500 mg 1-2 tablets p.o. t.i.d. p.r.n. 4. Calcium carbonate one p.o. daily. 5. Captopril 50 mg one p.o. b.i.d. 6. Fenofibrate 160 mg p.o. nightly. 7. Metoprolol 100 mg one p.o. nightly. 8. Protonix 40 mg p.o. q.a.m. 9. Symbicort 1 inhalation b.i.d. 10.Tramadol 50 mg one p.o. q.4 h. p.r.n. pain. 11.Vitamin D3 - 5000 units q.a.m. 12.Vitamin E 400 units nightly.  FOLLOWUP:  The patient is to follow up  with Dr. Darrick Penna in 3-4 weeks.    ______________________________ Newton Pigg, PA   ______________________________ Janetta Hora. Fields, MD    SE/MEDQ  D:  12/25/2010  T:  12/25/2010  Job:  [id number]  cc:   Dwana Curd. Para March, M.D.  Electronically Signed by Newton Pigg PA on 12/30/2010 03:17:20 PM Electronically Signed by Fabienne Bruns MD on 01/06/2011 08:23:51 AM

## 2011-01-15 ENCOUNTER — Ambulatory Visit: Payer: Medicare Other | Admitting: Vascular Surgery

## 2011-01-22 ENCOUNTER — Ambulatory Visit (INDEPENDENT_AMBULATORY_CARE_PROVIDER_SITE_OTHER): Payer: Medicare Other | Admitting: Vascular Surgery

## 2011-01-22 DIAGNOSIS — L98499 Non-pressure chronic ulcer of skin of other sites with unspecified severity: Secondary | ICD-10-CM

## 2011-01-22 DIAGNOSIS — I739 Peripheral vascular disease, unspecified: Secondary | ICD-10-CM

## 2011-01-23 NOTE — Assessment & Plan Note (Signed)
OFFICE VISIT  [name], [name] DOB:  1933-06-29                                       01/22/2011 ZOXWR#:[id number]  The patient returns for followup today.  He previously underwent ray amputation of his left fourth toe on 01/05/2011.  Prior to that he underwent a left femoral to above knee popliteal bypass on 12/22/2010. At that time he was noted to have one vessel runoff via the peroneal artery.  He returns today for further followup.  He denies any claudication symptoms although he is not walking extensive distances.  Pain in his amputation site is essentially subsided at this point.  PHYSICAL EXAM:  Blood pressure is 177/69 in the left arm, heart rate 65 and regular, respirations 16.  Examination of the foot is pink, warm, well-perfused.  The toe amputation site is well-healed and all sutures were removed today.  He has a biphasic peroneal and posterior tibial Doppler signal with absent dorsalis pedis Doppler signal.  I believe the patient has a patent bypass.  His toe amputation has healed at this point.  Hopefully he will not need further intervention. He will return for followup duplex scanning of his bypass graft as the 3 month postop.  He will return sooner if he has any problems.  He should not need any further narcotic pain medication at this time.    Janetta Hora. Fields, MD Electronically Signed  CEF/MEDQ  D:  01/23/2011  T:  01/23/2011  Job:  [id number]  cc:   Dwana Curd. Para March, M.D.

## 2011-02-05 ENCOUNTER — Ambulatory Visit: Payer: Medicare Other | Admitting: Vascular Surgery

## 2011-02-10 ENCOUNTER — Encounter: Payer: Self-pay | Admitting: Family Medicine

## 2011-02-10 DIAGNOSIS — I739 Peripheral vascular disease, unspecified: Secondary | ICD-10-CM | POA: Insufficient documentation

## 2011-02-17 ENCOUNTER — Other Ambulatory Visit: Payer: Self-pay | Admitting: *Deleted

## 2011-02-17 ENCOUNTER — Ambulatory Visit (INDEPENDENT_AMBULATORY_CARE_PROVIDER_SITE_OTHER): Payer: Medicare Other | Admitting: Family Medicine

## 2011-02-17 ENCOUNTER — Encounter: Payer: Self-pay | Admitting: Family Medicine

## 2011-02-17 DIAGNOSIS — R05 Cough: Secondary | ICD-10-CM

## 2011-02-17 MED ORDER — CAPTOPRIL 50 MG PO TABS: 50.0000 mg | ORAL_TABLET | Freq: Two times a day (BID) | ORAL | Status: DC

## 2011-02-17 MED ORDER — AZITHROMYCIN 250 MG PO TABS: ORAL_TABLET | ORAL | Status: AC

## 2011-02-17 MED ORDER — FLUTICASONE PROPIONATE 50 MCG/ACT NA SUSP: 2.0000 | Freq: Every day | NASAL | Status: DC

## 2011-02-17 NOTE — Patient Instructions (Signed)
Start the nasal spray and the antibiotics today.  This should gradually improve.  Take care.

## 2011-02-17 NOTE — Progress Notes (Signed)
Cough and congestion for 1 month.  "I just can't wear it out."  Robitussen doesn't help.  Night isn't a problem- he isn't congested then.  During they day he is hacking and spitting.  Tickle in throat.  No fevers.   Not sob, off nasal steroid.  Recheck pulse ox 94-97% in RA.   S/p L 4th toe amputation per [facility]VS.  L thigh/leg healing well.    Meds, vitals, and allergies reviewed.   ROS: See HPI.  Otherwise, noncontributory.  GEN: nad, alert and oriented HEENT: mucous membranes moist, tm w/o erythema, nasal exam w/ erythema, yellow discharge noted,  OP with cobblestoning NECK: supple w/o LA CV: rrr.   PULM: ctab, no inc wob EXT: no edema SKIN: no acute rash, surgery sites healing well and L foot with 1+ DP pulse

## 2011-02-18 ENCOUNTER — Encounter: Payer: Self-pay | Admitting: Family Medicine

## 2011-02-18 DIAGNOSIS — R05 Cough: Secondary | ICD-10-CM | POA: Insufficient documentation

## 2011-02-18 NOTE — Assessment & Plan Note (Addendum)
But lungs are ctab.  Likely due to post nasal gtt.  Given the exam and the duration, will restart nasal steroid and use zmax in meantime.  D/w pt about stopping smoking.  He has cut down.  Nontoxic, f/u prn.  He'll check BP as outpatient and notify me if elevated persistently

## 2011-03-13 ENCOUNTER — Other Ambulatory Visit: Payer: Self-pay | Admitting: *Deleted

## 2011-03-13 MED ORDER — BUDESONIDE-FORMOTEROL FUMARATE 160-4.5 MCG/ACT IN AERO: 2.0000 | INHALATION_SPRAY | Freq: Two times a day (BID) | RESPIRATORY_TRACT | Status: DC

## 2011-03-31 ENCOUNTER — Other Ambulatory Visit: Payer: Self-pay | Admitting: *Deleted

## 2011-03-31 MED ORDER — FENOFIBRATE 160 MG PO TABS: 160.0000 mg | ORAL_TABLET | Freq: Every day | ORAL | Status: DC

## 2011-03-31 NOTE — Telephone Encounter (Signed)
Received faxed refill request from pharmacy. Refill sent in electronically. 

## 2011-04-02 ENCOUNTER — Encounter (INDEPENDENT_AMBULATORY_CARE_PROVIDER_SITE_OTHER): Payer: Medicare Other

## 2011-04-02 ENCOUNTER — Other Ambulatory Visit (INDEPENDENT_AMBULATORY_CARE_PROVIDER_SITE_OTHER): Payer: Medicare Other

## 2011-04-02 DIAGNOSIS — Z48812 Encounter for surgical aftercare following surgery on the circulatory system: Secondary | ICD-10-CM

## 2011-04-02 DIAGNOSIS — I739 Peripheral vascular disease, unspecified: Secondary | ICD-10-CM

## 2011-04-02 DIAGNOSIS — I6529 Occlusion and stenosis of unspecified carotid artery: Secondary | ICD-10-CM

## 2011-04-17 NOTE — Procedures (Unsigned)
CAROTID DUPLEX EXAM  INDICATION:  Follow up carotid disease.  HISTORY: Diabetes:  No. Cardiac:  No. Hypertension:  Yes. Smoking:  Yes. Previous Surgery:  Lower extremity revascularization. CV History: Amaurosis Fugax No, Paresthesias No, Hemiparesis No.                                      RIGHT             LEFT Brachial systolic pressure:         205               198 Brachial Doppler waveforms:         WNL               WNL Vertebral direction of flow:        Antegrade         Antegrade DUPLEX VELOCITIES (cm/sec) CCA peak systolic                   56                54 ECA peak systolic                   183               215 ICA peak systolic                   166               256 ICA end diastolic                   29                69 PLAQUE MORPHOLOGY:                  Heterogenous      Heterogenous PLAQUE AMOUNT:                      Mild              Moderate-to-severe PLAQUE LOCATION:                    ECA/ICA           ECA/ICA  IMPRESSION: 1. 1% to 39% right internal carotid artery stenosis. 2. 60% to 79% left internal carotid artery stenosis. 3. Bilateral vertebral arteries are within normal limits. 4. Bilateral external carotid arteries are abnormal. 5. Stable results compared to January 2012 exam.  ___________________________________________ Janetta Hora. Fields, MD  LT/MEDQ  D:  04/02/2011  T:  04/02/2011  Job:  [id number]

## 2011-04-17 NOTE — Procedures (Unsigned)
BYPASS GRAFT EVALUATION  INDICATION:  Followup left fem-pop graft placed 11/2010.  HISTORY: Diabetes:  No. Cardiac:  No. Hypertension:  Yes. Smoking:  Yes. Previous Surgery:  Left fem-pop graft, left fourth digit amputation.  SINGLE LEVEL ARTERIAL EXAM                              RIGHT              LEFT Brachial:                    205                198 Anterior tibial:             157                199 Posterior tibial:            204                217 Peroneal:                                       217 Ankle/brachial index:        1.0                1.06  PREVIOUS ABI:  Date:  12/11/2010  RIGHT:  0.89  LEFT:  0.49  LOWER EXTREMITY BYPASS GRAFT DUPLEX EXAM:  DUPLEX:  Widely patent left femoral above knee popliteal graft without evidence of restenosis or hyperplasia. All waveforms are triphasic. See attached diagram for details.  IMPRESSION:  Widely patent left femoral-popliteal  graft as described above.  ___________________________________________ Janetta Hora. Fields, MD  LT/MEDQ  D:  04/03/2011  T:  04/03/2011  Job:  [id number]

## 2011-04-28 ENCOUNTER — Other Ambulatory Visit: Payer: Self-pay | Admitting: *Deleted

## 2011-04-28 MED ORDER — FENOFIBRATE 160 MG PO TABS: 160.0000 mg | ORAL_TABLET | Freq: Every day | ORAL | Status: DC

## 2011-05-13 ENCOUNTER — Other Ambulatory Visit: Payer: Self-pay | Admitting: *Deleted

## 2011-05-13 MED ORDER — METOCLOPRAMIDE HCL 5 MG PO TABS: 5.0000 mg | ORAL_TABLET | ORAL | Status: DC

## 2011-06-01 ENCOUNTER — Ambulatory Visit (INDEPENDENT_AMBULATORY_CARE_PROVIDER_SITE_OTHER): Payer: Medicare Other | Admitting: Family Medicine

## 2011-06-01 ENCOUNTER — Encounter: Payer: Self-pay | Admitting: Family Medicine

## 2011-06-01 DIAGNOSIS — R05 Cough: Secondary | ICD-10-CM

## 2011-06-01 DIAGNOSIS — R059 Cough, unspecified: Secondary | ICD-10-CM

## 2011-06-01 MED ORDER — PREDNISONE 20 MG PO TABS: 20.0000 mg | ORAL_TABLET | Freq: Every day | ORAL | Status: AC

## 2011-06-01 MED ORDER — AZITHROMYCIN 250 MG PO TABS: ORAL_TABLET | ORAL | Status: AC

## 2011-06-01 MED ORDER — ALBUTEROL SULFATE HFA 108 (90 BASE) MCG/ACT IN AERS: 2.0000 | INHALATION_SPRAY | RESPIRATORY_TRACT | Status: DC | PRN

## 2011-06-01 NOTE — Progress Notes (Signed)
"  I got the crud." duration of symptoms: 4 days  rhinorrhea:yes congestion:yes ear pain:no sore throat: scratchy cough:yes Myalgias: no other concerns:no fevers. +post nasal gtt Smoking: 1/2 PPD, less than prev.    ROS: See HPI.  Otherwise negative.    Meds, vitals, and allergies reviewed.   GEN: nad, alert and oriented, no inc in wob, pleasant in conversation HEENT: mucous membranes moist, TM w/o erythema, nasal epithelium injected, OP with cobblestoning NECK: supple w/o LA CV: rrr. PULM: Exp wheeze and coarse BS but no inc wob.  No focal dec in BS ABD: soft, +bs

## 2011-06-01 NOTE — Assessment & Plan Note (Signed)
Likely COPD flare.  D/w pt about stopping smoking.  Will start oral prednisone, abx and use SABA prn.  Continue symbicort.  He isn't sob and has not inc in wob.  If either gets worse, then proceed to ER.  He agrees.  I asked him to schedule CPE for 2013.  Call back sooner prn.

## 2011-06-01 NOTE — Patient Instructions (Addendum)
Star the antibiotics today along with the prednisone.  Take the prednisone with food.  Use your regular inhaler (symbicort) along with the albuterol.  Albuterol- if you needed it, every 4 hours.   Symbicort- twice a day.  Rinse after use.   If you get very short of breath, go to the ER.   I would get a flu shot each fall (after you get well).

## 2011-07-08 ENCOUNTER — Other Ambulatory Visit: Payer: Self-pay | Admitting: *Deleted

## 2011-07-08 MED ORDER — METOPROLOL TARTRATE 100 MG PO TABS: 100.0000 mg | ORAL_TABLET | Freq: Every day | ORAL | Status: DC

## 2011-07-08 MED ORDER — METOCLOPRAMIDE HCL 5 MG PO TABS: 5.0000 mg | ORAL_TABLET | ORAL | Status: DC

## 2011-07-10 ENCOUNTER — Other Ambulatory Visit: Payer: Self-pay | Admitting: *Deleted

## 2011-07-10 MED ORDER — PANTOPRAZOLE SODIUM 40 MG PO TBEC: 40.0000 mg | DELAYED_RELEASE_TABLET | Freq: Every day | ORAL | Status: DC

## 2011-08-19 ENCOUNTER — Other Ambulatory Visit: Payer: Self-pay | Admitting: *Deleted

## 2011-08-19 MED ORDER — METOCLOPRAMIDE HCL 5 MG PO TABS: 5.0000 mg | ORAL_TABLET | ORAL | Status: DC

## 2011-09-15 ENCOUNTER — Other Ambulatory Visit: Payer: Self-pay | Admitting: *Deleted

## 2011-09-15 MED ORDER — CAPTOPRIL 50 MG PO TABS: 50.0000 mg | ORAL_TABLET | Freq: Two times a day (BID) | ORAL | Status: DC

## 2011-09-22 ENCOUNTER — Encounter: Payer: Self-pay | Admitting: Family Medicine

## 2011-09-22 ENCOUNTER — Ambulatory Visit (INDEPENDENT_AMBULATORY_CARE_PROVIDER_SITE_OTHER): Payer: Medicare Other | Admitting: Family Medicine

## 2011-09-22 DIAGNOSIS — R05 Cough: Secondary | ICD-10-CM

## 2011-09-22 MED ORDER — DOXYCYCLINE HYCLATE 100 MG PO TABS: 100.0000 mg | ORAL_TABLET | Freq: Two times a day (BID) | ORAL | Status: AC

## 2011-09-22 NOTE — Progress Notes (Signed)
duration of symptoms: ~1 week rhinorrhea:yes congestion:yes ear pain: no sore throat: mild Cough: yes, increased, with sputum Myalgias: no other concerns: sinus drainage but no fevers.  Sob at baseline. Wheeze noted.  Wife was sick.   Cutting back smoking.   ROS: See HPI.  Otherwise negative.    Meds, vitals, and allergies reviewed.   GEN: nad, alert and oriented, no inc in wob, pleasant in conversation  HEENT: mucous membranes moist, TM w/o erythema, nasal epithelium injected, OP with cobblestoning  NECK: supple w/o LA  CV: rrr.  PULM: Exp wheeze and coarse BS but no inc wob. No focal dec in BS  ABD: soft, +bs  Small lesion- on R lower cheek.  This feel like a small boil, <1cm across

## 2011-09-22 NOTE — Patient Instructions (Signed)
Start the antibiotics today and use warm compresses on the spot on your face.  Let me know if you are more short of breath or if the spot doesn't get better.

## 2011-09-24 NOTE — Assessment & Plan Note (Signed)
Start doxy given his history, nontoxic, f/u prn.  Continue inhalers.  Will cover skin flora with doxy also, use warm compresses and notify me if not improved.  He agrees.

## 2011-10-01 ENCOUNTER — Other Ambulatory Visit: Payer: Self-pay | Admitting: *Deleted

## 2011-10-01 NOTE — Telephone Encounter (Signed)
Discrepancy in directions.   Refill request from pharmacy says:  Take 1/2 tablet by mouth before eating.  Our meds list says: Take 1 tablet by mouth as directed.  Take 1/2 tablet before eating as needed.  Patient says he is taking 1 tablet twice daily.  Please advise.

## 2011-10-02 MED ORDER — METOCLOPRAMIDE HCL 5 MG PO TABS: 5.0000 mg | ORAL_TABLET | Freq: Two times a day (BID) | ORAL | Status: DC

## 2011-10-02 NOTE — Telephone Encounter (Signed)
Would continue 1 po bid. Sent.

## 2011-10-12 ENCOUNTER — Other Ambulatory Visit: Payer: Self-pay | Admitting: *Deleted

## 2011-10-12 MED ORDER — ALBUTEROL SULFATE HFA 108 (90 BASE) MCG/ACT IN AERS: 2.0000 | INHALATION_SPRAY | RESPIRATORY_TRACT | Status: DC | PRN

## 2011-10-21 ENCOUNTER — Encounter: Payer: Self-pay | Admitting: Vascular Surgery

## 2011-10-22 ENCOUNTER — Encounter (INDEPENDENT_AMBULATORY_CARE_PROVIDER_SITE_OTHER): Payer: Medicare Other | Admitting: Vascular Surgery

## 2011-10-22 ENCOUNTER — Ambulatory Visit (INDEPENDENT_AMBULATORY_CARE_PROVIDER_SITE_OTHER): Payer: Medicare Other | Admitting: Vascular Surgery

## 2011-10-22 ENCOUNTER — Other Ambulatory Visit (INDEPENDENT_AMBULATORY_CARE_PROVIDER_SITE_OTHER): Payer: Medicare Other | Admitting: Vascular Surgery

## 2011-10-22 DIAGNOSIS — I714 Abdominal aortic aneurysm, without rupture, unspecified: Secondary | ICD-10-CM

## 2011-10-22 DIAGNOSIS — I739 Peripheral vascular disease, unspecified: Secondary | ICD-10-CM

## 2011-10-22 DIAGNOSIS — I6529 Occlusion and stenosis of unspecified carotid artery: Secondary | ICD-10-CM

## 2011-10-22 DIAGNOSIS — Z48812 Encounter for surgical aftercare following surgery on the circulatory system: Secondary | ICD-10-CM

## 2011-11-05 NOTE — Procedures (Unsigned)
BYPASS GRAFT EVALUATION  INDICATION:  Peripheral vascular disease.  HISTORY: Diabetes:  No. Cardiac:  No. Hypertension:  Yes. Smoking:  Currently. Previous Surgery:  Left femoral to popliteal artery bypass graft 12/22/2010; EVAR placed 08/31/2006; left 4th digit amputation 01/05/2011.  SINGLE LEVEL ARTERIAL EXAM                              RIGHT              LEFT Brachial: Anterior tibial: Posterior tibial: Peroneal: Ankle/brachial index:        0.97               0.93  PREVIOUS ABI:  Date: 04/02/2011  RIGHT:  1.0  LEFT:  1.06  LOWER EXTREMITY BYPASS GRAFT DUPLEX EXAM:  DUPLEX: 1. Aneurysmal dilatation noted at the left common femoral artery     measuring 1.58 cm x 1.74 cm. 2. Elevated velocity of greater than 200 cm/sec present in the     profunda femoral artery suggesting stenosis in the 50-75% range. 3. Aneurysmal dilatation of the distal anastomosis of the left femoral     to popliteal artery bypass graft measuring 1.23 cm x 1.17 cm with     distal graft measuring 0.64 cm x 0.60 cm just proximal to that     anastomosis.  IMPRESSION: 1. Patent left femoral popliteal artery bypass graft. 2. Aneurysmal dilatations present as noted above. 3. Native artery stenosis present as noted above. 4. Stable right ankle-brachial index and decreased left ankle-brachial     index since previous study on 04/02/2011.  ___________________________________________ Janetta Hora. Fields, MD  SH/MEDQ  D:  10/22/2011  T:  10/22/2011  Job:  [id number]

## 2011-11-05 NOTE — Procedures (Unsigned)
CAROTID DUPLEX EXAM  INDICATION:  Carotid stenosis.  HISTORY: Diabetes:  No Cardiac:  No Hypertension:  Yes Smoking:  Currently Previous Surgery:  No carotid intervention CV History:  Asymptomatic Amaurosis Fugax No, Paresthesias No, Hemiparesis No                                      RIGHT             LEFT Brachial systolic pressure:         200               196 Brachial Doppler waveforms:         WNL               WNL Vertebral direction of flow:        Antegrade         Antegrade DUPLEX VELOCITIES (cm/sec) CCA peak systolic                   54                63 ECA peak systolic                   209               197 ICA peak systolic                   124               254 ICA end diastolic                   21                62 PLAQUE MORPHOLOGY:                  Heterogenous      Calcified PLAQUE AMOUNT:                      Mild to moderate  Moderate to severe PLAQUE LOCATION:                    CCA/ICA/ECA       CCA/ICA/ECA  IMPRESSION: 1. Right internal carotid artery stenosis present in the 1% to 39%     range. 2. Bilateral external carotid artery stenosis present. 3. Left internal carotid artery stenosis present in the 60% to 79%     range. 4. Bilateral vertebral arteries are patent and antegrade. 5. Essentially unchanged since previous study on 04/02/2011.  ___________________________________________ Janetta Hora. Fields, MD  SH/MEDQ  D:  10/22/2011  T:  10/22/2011  Job:  [id number]

## 2011-11-05 NOTE — Procedures (Unsigned)
VASCULAR LAB EXAM  INDICATION:  Followup abdominal aortic aneurysm endograft placed 08/31/2006.  HISTORY: Diabetes:  No Cardiac:  No Hypertension:  Yes   EXAM:  AAA sac size                 3.61 CM AP         3.61 CM TRV Previous sac size dated 09/04/2010        3.24 CM AP         3.25 CM TRV   IMPRESSION: 1. The aorta and endograft appear patent. 2. The size of the aneurysmal sac surrounding the endograft is     essentially unchanged and most consistent with study done on     06/26/2009. 3. No evidence of endoleak was detected. 4. Elevated velocities present involving the left common iliac distal     segment, hypogastric, and proximal external iliac segments, with     peak systolic velocities greater than 200 cm/sec and heterogenous     plaque present.     ___________________________________________ Janetta Hora. Fields, MD  SH/MEDQ  D:  10/22/2011  T:  10/22/2011  Job:  [id number]

## 2011-11-17 ENCOUNTER — Other Ambulatory Visit: Payer: Self-pay | Admitting: *Deleted

## 2011-11-17 MED ORDER — BUDESONIDE-FORMOTEROL FUMARATE 160-4.5 MCG/ACT IN AERO: 2.0000 | INHALATION_SPRAY | Freq: Two times a day (BID) | RESPIRATORY_TRACT | Status: DC

## 2011-12-02 ENCOUNTER — Telehealth: Payer: Self-pay | Admitting: *Deleted

## 2011-12-02 MED ORDER — METOCLOPRAMIDE HCL 5 MG PO TABS: 5.0000 mg | ORAL_TABLET | Freq: Every day | ORAL | Status: DC | PRN

## 2011-12-02 NOTE — Telephone Encounter (Signed)
Received faxed refill request from pharmacy for Metoclopramide, take 1/2 tablet by mouth before eating. This does not match medication sheet. Called and spoke to patient and was advised that he takes 1 tablet by mouth daily as needed. Is it okay to change medication sheet to match how the patient actually takes the medication?

## 2011-12-02 NOTE — Telephone Encounter (Signed)
Please change the list to reflect current dose and send in 1 month supply with 2rf.  Thanks.

## 2011-12-02 NOTE — Telephone Encounter (Signed)
Medication list updated. Refill sent to pharmacy as instructed.

## 2011-12-09 ENCOUNTER — Other Ambulatory Visit: Payer: Self-pay | Admitting: Family Medicine

## 2011-12-09 DIAGNOSIS — I10 Essential (primary) hypertension: Secondary | ICD-10-CM

## 2011-12-14 ENCOUNTER — Other Ambulatory Visit (INDEPENDENT_AMBULATORY_CARE_PROVIDER_SITE_OTHER): Payer: Medicare Other

## 2011-12-14 DIAGNOSIS — I10 Essential (primary) hypertension: Secondary | ICD-10-CM

## 2011-12-14 DIAGNOSIS — M109 Gout, unspecified: Secondary | ICD-10-CM

## 2011-12-14 LAB — COMPREHENSIVE METABOLIC PANEL
ALT: 13 U/L (ref 0–53)
CO2: 29 mEq/L (ref 19–32)
Creatinine, Ser: 1.3 mg/dL (ref 0.4–1.5)
GFR: 56.42 mL/min — ABNORMAL LOW (ref >60.00)
Glucose, Bld: 100 mg/dL — ABNORMAL HIGH (ref 70–99)
Total Bilirubin: 0.5 mg/dL (ref 0.3–1.2)

## 2011-12-14 LAB — LIPID PANEL
Cholesterol: 167 mg/dL (ref 0–200)
HDL: 36.4 mg/dL — ABNORMAL LOW (ref >39.00)
Triglycerides: 136 mg/dL (ref 0.0–149.0)
VLDL: 27.2 mg/dL (ref 0.0–40.0)

## 2011-12-14 LAB — URIC ACID: Uric Acid, Serum: 5.4 mg/dL (ref 4.0–7.8)

## 2011-12-18 ENCOUNTER — Encounter: Payer: Self-pay | Admitting: Family Medicine

## 2011-12-18 ENCOUNTER — Ambulatory Visit (INDEPENDENT_AMBULATORY_CARE_PROVIDER_SITE_OTHER): Payer: Medicare Other | Admitting: Family Medicine

## 2011-12-18 VITALS — BP 192/78 | HR 52 | Temp 98.1°F | Resp 16 | Ht 76.0 in | Wt 194.0 lb

## 2011-12-18 DIAGNOSIS — K219 Gastro-esophageal reflux disease without esophagitis: Secondary | ICD-10-CM

## 2011-12-18 DIAGNOSIS — F17200 Nicotine dependence, unspecified, uncomplicated: Secondary | ICD-10-CM

## 2011-12-18 DIAGNOSIS — Z0000 Encounter for general adult medical examination without abnormal findings: Secondary | ICD-10-CM

## 2011-12-18 DIAGNOSIS — I739 Peripheral vascular disease, unspecified: Secondary | ICD-10-CM

## 2011-12-18 DIAGNOSIS — E785 Hyperlipidemia, unspecified: Secondary | ICD-10-CM

## 2011-12-18 DIAGNOSIS — I10 Essential (primary) hypertension: Secondary | ICD-10-CM

## 2011-12-18 NOTE — Progress Notes (Signed)
I have personally reviewed the Medicare Annual Wellness questionnaire and have noted 1. The patient's medical and social history 2. Their use of alcohol, tobacco or illicit drugs 3. Their current medications and supplements 4. The patient's functional ability including ADL's, fall risks, home safety risks and hearing or visual             impairment. 5. Diet and physical activities 6. Evidence for depression or mood disorders  The patients weight, height, BMI have been recorded in the chart and visual acuity is per eye clinic.   I have made referrals, counseling and provided education to the patient based review of the above and I have provided the pt with a written personalized care plan for preventive services.  CPE- See plan.  Routine anticipatory guidance given to patient.  See health maintenance. He has a living will.  Colonoscopy 2006 PSA per Dr. Earlene Plater Td 2016 PNA shot done prev Flu shot encouraged Shingles shot encouraged  Vasc disease.  Has seen vasc clinic, d/w pt about smoking.  He isn't ready to quit.   GERD, occ use of reglan and this was discussed.  Good effect, he is aware of potential TD/movement d/o from the medicine.  Hypertension:    Using medication without problems or lightheadedness: yes Chest pain with exertion:no Edema:no Short of breath:no Average home BPs: had been controlled on outside checks recently per patient He'll have episodic increases in BP that then normalize. Complaint with meds.    Elevated Cholesterol: Using medications without problems: yes Muscle aches: no Diet compliance: yes, in moderation.   Exercise: some, yardwork  Copd.  Still smoking.  Doesn't want to quit.  No dyspnea.  Compliant with meds.   Meds, vitals, and allergies reviewed.   ROS: See HPI.  Otherwise, noncontributory.  GEN: nad, alert and oriented HEENT: mucous membranes moist NECK: supple w/o LA CV: rrr.  PULM: ctab, no inc wob ABD: soft, +bs EXT: no  edema SKIN: no acute rash L 4th toe amputation noted DRE per uro.

## 2011-12-18 NOTE — Patient Instructions (Addendum)
I would get a flu shot each fall.   Check with your insurance to see if they will cover the shingles shot. Don't change your meds.  Call with questions.   Notify the pharmacy when you need refills.  Let me know if I can help you stop smoking.   Check your blood pressure a few times in the next week and notify me about the results.

## 2011-12-21 ENCOUNTER — Encounter: Payer: Self-pay | Admitting: Family Medicine

## 2011-12-21 DIAGNOSIS — Z0000 Encounter for general adult medical examination without abnormal findings: Secondary | ICD-10-CM | POA: Insufficient documentation

## 2011-12-21 NOTE — Assessment & Plan Note (Signed)
Prev controlled, continue meds, check at home and report back. He agrees.

## 2011-12-21 NOTE — Assessment & Plan Note (Signed)
He doesn't want to quit, discussed.

## 2011-12-21 NOTE — Assessment & Plan Note (Signed)
Continue current meds, risk/benefit discussed.

## 2011-12-21 NOTE — Assessment & Plan Note (Signed)
Aware of risk, he doesn't want to quit smoking.

## 2011-12-21 NOTE — Assessment & Plan Note (Signed)
Tolerating meds, labs discussed, continue meds.

## 2012-02-10 ENCOUNTER — Other Ambulatory Visit: Payer: Self-pay | Admitting: Family Medicine

## 2012-02-16 ENCOUNTER — Other Ambulatory Visit: Payer: Self-pay | Admitting: *Deleted

## 2012-02-16 MED ORDER — ALBUTEROL SULFATE HFA 108 (90 BASE) MCG/ACT IN AERS: 2.0000 | INHALATION_SPRAY | RESPIRATORY_TRACT | Status: DC | PRN

## 2012-03-03 ENCOUNTER — Other Ambulatory Visit: Payer: Self-pay

## 2012-03-03 MED ORDER — METOCLOPRAMIDE HCL 5 MG PO TABS: 5.0000 mg | ORAL_TABLET | Freq: Every day | ORAL | Status: DC | PRN

## 2012-03-03 NOTE — Telephone Encounter (Signed)
[facility] faxed refill Reglan # [phone] x 2.

## 2012-04-15 ENCOUNTER — Other Ambulatory Visit: Payer: Self-pay | Admitting: *Deleted

## 2012-04-15 MED ORDER — FENOFIBRATE 160 MG PO TABS: 160.0000 mg | ORAL_TABLET | Freq: Every day | ORAL | Status: DC

## 2012-05-04 ENCOUNTER — Encounter: Payer: Self-pay | Admitting: Neurosurgery

## 2012-05-05 ENCOUNTER — Encounter: Payer: Self-pay | Admitting: Neurosurgery

## 2012-05-05 ENCOUNTER — Other Ambulatory Visit (INDEPENDENT_AMBULATORY_CARE_PROVIDER_SITE_OTHER): Payer: Medicare Other | Admitting: *Deleted

## 2012-05-05 ENCOUNTER — Ambulatory Visit (INDEPENDENT_AMBULATORY_CARE_PROVIDER_SITE_OTHER): Payer: Medicare Other | Admitting: *Deleted

## 2012-05-05 ENCOUNTER — Encounter (INDEPENDENT_AMBULATORY_CARE_PROVIDER_SITE_OTHER): Payer: Medicare Other | Admitting: *Deleted

## 2012-05-05 ENCOUNTER — Ambulatory Visit (INDEPENDENT_AMBULATORY_CARE_PROVIDER_SITE_OTHER): Payer: Medicare Other | Admitting: Neurosurgery

## 2012-05-05 VITALS — BP 194/80 | HR 63 | Resp 18 | Ht 76.0 in | Wt 195.0 lb

## 2012-05-05 DIAGNOSIS — I6529 Occlusion and stenosis of unspecified carotid artery: Secondary | ICD-10-CM

## 2012-05-05 DIAGNOSIS — Z48812 Encounter for surgical aftercare following surgery on the circulatory system: Secondary | ICD-10-CM

## 2012-05-05 DIAGNOSIS — I714 Abdominal aortic aneurysm, without rupture, unspecified: Secondary | ICD-10-CM

## 2012-05-05 DIAGNOSIS — I739 Peripheral vascular disease, unspecified: Secondary | ICD-10-CM

## 2012-05-05 DIAGNOSIS — I7092 Chronic total occlusion of artery of the extremities: Secondary | ICD-10-CM

## 2012-05-05 NOTE — Addendum Note (Signed)
Addended by: Sharee Pimple on: 05/05/2012 03:37 PM   Modules accepted: Orders

## 2012-05-05 NOTE — Progress Notes (Signed)
[facility] Carotid Office Note  CC: Carotid PAD surveillance Referring Physician: Fields  History of Present Illness: 78 year old male patient of Dr. Darrick Penna status post left femoropopliteal bypass graft placed may of 2012. The patient has no history of carotid intervention. The patient denies any signs or symptoms of CVA, TIA, amaurosis fugax or any neural deficit. The patient also denies any claudication, rest pain or open ulcerations of lower extremities.  Past Medical History  Diagnosis Date  . Hyperlipidemia   . Allergy   . COPD (chronic obstructive pulmonary disease)     via X-ray  . BPH (benign prostatic hyperplasia)     Hx of (Dr. Earlene Plater)  . Gallstones     via CT  . Cigarette smoker   . CVA (cerebral infarction)   . Degeneration of intervertebral disc, site unspecified   . Stricture and stenosis of esophagus   . Unspecified gastritis and gastroduodenitis without mention of hemorrhage   . Duodenitis without mention of hemorrhage   . Benign neoplasm of colon   . Diverticulosis of colon (without mention of hemorrhage)   . Abdominal aneurysm without mention of rupture     followed by [facility] (Dr. Darrick Penna)  . Gout, unspecified   . Personal history of urinary calculi   . Hyperglycemia   . Low back pain   . Nausea   . Carotid arterial disease 08/2010    on [place] 60-80% L ICA and <40% R ICA  . PAD (peripheral artery disease)   . Hypertension     ROS: [x]  Positive   [ ]  Denies    General: [ ]  Weight loss, [ ]  Fever, [ ]  chills Neurologic: [ ]  Dizziness, [ ]  Blackouts, [ ]  Seizure [ ]  Stroke, [ ]  "Mini stroke", [ ]  Slurred speech, [ ]  Temporary blindness; [ ]  weakness in arms or legs, [ ]  Hoarseness Cardiac: [ ]  Chest pain/pressure, [ ]  Shortness of breath at rest [ ]  Shortness of breath with exertion, [ ]  Atrial fibrillation or irregular heartbeat Vascular: [ ]  Pain in legs with walking, [ ]  Pain in legs at rest, [ ]  Pain in legs at night,  [ ]   Non-healing ulcer, [ ]  Blood clot in vein/DVT,   Pulmonary: [ ]  Home oxygen, [ ]  Productive cough, [ ]  Coughing up blood, [ ]  Asthma,  [ ]  Wheezing Musculoskeletal:  [ ]  Arthritis, [ ]  Low back pain, [ ]  Joint pain Hematologic: [ ]  Easy Bruising, [ ]  Anemia; [ ]  Hepatitis Gastrointestinal: [ ]  Blood in stool, [ ]  Gastroesophageal Reflux/heartburn, [ ]  Trouble swallowing Urinary: [ ]  chronic Kidney disease, [ ]  on HD - [ ]  MWF or [ ]  TTHS, [ ]  Burning with urination, [ ]  Difficulty urinating Skin: [ ]  Rashes, [ ]  Wounds Psychological: [ ]  Anxiety, [ ]  Depression   Social History History  Substance Use Topics  . Smoking status: Current Every Day Smoker -- 0.5 packs/day for 50 years    Types: Cigarettes  . Smokeless tobacco: Never Used  . Alcohol Use: Yes     Very little    Family History Family History  Problem Relation Age of Onset  . Heart disease Mother     MI  . Aneurysm Father   . Hypertension Neg Hx   . Diabetes Neg Hx   . Arthritis Neg Hx   . Depression Neg Hx   . Alcohol abuse Neg Hx   . Drug abuse Neg Hx   .  Colon cancer Neg Hx   . Prostate cancer Neg Hx   . Cancer Brother     Lung  . Heart disease Brother     MI    Allergies  Allergen Reactions  . Aspirin     REACTION: GI intolerance  . Sulfonamide Derivatives     REACTION: nausea    Current Outpatient Prescriptions  Medication Sig Dispense Refill  . acetaminophen (TYLENOL) 325 MG tablet Take 650 mg by mouth every 6 (six) hours as needed.      Marland Kitchen albuterol (PROVENTIL HFA;VENTOLIN HFA) 108 (90 BASE) MCG/ACT inhaler Inhale 2 puffs into the lungs every 4 (four) hours as needed for wheezing.  18 g  3  . budesonide-formoterol (SYMBICORT) 160-4.5 MCG/ACT inhaler Inhale 2 puffs into the lungs 2 (two) times daily.  1 Inhaler  5  . captopril (CAPOTEN) 50 MG tablet Take 1 tablet (50 mg total) by mouth 2 (two) times daily.  60 tablet  10  . Cholecalciferol (VITAMIN D) 1000 UNITS capsule Take 1,000 Units by mouth  daily.        . [place]al Calcium 1000 (390 CA) MG TABS Take 1 tablet by mouth daily.      . fenofibrate 160 MG tablet Take 1 tablet (160 mg total) by mouth daily.  30 tablet  9  . metoCLOPramide (REGLAN) 5 MG tablet Take 1 tablet (5 mg total) by mouth daily as needed.  30 tablet  2  . metoprolol (LOPRESSOR) 100 MG tablet Take 1 tablet (100 mg total) by mouth daily.  30 tablet  11  . pantoprazole (PROTONIX) 40 MG tablet TAKE ONE (1) TABLET BY MOUTH EVERY      DAY  30 tablet  11  . vitamin E 200 UNIT capsule Take 200 Units by mouth daily.          Physical Examination  Filed Vitals:   05/05/12 1505  BP: 194/80  Pulse: 63  Resp:     Body mass index is 23.74 kg/(m^2).  General:  WDWN in NAD Gait: Normal HEENT: WNL Eyes: Pupils equal Pulmonary: normal non-labored breathing , without Rales, rhonchi,  wheezing Cardiac: RRR, without  Murmurs, rubs or gallops; Abdomen: soft, NT, no masses Skin: no rashes, ulcers noted  Vascular Exam Pulses: 2+ radial pulses bilaterally, PT and DP pulses are palpable bilaterally Carotid bruits: Carotid pulses to auscultation no bruits are heard Extremities without ischemic changes, no Gangrene , no cellulitis; no open wounds;  Musculoskeletal: no muscle wasting or atrophy   Neurologic: A&O X 3; Appropriate Affect ; SENSATION: normal; MOTOR FUNCTION:  moving all extremities equally. Speech is fluent/normal  Non-Invasive Vascular Imaging CAROTID DUPLEX 05/05/2012  Right ICA 20 - 39 % stenosis Left ICA 40 - 59 % stenosis ABIs today are 0.97 on the right and 0.93 on the left with a patent left lower extremity femoropopliteal bypass  ASSESSMENT/PLAN: Asymptomatic patient with no claudication doing well. The patient will followup in one year with repeat carotid duplex as well as lower extremity graft duplex and ABIs. The patient is in agreement with this plan, his questions were encouraged and answered.   Lauree Chandler ANP   Clinic MD: Darrick Penna

## 2012-05-19 ENCOUNTER — Telehealth: Payer: Self-pay | Admitting: Family Medicine

## 2012-05-19 MED ORDER — BUDESONIDE-FORMOTEROL FUMARATE 160-4.5 MCG/ACT IN AERO: 2.0000 | INHALATION_SPRAY | Freq: Two times a day (BID) | RESPIRATORY_TRACT | Status: DC

## 2012-05-19 NOTE — Telephone Encounter (Signed)
last fill 8.30.13  Faxed refill request.

## 2012-05-19 NOTE — Telephone Encounter (Signed)
Refill- budesonide-formoterol(symbicort) 160-4.8mcg/act inahler. Inhale 2 puffs into the lunch two times daily. Qty 1 last fill 8.30.13

## 2012-06-17 ENCOUNTER — Other Ambulatory Visit: Payer: Self-pay | Admitting: *Deleted

## 2012-06-17 NOTE — Telephone Encounter (Signed)
Faxed refill request.  Patient was prescribed on 02/16/12 with 2 RF.  Please advise.

## 2012-06-19 MED ORDER — ALBUTEROL SULFATE HFA 108 (90 BASE) MCG/ACT IN AERS: 2.0000 | INHALATION_SPRAY | RESPIRATORY_TRACT | Status: DC | PRN

## 2012-06-19 MED ORDER — METOCLOPRAMIDE HCL 5 MG PO TABS: 5.0000 mg | ORAL_TABLET | Freq: Every day | ORAL | Status: DC | PRN

## 2012-06-19 NOTE — Telephone Encounter (Signed)
Sent!

## 2012-07-07 ENCOUNTER — Other Ambulatory Visit: Payer: Self-pay

## 2012-07-07 NOTE — Telephone Encounter (Signed)
[facility] faxed refill Metoprolol 100 mg request; pt last seen 12/18/11 BP 192/78 P 52. AVS instructions pt to check BP over 1 week period and call with results; do not see note about f/u BP's .Please advise.

## 2012-07-08 MED ORDER — METOPROLOL TARTRATE 100 MG PO TABS: 100.0000 mg | ORAL_TABLET | Freq: Every day | ORAL | Status: DC

## 2012-07-08 NOTE — Telephone Encounter (Signed)
Scheduled appt 07/19/2012 at 8:15.

## 2012-07-08 NOTE — Telephone Encounter (Signed)
Sent for 1 month. Needs OV next week.  Needs to check BP several times and bring numbers to OV.  BP was reportedly up at [facility] OV recently.  Thanks.

## 2012-07-19 ENCOUNTER — Ambulatory Visit (INDEPENDENT_AMBULATORY_CARE_PROVIDER_SITE_OTHER): Payer: Medicare Other | Admitting: Family Medicine

## 2012-07-19 ENCOUNTER — Encounter: Payer: Self-pay | Admitting: Family Medicine

## 2012-07-19 VITALS — BP 178/80 | HR 76 | Temp 98.2°F | Wt 196.0 lb

## 2012-07-19 DIAGNOSIS — I10 Essential (primary) hypertension: Secondary | ICD-10-CM

## 2012-07-19 DIAGNOSIS — Z23 Encounter for immunization: Secondary | ICD-10-CM

## 2012-07-19 DIAGNOSIS — L570 Actinic keratosis: Secondary | ICD-10-CM

## 2012-07-19 DIAGNOSIS — I739 Peripheral vascular disease, unspecified: Secondary | ICD-10-CM

## 2012-07-19 NOTE — Progress Notes (Addendum)
Hypertension:    Using medication without problems or lightheadedness: yes Chest pain with exertion:no Edema:no Short of breath:no (still using inhalers) Average home BPs: not checked recently, had been lower out of clinic prev Other issues: "I feel good for an old man."    Smoking, 1/2 ppd.  "I'm trying to quit."  Discussed.    Had vasc surgery f/u and will have f/u again in a year.    AKs noted on hands and arms, encouraged derm f/u.    Meds, vitals, and allergies reviewed.   PMH and SH reviewed  ROS: See HPI.  Otherwise negative.    GEN: nad, alert and oriented HEENT: mucous membranes moist, B bruit noted NECK: supple w/o LA CV: rrr. PULM: ctab except for scant scattered exp wheeze B, no inc wob EXT: no edema SKIN: no acute rash, AKs noted on arms and hands

## 2012-07-19 NOTE — Assessment & Plan Note (Signed)
D/w pt about smoking.  See notes re: HTN.

## 2012-07-19 NOTE — Assessment & Plan Note (Signed)
He'll check BP out of clinic and notify us if elevated.  I suspect it is lower out of clinic as he had reported and I dont' want to induce hypotension.  He agrees with plan.

## 2012-07-19 NOTE — Assessment & Plan Note (Signed)
Encouraged derm f/u. ?

## 2012-07-19 NOTE — Patient Instructions (Addendum)
Call dermatology about the spots on your arms.  Check your BP out of the clinic and notify us if >140/>90. Take care.  Recheck in 6 months- labs before a physical.

## 2012-08-08 ENCOUNTER — Other Ambulatory Visit: Payer: Self-pay | Admitting: *Deleted

## 2012-08-08 MED ORDER — METOPROLOL TARTRATE 100 MG PO TABS: 100.0000 mg | ORAL_TABLET | Freq: Every day | ORAL | Status: DC

## 2012-09-08 ENCOUNTER — Other Ambulatory Visit: Payer: Self-pay | Admitting: *Deleted

## 2012-09-08 MED ORDER — CAPTOPRIL 50 MG PO TABS: 50.0000 mg | ORAL_TABLET | Freq: Two times a day (BID) | ORAL | Status: DC

## 2012-09-28 ENCOUNTER — Other Ambulatory Visit: Payer: Self-pay | Admitting: *Deleted

## 2012-09-28 MED ORDER — ALBUTEROL SULFATE HFA 108 (90 BASE) MCG/ACT IN AERS: 2.0000 | INHALATION_SPRAY | RESPIRATORY_TRACT | Status: DC | PRN

## 2012-11-02 ENCOUNTER — Other Ambulatory Visit: Payer: Self-pay | Admitting: *Deleted

## 2012-11-02 MED ORDER — METOCLOPRAMIDE HCL 5 MG PO TABS: 5.0000 mg | ORAL_TABLET | Freq: Every day | ORAL | Status: DC | PRN

## 2012-12-05 ENCOUNTER — Other Ambulatory Visit: Payer: Self-pay | Admitting: *Deleted

## 2012-12-05 MED ORDER — ALBUTEROL SULFATE HFA 108 (90 BASE) MCG/ACT IN AERS: 2.0000 | INHALATION_SPRAY | RESPIRATORY_TRACT | Status: DC | PRN

## 2012-12-05 MED ORDER — BUDESONIDE-FORMOTEROL FUMARATE 160-4.5 MCG/ACT IN AERO: 2.0000 | INHALATION_SPRAY | Freq: Two times a day (BID) | RESPIRATORY_TRACT | Status: DC

## 2012-12-05 NOTE — Telephone Encounter (Signed)
Sent!

## 2012-12-05 NOTE — Telephone Encounter (Signed)
Faxed refill request.  Last filled 11/16/12.  Please advise.

## 2013-01-19 ENCOUNTER — Other Ambulatory Visit: Payer: Self-pay | Admitting: *Deleted

## 2013-01-19 MED ORDER — PANTOPRAZOLE SODIUM 40 MG PO TBEC: DELAYED_RELEASE_TABLET | ORAL | Status: DC

## 2013-02-01 ENCOUNTER — Other Ambulatory Visit: Payer: Self-pay | Admitting: *Deleted

## 2013-02-01 MED ORDER — METOCLOPRAMIDE HCL 5 MG PO TABS: 5.0000 mg | ORAL_TABLET | Freq: Every day | ORAL | Status: DC | PRN

## 2013-02-01 NOTE — Telephone Encounter (Signed)
Sent. Schedule a CPE, labs ahead of time.

## 2013-02-02 NOTE — Telephone Encounter (Signed)
Patient advised.

## 2013-02-16 ENCOUNTER — Other Ambulatory Visit: Payer: Self-pay | Admitting: *Deleted

## 2013-02-16 MED ORDER — FENOFIBRATE 160 MG PO TABS: 160.0000 mg | ORAL_TABLET | Freq: Every day | ORAL | Status: DC

## 2013-02-16 MED ORDER — METOPROLOL TARTRATE 100 MG PO TABS: 100.0000 mg | ORAL_TABLET | Freq: Every day | ORAL | Status: DC

## 2013-02-16 NOTE — Addendum Note (Signed)
Addended by: Baldomero Lamy on: 02/16/2013 01:21 PM   Modules accepted: Orders

## 2013-02-22 ENCOUNTER — Other Ambulatory Visit: Payer: Self-pay | Admitting: *Deleted

## 2013-02-22 MED ORDER — ALBUTEROL SULFATE HFA 108 (90 BASE) MCG/ACT IN AERS: 2.0000 | INHALATION_SPRAY | RESPIRATORY_TRACT | Status: DC | PRN

## 2013-03-08 ENCOUNTER — Other Ambulatory Visit: Payer: Self-pay | Admitting: Family Medicine

## 2013-03-08 DIAGNOSIS — M109 Gout, unspecified: Secondary | ICD-10-CM

## 2013-03-08 DIAGNOSIS — I639 Cerebral infarction, unspecified: Secondary | ICD-10-CM

## 2013-03-13 ENCOUNTER — Other Ambulatory Visit: Payer: Self-pay | Admitting: *Deleted

## 2013-03-13 MED ORDER — METOCLOPRAMIDE HCL 5 MG PO TABS: 5.0000 mg | ORAL_TABLET | Freq: Every day | ORAL | Status: DC | PRN

## 2013-03-13 NOTE — Telephone Encounter (Signed)
Faxed refill request. Please advise.  

## 2013-03-13 NOTE — Telephone Encounter (Signed)
Sent!

## 2013-03-23 ENCOUNTER — Other Ambulatory Visit (INDEPENDENT_AMBULATORY_CARE_PROVIDER_SITE_OTHER): Payer: Medicare Other

## 2013-03-23 DIAGNOSIS — I6350 Cerebral infarction due to unspecified occlusion or stenosis of unspecified cerebral artery: Secondary | ICD-10-CM

## 2013-03-23 DIAGNOSIS — M109 Gout, unspecified: Secondary | ICD-10-CM

## 2013-03-23 DIAGNOSIS — I639 Cerebral infarction, unspecified: Secondary | ICD-10-CM

## 2013-03-23 LAB — COMPREHENSIVE METABOLIC PANEL
ALT: 14 U/L (ref 0–53)
Alkaline Phosphatase: 38 U/L — ABNORMAL LOW (ref 39–117)
Sodium: 141 mEq/L (ref 135–145)
Total Bilirubin: 0.9 mg/dL (ref 0.3–1.2)
Total Protein: 7.6 g/dL (ref 6.0–8.3)

## 2013-03-23 LAB — LDL CHOLESTEROL, DIRECT: Direct LDL: 140.5 mg/dL

## 2013-03-23 LAB — LIPID PANEL
Total CHOL/HDL Ratio: 7
VLDL: 38.8 mg/dL (ref 0.0–40.0)

## 2013-03-30 ENCOUNTER — Encounter: Payer: Self-pay | Admitting: Family Medicine

## 2013-03-30 ENCOUNTER — Ambulatory Visit (INDEPENDENT_AMBULATORY_CARE_PROVIDER_SITE_OTHER): Payer: Medicare Other | Admitting: Family Medicine

## 2013-03-30 VITALS — BP 138/68 | HR 66 | Temp 97.7°F | Ht 76.0 in | Wt 193.0 lb

## 2013-03-30 DIAGNOSIS — I6350 Cerebral infarction due to unspecified occlusion or stenosis of unspecified cerebral artery: Secondary | ICD-10-CM

## 2013-03-30 DIAGNOSIS — L570 Actinic keratosis: Secondary | ICD-10-CM

## 2013-03-30 DIAGNOSIS — R799 Abnormal finding of blood chemistry, unspecified: Secondary | ICD-10-CM

## 2013-03-30 DIAGNOSIS — F17200 Nicotine dependence, unspecified, uncomplicated: Secondary | ICD-10-CM

## 2013-03-30 DIAGNOSIS — E785 Hyperlipidemia, unspecified: Secondary | ICD-10-CM

## 2013-03-30 DIAGNOSIS — Z0000 Encounter for general adult medical examination without abnormal findings: Secondary | ICD-10-CM

## 2013-03-30 DIAGNOSIS — I714 Abdominal aortic aneurysm, without rupture: Secondary | ICD-10-CM

## 2013-03-30 DIAGNOSIS — I10 Essential (primary) hypertension: Secondary | ICD-10-CM

## 2013-03-30 DIAGNOSIS — R7989 Other specified abnormal findings of blood chemistry: Secondary | ICD-10-CM

## 2013-03-30 MED ORDER — CAPTOPRIL 50 MG PO TABS: 50.0000 mg | ORAL_TABLET | Freq: Two times a day (BID) | ORAL | Status: DC

## 2013-03-30 MED ORDER — ALBUTEROL SULFATE HFA 108 (90 BASE) MCG/ACT IN AERS: 2.0000 | INHALATION_SPRAY | RESPIRATORY_TRACT | Status: DC | PRN

## 2013-03-30 MED ORDER — METOPROLOL TARTRATE 100 MG PO TABS: 100.0000 mg | ORAL_TABLET | Freq: Every day | ORAL | Status: DC

## 2013-03-30 MED ORDER — BUDESONIDE-FORMOTEROL FUMARATE 160-4.5 MCG/ACT IN AERO: 2.0000 | INHALATION_SPRAY | Freq: Two times a day (BID) | RESPIRATORY_TRACT | Status: DC

## 2013-03-30 MED ORDER — METOCLOPRAMIDE HCL 5 MG PO TABS: 5.0000 mg | ORAL_TABLET | Freq: Every day | ORAL | Status: DC | PRN

## 2013-03-30 MED ORDER — FENOFIBRATE 160 MG PO TABS: 160.0000 mg | ORAL_TABLET | Freq: Every day | ORAL | Status: DC

## 2013-03-30 NOTE — Assessment & Plan Note (Signed)
Encouraged cessation.

## 2013-03-30 NOTE — Assessment & Plan Note (Signed)
Per vasc clinic.  Encouraged to stop smoking.

## 2013-03-30 NOTE — Patient Instructions (Addendum)
I would get a flu shot each fall.   Check with your insurance to see if they will cover the shingles shot. Come back for repeat labs to recheck your kidney function in about 2 weeks.  Don't change your meds for now.  Take care.  Let me know if I can help you stop smoking.

## 2013-03-30 NOTE — Assessment & Plan Note (Signed)
Continue attempts at secondary prevention.

## 2013-03-30 NOTE — Progress Notes (Signed)
I have personally reviewed the Medicare Annual Wellness questionnaire and have noted 1. The patient's medical and social history 2. Their use of alcohol, tobacco or illicit drugs 3. Their current medications and supplements 4. The patient's functional ability including ADL's, fall risks, home safety risks and hearing or visual             impairment. 5. Diet and physical activities 6. Evidence for depression or mood disorders  The patients weight, height, BMI have been recorded in the chart and visual acuity is per eye clinic.  I have made referrals, counseling and provided education to the patient based review of the above and I have provided the pt with a written personalized care plan for preventive services.  See scanned forms.  Routine anticipatory guidance given to patient. See health maintenance.  He has a living will.  Colonoscopy 2006  PSA per Dr. Everlean Cherry, declined now. Prostate cancer screening and PSA options (with potential risks and benefits of testing vs not testing) were discussed along with recent recs/guidelines.  He declined testing PSA at this point. Td done 2006  PNA shot done prev  Flu shot encouraged  Shingles shot encouraged   Vasc disease. Has seen vasc clinic, d/w pt about smoking. He isn't ready to quit.   GERD, occ use of reglan and this was discussed. Good effect, he is aware of potential TD/movement d/o from the medicine.   Hypertension:  Using medication without problems or lightheadedness: yes  Chest pain with exertion:no  Edema:no  Short of breath:no   Elevated Cholesterol:  Using medications without problems: yes  Muscle aches: no  Diet compliance: yes, in moderation.   Exercise: some, yardwork   Copd. Still smoking. Doesn't want to quit. No worsening dyspnea. Compliant with meds.   He had f/u with derm re: likely AKs and SKs on arms.   Elevated Cr d/w pt. He has had similar in the past.  On ACE.    PMH and SH reviewed  Meds, vitals,  and allergies reviewed.   ROS: See HPI.  Otherwise negative.    GEN: nad, alert and oriented HEENT: mucous membranes moist NECK: supple w/o LA CV: rrr. PULM: ctab except for scattered wheeze B, no inc wob ABD: soft, +bs EXT: no edema SKIN: no acute rash but AKs and SKs noted on arms (derm f/u encouraged)

## 2013-03-30 NOTE — Assessment & Plan Note (Signed)
Continue current meds, needs to work on diet and weight.

## 2013-03-31 DIAGNOSIS — R7989 Other specified abnormal findings of blood chemistry: Secondary | ICD-10-CM | POA: Insufficient documentation

## 2013-03-31 NOTE — Assessment & Plan Note (Signed)
Encouraged derm f/u. ?

## 2013-03-31 NOTE — Assessment & Plan Note (Signed)
No change in meds, likely multifactorial, recheck in about 2 weeks. Dw pt.

## 2013-03-31 NOTE — Assessment & Plan Note (Signed)
See scanned forms.  Routine anticipatory guidance given to patient. See health maintenance.  He has a living will.  Colonoscopy 2006  PSA per Dr. Everlean Cherry, declined now. Prostate cancer screening and PSA options (with potential risks and benefits of testing vs not testing) were discussed along with recent recs/guidelines.  He declined testing PSA at this point. Td done 2006  PNA shot done prev  Flu shot encouraged  Shingles shot encouraged

## 2013-03-31 NOTE — Assessment & Plan Note (Signed)
reasonable control today.

## 2013-04-13 ENCOUNTER — Other Ambulatory Visit (INDEPENDENT_AMBULATORY_CARE_PROVIDER_SITE_OTHER): Payer: Medicare Other

## 2013-04-13 ENCOUNTER — Other Ambulatory Visit: Payer: Medicare Other

## 2013-04-13 DIAGNOSIS — R799 Abnormal finding of blood chemistry, unspecified: Secondary | ICD-10-CM

## 2013-04-13 DIAGNOSIS — R7309 Other abnormal glucose: Secondary | ICD-10-CM

## 2013-04-13 DIAGNOSIS — R7989 Other specified abnormal findings of blood chemistry: Secondary | ICD-10-CM

## 2013-04-13 DIAGNOSIS — I10 Essential (primary) hypertension: Secondary | ICD-10-CM

## 2013-04-14 LAB — BASIC METABOLIC PANEL
BUN: 19 mg/dL (ref 6–23)
CO2: 32 mEq/L (ref 19–32)
Calcium: 9.4 mg/dL (ref 8.4–10.5)
Glucose, Bld: 87 mg/dL (ref 70–99)
Sodium: 138 mEq/L (ref 135–145)

## 2013-04-18 ENCOUNTER — Other Ambulatory Visit: Payer: Self-pay | Admitting: *Deleted

## 2013-04-18 DIAGNOSIS — Z48812 Encounter for surgical aftercare following surgery on the circulatory system: Secondary | ICD-10-CM

## 2013-04-18 DIAGNOSIS — I714 Abdominal aortic aneurysm, without rupture: Secondary | ICD-10-CM

## 2013-04-20 ENCOUNTER — Encounter: Payer: Self-pay | Admitting: Family

## 2013-04-21 ENCOUNTER — Other Ambulatory Visit (INDEPENDENT_AMBULATORY_CARE_PROVIDER_SITE_OTHER): Payer: Medicare Other | Admitting: *Deleted

## 2013-04-21 ENCOUNTER — Encounter: Payer: Self-pay | Admitting: Family

## 2013-04-21 ENCOUNTER — Encounter (INDEPENDENT_AMBULATORY_CARE_PROVIDER_SITE_OTHER): Payer: Medicare Other | Admitting: *Deleted

## 2013-04-21 ENCOUNTER — Ambulatory Visit (INDEPENDENT_AMBULATORY_CARE_PROVIDER_SITE_OTHER): Payer: Medicare Other | Admitting: Family

## 2013-04-21 VITALS — BP 192/72 | HR 53 | Resp 14 | Ht 75.5 in | Wt 193.0 lb

## 2013-04-21 DIAGNOSIS — Z48812 Encounter for surgical aftercare following surgery on the circulatory system: Secondary | ICD-10-CM

## 2013-04-21 DIAGNOSIS — I739 Peripheral vascular disease, unspecified: Secondary | ICD-10-CM

## 2013-04-21 DIAGNOSIS — I6529 Occlusion and stenosis of unspecified carotid artery: Secondary | ICD-10-CM

## 2013-04-21 DIAGNOSIS — I714 Abdominal aortic aneurysm, without rupture, unspecified: Secondary | ICD-10-CM

## 2013-04-21 DIAGNOSIS — I7092 Chronic total occlusion of artery of the extremities: Secondary | ICD-10-CM

## 2013-04-21 NOTE — Patient Instructions (Signed)
Smoking Cessation Quitting smoking is important to your health and has many advantages. However, it is not always easy to quit since nicotine is a very addictive drug. Often times, people try 3 times or more before being able to quit. This document explains the best ways for you to prepare to quit smoking. Quitting takes hard work and a lot of effort, but you can do it. ADVANTAGES OF QUITTING SMOKING  You will live longer, feel better, and live better.  Your body will feel the impact of quitting smoking almost immediately.  Within 20 minutes, blood pressure decreases. Your pulse returns to its normal level.  After 8 hours, carbon monoxide levels in the blood return to normal. Your oxygen level increases.  After 24 hours, the chance of having a heart attack starts to decrease. Your breath, hair, and body stop smelling like smoke.  After 48 hours, damaged nerve endings begin to recover. Your sense of taste and smell improve.  After 72 hours, the body is virtually free of nicotine. Your bronchial tubes relax and breathing becomes easier.  After 2 to 12 weeks, lungs can hold more air. Exercise becomes easier and circulation improves.  The risk of having a heart attack, stroke, cancer, or lung disease is greatly reduced.  After 1 year, the risk of coronary heart disease is cut in half.  After 5 years, the risk of stroke falls to the same as a nonsmoker.  After 10 years, the risk of lung cancer is cut in half and the risk of other cancers decreases significantly.  After 15 years, the risk of coronary heart disease drops, usually to the level of a nonsmoker.  If you are pregnant, quitting smoking will improve your chances of having a healthy baby.  The people you live with, especially any children, will be healthier.  You will have extra money to spend on things other than cigarettes. QUESTIONS TO THINK ABOUT BEFORE ATTEMPTING TO QUIT You may want to talk about your answers with your  caregiver.  Why do you want to quit?  If you tried to quit in the past, what helped and what did not?  What will be the most difficult situations for you after you quit? How will you plan to handle them?  Who can help you through the tough times? Your family? Friends? A caregiver?  What pleasures do you get from smoking? What ways can you still get pleasure if you quit? Here are some questions to ask your caregiver:  How can you help me to be successful at quitting?  What medicine do you think would be best for me and how should I take it?  What should I do if I need more help?  What is smoking withdrawal like? How can I get information on withdrawal? GET READY  Set a quit date.  Change your environment by getting rid of all cigarettes, ashtrays, matches, and lighters in your home, car, or work. Do not let people smoke in your home.  Review your past attempts to quit. Think about what worked and what did not. GET SUPPORT AND ENCOURAGEMENT You have a better chance of being successful if you have help. You can get support in many ways.  Tell your family, friends, and co-workers that you are going to quit and need their support. Ask them not to smoke around you.  Get individual, group, or telephone counseling and support. Programs are available at local hospitals and health centers. Call your local health department for   information about programs in your area.  Spiritual beliefs and practices may help some smokers quit.  Download a "quit meter" on your computer to keep track of quit statistics, such as how long you have gone without smoking, cigarettes not smoked, and money saved.  Get a self-help book about quitting smoking and staying off of tobacco. LEARN NEW SKILLS AND BEHAVIORS  Distract yourself from urges to smoke. Talk to someone, go for a walk, or occupy your time with a task.  Change your normal routine. Take a different route to work. Drink tea instead of coffee.  Eat breakfast in a different place.  Reduce your stress. Take a hot bath, exercise, or read a book.  Plan something enjoyable to do every day. Reward yourself for not smoking.  Explore interactive web-based programs that specialize in helping you quit. GET MEDICINE AND USE IT CORRECTLY Medicines can help you stop smoking and decrease the urge to smoke. Combining medicine with the above behavioral methods and support can greatly increase your chances of successfully quitting smoking.  Nicotine replacement therapy helps deliver nicotine to your body without the negative effects and risks of smoking. Nicotine replacement therapy includes nicotine gum, lozenges, inhalers, nasal sprays, and skin patches. Some may be available over-the-counter and others require a prescription.  Antidepressant medicine helps people abstain from smoking, but how this works is unknown. This medicine is available by prescription.  Nicotinic receptor partial agonist medicine simulates the effect of nicotine in your brain. This medicine is available by prescription. Ask your caregiver for advice about which medicines to use and how to use them based on your health history. Your caregiver will tell you what side effects to look out for if you choose to be on a medicine or therapy. Carefully read the information on the package. Do not use any other product containing nicotine while using a nicotine replacement product.  RELAPSE OR DIFFICULT SITUATIONS Most relapses occur within the first 3 months after quitting. Do not be discouraged if you start smoking again. Remember, most people try several times before finally quitting. You may have symptoms of withdrawal because your body is used to nicotine. You may crave cigarettes, be irritable, feel very hungry, cough often, get headaches, or have difficulty concentrating. The withdrawal symptoms are only temporary. They are strongest when you first quit, but they will go away within  10 14 days. To reduce the chances of relapse, try to:  Avoid drinking alcohol. Drinking lowers your chances of successfully quitting.  Reduce the amount of caffeine you consume. Once you quit smoking, the amount of caffeine in your body increases and can give you symptoms, such as a rapid heartbeat, sweating, and anxiety.  Avoid smokers because they can make you want to smoke.  Do not let weight gain distract you. Many smokers will gain weight when they quit, usually less than 10 pounds. Eat a healthy diet and stay active. You can always lose the weight gained after you quit.  Find ways to improve your mood other than smoking. FOR MORE INFORMATION  www.smokefree.gov  Document Released: 07/28/2001 Document Revised: 02/02/2012 Document Reviewed: 11/12/2011 [facility] Patient Information 2014 [place], [place].  Stroke Prevention Some medical conditions and behaviors are associated with an increased chance of having a stroke. You may prevent a stroke by making healthy choices and managing medical conditions. Reduce your risk of having a stroke by:  Staying physically active. Get at least 30 minutes of activity on most or all days.  Not smoking.  It may also be helpful to avoid exposure to secondhand smoke.  Limiting alcohol use. Moderate alcohol use is considered to be:  No more than 2 drinks per day for men.  No more than 1 drink per day for nonpregnant women.  Eating healthy foods.  Include 5 or more servings of fruits and vegetables a day.  Certain diets may be prescribed to address high blood pressure, high cholesterol, diabetes, or obesity.  Managing your cholesterol levels.  A low-saturated fat, low-trans fat, low-cholesterol, and high-fiber diet may control cholesterol levels.  Take any prescribed medicines to control cholesterol as directed by your caregiver.  Managing your diabetes.  A controlled-carbohydrate, controlled-sugar diet is recommended to manage  diabetes.  Take any prescribed medicines to control diabetes as directed by your caregiver.  Controlling your high blood pressure (hypertension).  A low-salt (sodium), low-saturated fat, low-trans fat, and low-cholesterol diet is recommended to manage high blood pressure.  Take any prescribed medicines to control hypertension as directed by your caregiver.  Maintaining a healthy weight.  A reduced-calorie, low-sodium, low-saturated fat, low-trans fat, low-cholesterol diet is recommended to manage weight.  Stopping drug abuse.  Avoiding birth control pills.  Talk to your caregiver about the risks of taking birth control pills if you are over 10 years old, smoke, get migraines, or have ever had a blood clot.  Getting evaluated for sleep disorders (sleep apnea).  Talk to your caregiver about getting a sleep evaluation if you snore a lot or have excessive sleepiness.  Taking medicines as directed by your caregiver.  For some people, aspirin or blood thinners (anticoagulants) are helpful in reducing the risk of forming abnormal blood clots that can lead to stroke. If you have the irregular heart rhythm of atrial fibrillation, you should be on a blood thinner unless there is a good reason you cannot take them.  Understand all your medicine instructions. SEEK IMMEDIATE MEDICAL CARE IF:   You have sudden weakness or numbness of the face, arm, or leg, especially on one side of the body.  You have sudden confusion.  You have trouble speaking (aphasia) or understanding.  You have sudden trouble seeing in one or both eyes.  You have sudden trouble walking.  You have dizziness.  You have a loss of balance or coordination.  You have a sudden, severe headache with no known cause.  You have new chest pain or an irregular heartbeat. Any of these symptoms may represent a serious problem that is an emergency. Do not wait to see if the symptoms will go away. Get medical help right away.  Call your local emergency services (911 in [place] Do not drive yourself to the hospital. Document Released: 09/10/2004 Document Revised: 10/26/2011 Document Reviewed: 03/23/2011 [facility] Patient Information 2014 [place], [place].  Peripheral Vascular Disease Peripheral Vascular Disease (PVD), also called Peripheral Arterial Disease (PAD), is a circulation problem caused by cholesterol (atherosclerotic plaque) deposits in the arteries. PVD commonly occurs in the lower extremities (legs) but it can occur in other areas of the body, such as your arms. The cholesterol buildup in the arteries reduces blood flow which can cause pain and other serious problems. The presence of PVD can place a person at risk for Coronary Artery Disease (CAD).  CAUSES  Causes of PVD can be many. It is usually associated with more than one risk factor such as:   High Cholesterol.  Smoking.  Diabetes.  Lack of exercise or inactivity.  High blood pressure (hypertension).  Obesity.  Family  history. SYMPTOMS   When the lower extremities are affected, patients with PVD may experience:  Leg pain with exertion or physical activity. This is called INTERMITTENT CLAUDICATION. This may present as cramping or numbness with physical activity. The location of the pain is associated with the level of blockage. For example, blockage at the abdominal level (distal abdominal aorta) may result in buttock or hip pain. Lower leg arterial blockage may result in calf pain.  As PVD becomes more severe, pain can develop with less physical activity.  In people with severe PVD, leg pain may occur at rest.  Other PVD signs and symptoms:  Leg numbness or weakness.  Coldness in the affected leg or foot, especially when compared to the other leg.  A change in leg color.  Patients with significant PVD are more prone to ulcers or sores on toes, feet or legs. These may take longer to heal or may reoccur. The ulcers or sores can become  infected.  If signs and symptoms of PVD are ignored, gangrene may occur. This can result in the loss of toes or loss of an entire limb.  Not all leg pain is related to PVD. Other medical conditions can cause leg pain such as:  Blood clots (embolism) or Deep Vein Thrombosis.  Inflammation of the blood vessels (vasculitis).  Spinal stenosis. DIAGNOSIS  Diagnosis of PVD can involve several different types of tests. These can include:  Pulse Volume Recording Method (PVR). This test is simple, painless and does not involve the use of X-rays. PVR involves measuring and comparing the blood pressure in the arms and legs. An ABI (Ankle-Brachial Index) is calculated. The normal ratio of blood pressures is 1. As this number becomes smaller, it indicates more severe disease.  < 0.95  indicates significant narrowing in one or more leg vessels.  <0.8 there will usually be pain in the foot, leg or buttock with exercise.  <0.4 will usually have pain in the legs at rest.  <0.25  usually indicates limb threatening PVD.  Doppler detection of pulses in the legs. This test is painless and checks to see if you have a pulses in your legs/feet.  A dye or contrast material (a substance that highlights the blood vessels so they show up on x-ray) may be given to help your caregiver better see the arteries for the following tests. The dye is eliminated from your body by the kidney's. Your caregiver may order blood work to check your kidney function and other laboratory values before the following tests are performed:  Magnetic Resonance Angiography (MRA). An MRA is a picture study of the blood vessels and arteries. The MRA machine uses a large magnet to produce images of the blood vessels.  Computed Tomography Angiography (CTA). A CTA is a specialized x-ray that looks at how the blood flows in your blood vessels. An IV may be inserted into your arm so contrast dye can be injected.  Angiogram. Is a procedure that  uses x-rays to look at your blood vessels. This procedure is minimally invasive, meaning a small incision (cut) is made in your groin. A small tube (catheter) is then inserted into the artery of your groin. The catheter is guided to the blood vessel or artery your caregiver wants to examine. Contrast dye is injected into the catheter. X-rays are then taken of the blood vessel or artery. After the images are obtained, the catheter is taken out. TREATMENT  Treatment of PVD involves many interventions which may include:  Lifestyle changes:  Quitting smoking.  Exercise.  Following a low fat, low cholesterol diet.  Control of diabetes.  Foot care is very important to the PVD patient. Good foot care can help prevent infection.  Medication:  Cholesterol-lowering medicine.  Blood pressure medicine.  Anti-platelet drugs.  Certain medicines may reduce symptoms of Intermittent Claudication.  Interventional/Surgical options:  Angioplasty. An Angioplasty is a procedure that inflates a balloon in the blocked artery. This opens the blocked artery to improve blood flow.  Stent Implant. A wire mesh tube (stent) is placed in the artery. The stent expands and stays in place, allowing the artery to remain open.  Peripheral Bypass Surgery. This is a surgical procedure that reroutes the blood around a blocked artery to help improve blood flow. This type of procedure may be performed if Angioplasty or stent implants are not an option. SEEK IMMEDIATE MEDICAL CARE IF:   You develop pain or numbness in your arms or legs.  Your arm or leg turns cold, becomes blue in color.  You develop redness, warmth, swelling and pain in your arms or legs. MAKE SURE YOU:   Understand these instructions.  Will watch your condition.  Will get help right away if you are not doing well or get worse. Document Released: 09/10/2004 Document Revised: 10/26/2011 Document Reviewed: 08/07/2008 [facility] Patient  Information 2014 [place], [place].   Abdominal Aortic Aneurysm  An aneurysm is the enlargement (dilatation), bulging, or ballooning out of part of the wall of a vein or artery. An aortic aneurysm is a bulging in the largest artery of the body. This artery supplies blood from the heart to the rest of the body.  The first part of the aorta is called the thoracic aorta. It leaves the heart, rises (ascends), arches, and goes down (descends) through the chest until it reaches the diaphragm. The diaphragm is the muscular part between the chest and abdomen.  The second part of the aorta is called the abdominal aorta after it has passed the diaphragm and continues down through the abdomen. The abdominal aorta ends where it splits to form the two iliac arteries that go to the legs. Aortic aneurysms can develop anywhere along the length of the aorta. The majority are located along the abdominal aorta. The major concern with an aortic aneurysm is that it can enlarge and rupture. This can cause death unless diagnosed and treated promptly. Aneurysms can also develop blood clots or infections. CAUSES  Many aortic aneurysms are caused by arteriosclerosis. Arteriosclerosis can weaken the aortic wall. The pressure of the blood being pumped through the aorta causes it to balloon out at the site of weakness. Therefore, high blood pressure (hypertension) is associated with aneurysm. Other risk factors include:  Age over 43.  Tobacco use.  Being male.  White race.  Family history of aneurysm.  Less frequent causes of abdominal aortic aneurysms include:  Connective tissue diseases.  Abdominal trauma.  Inflammation of blood vessles (arteritis).  Inherited (congenital) malformations.  Infection. SYMPTOMS  The signs and symptoms of an unruptured aneurysm will partly depend on its size and rate of growth.   Abdominal aortic aneurysms may cause pain. The pain typically has a deep quality as if it is piercing  into the person. It is felt most often in the lower back area. The pain is usually steady but may be relieved by changing your body position.  The person may also become aware of an abnormally prominent pulse in the belly (abdominal pulsation). DIAGNOSIS  An aortic aneurysm may be discovered by chance on physical exam, or on X-ray studies done for other reasons. It may be suspected because of other problems such as back or abdominal pain. The following tests may help identify the problem.  X-rays of the abdomen can show calcium deposits in the aneurysm wall.  CT scanning of the abdomen, particularly with contrast medium, is accurate at showing the exact size and shape of the aneurysm.  Ultrasounds give a clear picture of the size of an aneurysm (about 98% accuracy).  MRI scanning is accurate, but often unnecessary.  An abdominal angiogram shows the source of the major blood vessels arising from the aorta. It reveals the size and extent of any aneurysm. It can also show a clot clinging to the wall of the aneurysm (mural thrombus). TREATMENT  Treating an abdominal aortic aneurysm depends on the size. A rupture of an aneurysm is uncommon when they are less than 5 cm wide (2 inches). Rupture is far more common in aneurysms that are over 6 cm wide (2.4 inches).  Surgical repair is usually recommended for all aneurysms over 6 cm wide (2.4 inches). This depends on the health, age, and other circumstances of the individual. This type of surgery consists of opening the abdomen, removing the aneurysm, and sewing a synthetic graft (similar to a cloth tube) in its place. A less invasive form of this surgery, using stent grafts, is sometimes recommended.  For most patients, elective repair is recommended for aneurysms between 4 and 6 cm (1.6 and 2.4 inches). Elective means the surgery can be done at your convenience. This should not be put off too long if surgery is recommended.  If you smoke, stop  immediately. Smoking is a major risk factor for enlargement and rupture.  Medications may be used to help decrease complications  these include medicine to lower blood pressure and control cholesterol. HOME CARE INSTRUCTIONS   If you smoke, stop. Do not start smoking.  Take all medications as prescribed.  Your caregiver will tell you when to have your aneurysm rechecked, either by ultrasound or CT scan.  If your caregiver has given you a follow-up appointment, it is very important to keep that appointment. Not keeping the appointment could result in a chronic or permanent injury, pain, or disability. If there is any problem keeping the appointment, you must call back to this facility for assistance. SEEK MEDICAL CARE IF:   You develop mild abdominal pain or pressure.  You are able to feel or perceive your aneurysm, and you sense any change. SEEK IMMEDIATE MEDICAL CARE IF:   You develop severe abdominal pain, or severe pain moving (radiating) to your back.  You suddenly develop cold or blue toes or feet.  You suddenly develop lightheadedness or fainting spells. MAKE SURE YOU:   Understand these instructions.  Will watch your condition.  Will get help right away if you are not doing well or get worse. Document Released: 05/13/2005 Document Revised: 10/26/2011 Document Reviewed: 03/06/2008 [facility] Patient Information 2014 [place], [place].

## 2013-04-21 NOTE — Progress Notes (Signed)
[facility] HISTORY AND PHYSICAL   MRN : [id number]  History of Present Illness:   [name] is a 79 y.o. male  patient of Dr. Darrick Penna status post left femoropopliteal bypass graft placed may of 2012. The patient has no history of carotid intervention. He had left 4th toe amputation in 2012. He also had EVAR stent placement 08/31/2006 for AAA.    Pt. reports claudication at calves after walking about 1/4 mile before aching in calf muscles occurs.  Pt. denies rest pain, denies night pain, denies non healing ulcers on lower extremities. Patient denies recent symptoms  of TIA or stroke.  The patient denies amaurosis fugax or monocular blindness.  The patient  denies facial drooping.  Pt. denies hemiplegia.  The patient denies receptive or expressive aphasia.  Pt. denies weakness.  The patient has not had back or abdominal pain.  Pt Diabetic: No Pt smoker: smoker  (less than 1/2 ppd x 50 yrs)  Current Outpatient Prescriptions  Medication Sig Dispense Refill  . acetaminophen (TYLENOL) 325 MG tablet Take 650 mg by mouth every 6 (six) hours as needed.      Marland Kitchen albuterol (PROVENTIL HFA;VENTOLIN HFA) 108 (90 BASE) MCG/ACT inhaler Inhale 2 puffs into the lungs every 4 (four) hours as needed for wheezing.  18 g  12  . budesonide-formoterol (SYMBICORT) 160-4.5 MCG/ACT inhaler Inhale 2 puffs into the lungs 2 (two) times daily.  1 Inhaler  12  . captopril (CAPOTEN) 50 MG tablet Take 1 tablet (50 mg total) by mouth 2 (two) times daily.  60 tablet  12  . Cholecalciferol (VITAMIN D) 1000 UNITS capsule Take 1,000 Units by mouth daily.        . Coral Calcium 1000 (390 CA) MG TABS Take 1 tablet by mouth daily.      . fenofibrate 160 MG tablet Take 1 tablet (160 mg total) by mouth daily.  30 tablet  12  . metoCLOPramide (REGLAN) 5 MG tablet Take 1 tablet (5 mg total) by mouth daily as needed.  30 tablet  12  . metoprolol (LOPRESSOR) 100 MG tablet Take 1 tablet (100 mg total)  by mouth daily.  30 tablet  12  . pantoprazole (PROTONIX) 40 MG tablet TAKE ONE (1) TABLET BY MOUTH EVERY      DAY  30 tablet  11  . vitamin E 200 UNIT capsule Take 200 Units by mouth daily.         No current facility-administered medications for this visit.    Pt meds include: Statin :Yes Betablocker: Yes ASA: No, "it messes up my stomach" (including 81 mg) Other anticoagulants/antiplatelets: none  Past Medical History  Diagnosis Date  . Hyperlipidemia   . Allergy   . COPD (chronic obstructive pulmonary disease)     via X-ray  . BPH (benign prostatic hyperplasia)     Hx of (Dr. Earlene Plater)  . Gallstones     via CT  . Cigarette smoker   . CVA (cerebral infarction)   . Degeneration of intervertebral disc, site unspecified   . Stricture and stenosis of esophagus   . Unspecified gastritis and gastroduodenitis without mention of hemorrhage   . Duodenitis without mention of hemorrhage   . Benign neoplasm of colon   . Diverticulosis of colon (without mention of hemorrhage)   . Abdominal aneurysm without mention of rupture     followed by [facility] (Dr. Darrick Penna)  . Gout, unspecified   . Personal history of urinary calculi   .  Hyperglycemia   . Low back pain   . Nausea   . Carotid arterial disease 08/2010    on [place] 60-80% L ICA and <40% R ICA  . PAD (peripheral artery disease)   . Hypertension     Past Surgical History  Procedure Laterality Date  . Inguinal hernia repair  03/21/2002    Bilateral  . Esophagogastroduodenoscopy  12/15/2004    Duodenitis, gastritis, esoph stricture, path neg  . Ct of abdomen  10/09/2004    5.1 cm AAA gallstone; 7 cm. stone in the pole of the right kidney  . Ct of pelvis  10/09/2004    Normal except for divertic of bladder  . [place] of aaa  07/06/2005    Unchanged 4.9 cm. (Dr. Madilyn Fireman)  . [place] of aaa  12/24/2005    5.2 cm  . [place] of abdomen  06/24/2006    5.5 cm  . Adenosine myoview  07/13/2007    Normal  . Abdominal aortogram  07/29/2006    Infrafenal abdominal  aortic aneurysm with total occulision of inferior mesenteric artery  . Abdomen ct of angio  07/22/2006    Right kidney stone, L4/5/S1 DDD; AAA 5.3 cm. gallstones  . Aaa stent graft  08/31/2006    Right artery repair -- S/P Post op CVA  . Ct of head  09/01/2006    Small vessel changes  . Pre-surgery carotid US      Mod right ICA; mild left ICA stenosis  . Mri brain  09/01/2006    Normal  . Abdominal US  11/18/2006    Normal  . Toe amputation      L 4th 2012  . Femoral-popliteal bypass graft      left, 2012  . Cataract extraction      2012, bilateral    Social History History  Substance Use Topics  . Smoking status: Current Every Day Smoker -- 0.50 packs/day for 50 years    Types: Cigarettes  . Smokeless tobacco: Never Used  . Alcohol Use: Yes     Comment: Very little    Family History Family History  Problem Relation Age of Onset  . Heart disease Mother     MI  . Aneurysm Father   . Hypertension Neg Hx   . Diabetes Neg Hx   . Arthritis Neg Hx   . Depression Neg Hx   . Alcohol abuse Neg Hx   . Drug abuse Neg Hx   . Colon cancer Neg Hx   . Prostate cancer Neg Hx   . Cancer Brother     Lung  . Heart disease Brother     MI    Allergies  Allergen Reactions  . Aspirin     REACTION: GI intolerance  . Sulfonamide Derivatives     REACTION: nausea     REVIEW OF SYSTEMS  General: [ ]  Weight loss, [ ]  Fever, [ ]  chills Neurologic: [ ]  Dizziness, [ ]  Blackouts, [ ]  Seizure [ ]  Stroke, [ ]  "Mini stroke", [ ]  Slurred speech, [ ]  Temporary blindness; [ ]  weakness in arms or legs, [ ]  Hoarseness [ ]  Dysphagia Cardiac: [ ]  Chest pain/pressure, [ ]  Shortness of breath at rest [ ]  Shortness of breath with exertion, [ ]  Atrial fibrillation or irregular heartbeat  Vascular: [ ]  Pain in legs with walking, [ ]  Pain in legs at rest, [ ]  Pain in legs at night,  [ ]  Non-healing ulcer, [ ]  Blood clot in vein/DVT,  Pulmonary: [ ]  Home oxygen, [ ]  Productive cough, [ ]  Coughing up  blood, [ ]  Asthma,  [ ]  Wheezing [ ]  COPD Musculoskeletal:  [ ]  Arthritis, [ ]  Low back pain, [ ]  Joint pain Hematologic: Arly.Keller ] Easy Bruising, [ ]  Anemia; [ ]  Hepatitis Gastrointestinal: [ ]  Blood in stool, Arly.Keller ] Gastroesophageal Reflux/heartburn, Urinary: [ ]  chronic Kidney disease, [ ]  on HD - [ ]  MWF or [ ]  TTHS, [ ]  Burning with urination, [ ]  Difficulty urinating Skin: [ ]  Rashes, [ ]  Wounds Psychological: [ ]  Anxiety, [ ]  Depression  Physical Examination Filed Vitals:   04/21/13 1055 04/21/13 1058  BP: 200/85 192/72  Pulse: 50 53  Resp: 14   Height: 6' 3.5" (1.918 m)   Weight: 193 lb (87.544 kg)   SpO2: 97%    Body mass index is 23.8 kg/(m^2).  General:  WDWN in NAD Gait: Normal HENT: WNL Eyes: Pupils equal Pulmonary: normal non-labored breathing , without Rales, rhonchi, positive for insp. and expir. wheezes in all fields A&P. Cardiac: RRR, positive for murmer No carotid bruits Abdomen: soft, NT, no masses Skin: no rashes, ulcers noted;  no Gangrene , no cellulitis; no open wounds; positive for multiple small varicoities in ankles.  Vascular Exam/Pulses: VASCULAR EXAM  Carotid Bruits Left Right   Negative Negative                             VASCULAR EXAM: Extremities without ischemic changes, left 4th toe missing. without Gangrene of without open wounds, positive for 2+ bilateral pretibial pitting edema.                                                                                                          LE Pulses LEFT RIGHT       FEMORAL   palpable   palpable        POPLITEAL   palpable    palpable       POSTERIOR TIBIAL  not palpable   not palpable        DORSALIS PEDIS      ANTERIOR TIBIAL not palpable  not palpable       Musculoskeletal: no muscle wasting or atrophy; no edema, missing left 4th toe.  Neurologic: A&O X 3; Appropriate Affect ;  SENSATION: normal; MOTOR FUNCTION: 5/5 Symmetric, CN 2-12 intact, Speech is fluent/normal.  Non-Invasive  Vascular Imaging: AAA Duplex- Post Endovascular repair (04/21/2013) Aorta: 3.32 cm AP x 3.28 cm transverse (10/22/2011) 3.61 cm AP x 3.61 cm transverse   CAROTID DUPLEX 04/21/2013  <40% stenosis RICA 40-59% stenosis LICA   05/05/2012  Right ICA 20 - 39 % stenosis  Left ICA 40 - 59 % stenosis   ABIs: (05/05/2012) 0.97 on the right and 0.93 on the left with a patent left lower extremity femoropopliteal bypass (04/21/2013) 1.01 on the right, and 1.05 on the left with widely patent left fem-pop arterial BPG without evidence of hyperplasia or restenosis.  ASSESSMENT: No significant change compared  to previou AAA Duplex, no endo leak detected, diameter of aneurysm measures slightly smaller. No significant change in carotid arteries Duplex from previous study: minimal stenosis in right ICA, moderate stenosis in left ICA.  No significant change in left LE Duplex and bilateral ABI's compared to previous study a year ago: widely patent left fem-pop bypass graft, normal bilateral ABI's. He continues to smoke, admits to 1/2 PPD, states he knows he needs to quit and was resistant to hearing any of the benefits of quitting nor the increased risks should he continue to smoke.  He has claudication in both calves after walking about 1/4 mile which is exacerbated by smoking. Nevertheless he did listen somewhat and stated he would read the written smoking cessation information along with the information on PAD, AAA, and stroke prevention. He has inspiratory and expiratory wheezes in all lung fields which he did not seem to think was linked to his smoking but I told him it most likely was.   PLAN:  Repeat AAA Duplex, LE ABI's, and bilateral carotid Duplex in 1 year. I discussed in depth with the patient the nature of atherosclerosis, and emphasized the importance of maximal medical management including strict control of blood pressure, blood glucose, and lipid levels, obtaining regular exercise, and cessation  of smoking.  The patient is aware that without maximal medical management the underlying atherosclerotic disease process will progress, limiting the benefit of any interventions.   Charisse March, RN, MSN, FNP-C [facility] Office: [phone]  Clinic MD: Imogene Burn 04/21/2013 11:14 AM

## 2013-05-11 ENCOUNTER — Other Ambulatory Visit: Payer: Medicare Other

## 2013-05-11 ENCOUNTER — Ambulatory Visit: Payer: Medicare Other | Admitting: Family

## 2013-09-21 ENCOUNTER — Encounter: Payer: Self-pay | Admitting: Internal Medicine

## 2013-09-21 ENCOUNTER — Ambulatory Visit (INDEPENDENT_AMBULATORY_CARE_PROVIDER_SITE_OTHER): Payer: Medicare Other | Admitting: Internal Medicine

## 2013-09-21 ENCOUNTER — Ambulatory Visit (INDEPENDENT_AMBULATORY_CARE_PROVIDER_SITE_OTHER)
Admission: RE | Admit: 2013-09-21 | Discharge: 2013-09-21 | Disposition: A | Discharge status: Home / Self Care | Payer: Medicare Other | Source: Ambulatory Visit | Attending: Internal Medicine | Admitting: Internal Medicine

## 2013-09-21 VITALS — BP 148/80 | HR 70 | Temp 97.5°F | Resp 16 | Ht 76.0 in | Wt 190.0 lb

## 2013-09-21 DIAGNOSIS — R059 Cough, unspecified: Secondary | ICD-10-CM

## 2013-09-21 DIAGNOSIS — R222 Localized swelling, mass and lump, trunk: Secondary | ICD-10-CM

## 2013-09-21 DIAGNOSIS — I10 Essential (primary) hypertension: Secondary | ICD-10-CM

## 2013-09-21 DIAGNOSIS — R918 Other nonspecific abnormal finding of lung field: Secondary | ICD-10-CM

## 2013-09-21 DIAGNOSIS — R05 Cough: Secondary | ICD-10-CM

## 2013-09-21 DIAGNOSIS — J189 Pneumonia, unspecified organism: Secondary | ICD-10-CM

## 2013-09-21 MED ORDER — HYDROCODONE-HOMATROPINE 5-1.5 MG/5ML PO SYRP: 5.0000 mL | ORAL_SOLUTION | Freq: Three times a day (TID) | ORAL | Status: DC | PRN

## 2013-09-21 MED ORDER — CEFUROXIME AXETIL 500 MG PO TABS: 500.0000 mg | ORAL_TABLET | Freq: Two times a day (BID) | ORAL | Status: DC

## 2013-09-21 NOTE — Progress Notes (Signed)
Subjective:    Patient ID: [name], male    DOB: 05/04/1933, 80 y.o.   MRN: [id number]  Cough This is a new problem. The current episode started in the past 7 days. The problem has been gradually worsening. The cough is productive of purulent sputum. Associated symptoms include chills, a fever and shortness of breath. Pertinent negatives include no chest pain, ear congestion, ear pain, headaches, heartburn, hemoptysis, myalgias, nasal congestion, postnasal drip, rash, rhinorrhea, sore throat, sweats, weight loss or wheezing. Risk factors for lung disease include smoking/tobacco exposure. He has tried OTC cough suppressant for the symptoms. The treatment provided no relief. His past medical history is significant for COPD.      Review of Systems  Constitutional: Positive for fever and chills. Negative for weight loss, diaphoresis, activity change, appetite change, fatigue and unexpected weight change.  HENT: Negative.  Negative for ear pain, postnasal drip, rhinorrhea and sore throat.   Eyes: Negative.   Respiratory: Positive for cough and shortness of breath. Negative for apnea, hemoptysis, choking, chest tightness, wheezing and stridor.   Cardiovascular: Negative.  Negative for chest pain, palpitations and leg swelling.  Gastrointestinal: Negative.  Negative for heartburn, nausea, vomiting, abdominal pain, constipation and blood in stool.  Endocrine: Negative.   Genitourinary: Negative.   Musculoskeletal: Negative.  Negative for myalgias.  Skin: Negative.  Negative for rash.  Allergic/Immunologic: Negative.   Neurological: Negative.  Negative for headaches.  Hematological: Negative.  Negative for adenopathy. Does not bruise/bleed easily.  Psychiatric/Behavioral: Negative.        Objective:   Physical Exam  Vitals reviewed. Constitutional: He is oriented to person, place, and time. He appears well-developed and well-nourished.  Non-toxic appearance. He does not have a sickly  appearance. He does not appear ill. No distress.  HENT:  Head: Normocephalic and atraumatic.  Mouth/Throat: Oropharynx is clear and moist. No oropharyngeal exudate.  Eyes: Conjunctivae are normal. Right eye exhibits no discharge. Left eye exhibits no discharge. No scleral icterus.  Neck: Normal range of motion. Neck supple. No JVD present. No tracheal deviation present. No thyromegaly present.  Cardiovascular: Normal rate, regular rhythm and intact distal pulses.  Exam reveals no gallop and no friction rub.   No murmur heard. Pulmonary/Chest: Effort normal. No accessory muscle usage or stridor. Not tachypneic. No respiratory distress. He has no decreased breath sounds. He has no wheezes. He has rhonchi in the left lower field. He has no rales. He exhibits no tenderness.  Abdominal: Soft. Bowel sounds are normal. He exhibits no distension and no mass. There is no tenderness. There is no rebound and no guarding.  Musculoskeletal: Normal range of motion. He exhibits no edema and no tenderness.  Lymphadenopathy:    He has no cervical adenopathy.  Neurological: He is oriented to person, place, and time.  Skin: Skin is warm and dry. No rash noted. He is not diaphoretic. No erythema. No pallor.  Psychiatric: He has a normal mood and affect. His behavior is normal. Judgment and thought content normal.      Lab Results  Component Value Date   WBC 8.6 01/02/2011   HGB 11.9* 01/02/2011   HCT 35.3* 01/02/2011   PLT 295 01/02/2011   GLUCOSE 87 04/13/2013   CHOL 205* 03/23/2013   TRIG 194.0* 03/23/2013   HDL 30.20* 03/23/2013   LDLDIRECT 140.5 03/23/2013   LDLCALC 103* 12/14/2011   ALT 14 03/23/2013   AST 16 03/23/2013   NA 138 04/13/2013   K 4.3 04/13/2013  CL 104 04/13/2013   CREATININE 1.5 04/13/2013   BUN 19 04/13/2013   CO2 32 04/13/2013   TSH 0.497 12/19/2010   PSA 0.43 03/15/2009   MICROALBUR 5.1* 03/15/2009      Assessment & Plan:

## 2013-09-21 NOTE — Assessment & Plan Note (Signed)
pulm referral to consider biopsy

## 2013-09-21 NOTE — Patient Instructions (Signed)

## 2013-09-21 NOTE — Assessment & Plan Note (Signed)
I will treat the infection with ceftin and will control the cough with hycodan

## 2013-09-21 NOTE — Assessment & Plan Note (Signed)
CXR is + for 4 cm mass on left, will refer to pulm and order CT scan as this looks suspicious for malignancy

## 2013-09-22 ENCOUNTER — Other Ambulatory Visit (INDEPENDENT_AMBULATORY_CARE_PROVIDER_SITE_OTHER): Payer: Medicare Other

## 2013-09-22 ENCOUNTER — Ambulatory Visit (INDEPENDENT_AMBULATORY_CARE_PROVIDER_SITE_OTHER): Payer: Medicare Other | Admitting: Pulmonary Disease

## 2013-09-22 ENCOUNTER — Encounter: Payer: Self-pay | Admitting: Pulmonary Disease

## 2013-09-22 VITALS — BP 138/68 | HR 65 | Temp 98.5°F | Ht 76.0 in | Wt 191.8 lb

## 2013-09-22 DIAGNOSIS — R222 Localized swelling, mass and lump, trunk: Secondary | ICD-10-CM

## 2013-09-22 DIAGNOSIS — R918 Other nonspecific abnormal finding of lung field: Secondary | ICD-10-CM

## 2013-09-22 DIAGNOSIS — I10 Essential (primary) hypertension: Secondary | ICD-10-CM

## 2013-09-22 DIAGNOSIS — J449 Chronic obstructive pulmonary disease, unspecified: Secondary | ICD-10-CM

## 2013-09-22 NOTE — Patient Instructions (Addendum)
Follow up on CT scan of the chest We discussed possibilities Take antibiotic & cough syrup You have smoking related lung disease (COPD/ emphysema) Congratulations on quitting smoking

## 2013-09-22 NOTE — Assessment & Plan Note (Signed)
Follow up on CT scan of the chest  We discussed possibilities - rounded pneumonia/ abscess vs neoplasm

## 2013-09-22 NOTE — Assessment & Plan Note (Signed)
Take antibiotic & cough syrup You have smoking related lung disease (COPD/ emphysema) Smoking cessation primary intervention here Ct symbicort PFTs in the future once acute issues resolved

## 2013-09-22 NOTE — Progress Notes (Signed)
Subjective:    Patient ID: [name], male    DOB: 1933-05-15, 80 y.o.   MRN: [id number]  HPI 80 year old smoker referred for evaluation of abnormal chest x-ray. He has been maintained on Symbicort and albuterol for presumed COPD He did not take the flu shot this year. He developed pulmonary congestion and was given an antibiotic and a chest x-ray was obtained. This suggested a 4 cm masslike opacity in the left lower lobe, a CT chest with contrast has been scheduled for 2/12. He denies chest pain,hemoptysis, weight loss or wheezing. He reports cough productive of yellow-green sputum. He reports dyspnea on exertion at baseline.   Past Medical History  Diagnosis Date  . Hyperlipidemia   . Allergy   . COPD (chronic obstructive pulmonary disease)     via X-ray  . BPH (benign prostatic hyperplasia)     Hx of (Dr. Rosana Hoes)  . Gallstones     via CT  . Cigarette smoker   . CVA (cerebral infarction)   . Degeneration of intervertebral disc, site unspecified   . Stricture and stenosis of esophagus   . Unspecified gastritis and gastroduodenitis without mention of hemorrhage   . Duodenitis without mention of hemorrhage   . Benign neoplasm of colon   . Diverticulosis of colon (without mention of hemorrhage)   . Abdominal aneurysm without mention of rupture     followed by [facility] (Dr. Oneida Alar)  . Gout, unspecified   . Personal history of urinary calculi   . Hyperglycemia   . Low back pain   . Nausea   . Carotid arterial disease 08/2010    on [place] 60-80% L ICA and <40% R ICA  . PAD (peripheral artery disease)   . Hypertension     Past Surgical History  Procedure Laterality Date  . Inguinal hernia repair  03/21/2002    Bilateral  . Esophagogastroduodenoscopy  12/15/2004    Duodenitis, gastritis, esoph stricture, path neg  . Ct of abdomen  10/09/2004    5.1 cm AAA gallstone; 7 cm. stone in the pole of the right kidney  . Ct of pelvis  10/09/2004    Normal except for divertic of bladder  .  [place] of aaa  07/06/2005    Unchanged 4.9 cm. (Dr. Amedeo Plenty)  . [place] of aaa  12/24/2005    5.2 cm  . [place] of abdomen  06/24/2006    5.5 cm  . Adenosine myoview  07/13/2007    Normal  . Abdominal aortogram  07/29/2006    Infrafenal abdominal aortic aneurysm with total occulision of inferior mesenteric artery  . Abdomen ct of angio  07/22/2006    Right kidney stone, L4/5/S1 DDD; AAA 5.3 cm. gallstones  . Aaa stent graft  08/31/2006    Right artery repair -- S/P Post op CVA  . Ct of head  09/01/2006    Small vessel changes  . Pre-surgery carotid US      Mod right ICA; mild left ICA stenosis  . Mri brain  09/01/2006    Normal  . Abdominal US  11/18/2006    Normal  . Toe amputation      L 4th 2012  . Femoral-popliteal bypass graft      left, 2012  . Cataract extraction      2012, bilateral    Allergies  Allergen Reactions  . Aspirin     REACTION: GI intolerance  . Sulfonamide Derivatives     REACTION: nausea   History  Social History  . Marital Status: Married    Spouse Name: N/A    Number of Children: 1  . Years of Education: N/A   Occupational History  . [employer] and [place] History Main Topics  . Smoking status: Current Every Day Smoker -- 1.00 packs/day for 50 years    Types: Cigarettes    Start date: 09/22/2012  . Smokeless tobacco: Never Used  . Alcohol Use: No  . Drug Use: No  . Sexual Activity: Not on file   Other Topics Concern  . Not on file   Social History Narrative   Remarried 2008, lives with wife.   1 daughter in [place].   Likes to travel.   Family History  Problem Relation Age of Onset  . Heart disease Mother     MI  . Aneurysm Father   . Hypertension Neg Hx   . Diabetes Neg Hx   . Arthritis Neg Hx   . Depression Neg Hx   . Alcohol abuse Neg Hx   . Drug abuse Neg Hx   . Colon cancer Neg Hx   . Prostate cancer Neg Hx   . Cancer Brother     Lung  . Heart disease Brother     MI     Review  of Systems  Constitutional: Negative for fever and unexpected weight change.  HENT: Positive for rhinorrhea. Negative for congestion, dental problem, ear pain, nosebleeds, postnasal drip, sinus pressure, sneezing, sore throat and trouble swallowing.   Eyes: Negative for redness and itching.  Respiratory: Positive for cough, chest tightness, shortness of breath and wheezing.   Cardiovascular: Negative for palpitations and leg swelling.  Gastrointestinal: Negative for nausea and vomiting.  Genitourinary: Negative for dysuria.  Musculoskeletal: Negative for joint swelling.  Skin: Negative for rash.  Neurological: Negative for headaches.  Hematological: Does not bruise/bleed easily.  Psychiatric/Behavioral: Negative for dysphoric mood. The patient is not nervous/anxious.        Objective:   Physical Exam  Gen. Pleasant, thin man, in no distress, normal affect ENT - no lesions, no post nasal drip Neck: No JVD, no thyromegaly, no carotid bruits Lungs: no use of accessory muscles, no dullness to percussion, clear without rales or rhonchi  Cardiovascular: Rhythm regular, heart sounds  normal, no murmurs or gallops, no peripheral edema Abdomen: soft and non-tender, no hepatosplenomegaly, BS normal. Musculoskeletal: No deformities, no cyanosis or clubbing Neuro:  alert, non focal       Assessment & Plan:

## 2013-09-25 LAB — BASIC METABOLIC PANEL
BUN: 20 mg/dL (ref 6–23)
CALCIUM: 9 mg/dL (ref 8.4–10.5)
CO2: 26 mEq/L (ref 19–32)
Chloride: 108 mEq/L (ref 96–112)
Creatinine, Ser: 1.3 mg/dL (ref 0.4–1.5)
GFR: 54.71 mL/min — ABNORMAL LOW (ref >60.00)
Glucose, Bld: 139 mg/dL — ABNORMAL HIGH (ref 70–99)
POTASSIUM: 4.5 meq/L (ref 3.5–5.1)
Sodium: 145 mEq/L (ref 135–145)

## 2013-09-28 ENCOUNTER — Ambulatory Visit (INDEPENDENT_AMBULATORY_CARE_PROVIDER_SITE_OTHER)
Admission: RE | Admit: 2013-09-28 | Discharge: 2013-09-28 | Disposition: A | Discharge status: Home / Self Care | Payer: Medicare Other | Source: Ambulatory Visit | Attending: Internal Medicine | Admitting: Internal Medicine

## 2013-09-28 DIAGNOSIS — R918 Other nonspecific abnormal finding of lung field: Secondary | ICD-10-CM

## 2013-09-28 DIAGNOSIS — R222 Localized swelling, mass and lump, trunk: Secondary | ICD-10-CM

## 2013-09-28 MED ORDER — IOHEXOL 300 MG/ML  SOLN
80.0000 mL | Freq: Once | INTRAMUSCULAR | Status: AC | PRN
  Administered 2013-09-28: 80 mL via INTRAVENOUS

## 2013-09-29 ENCOUNTER — Encounter: Payer: Self-pay | Admitting: Pulmonary Disease

## 2013-09-29 ENCOUNTER — Ambulatory Visit (INDEPENDENT_AMBULATORY_CARE_PROVIDER_SITE_OTHER): Payer: Medicare Other | Admitting: Pulmonary Disease

## 2013-09-29 VITALS — BP 132/74 | HR 66 | Temp 97.4°F | Wt 190.4 lb

## 2013-09-29 DIAGNOSIS — J449 Chronic obstructive pulmonary disease, unspecified: Secondary | ICD-10-CM

## 2013-09-29 DIAGNOSIS — R222 Localized swelling, mass and lump, trunk: Secondary | ICD-10-CM

## 2013-09-29 DIAGNOSIS — R918 Other nonspecific abnormal finding of lung field: Secondary | ICD-10-CM

## 2013-09-29 MED ORDER — LEVOFLOXACIN 500 MG PO TABS: 500.0000 mg | ORAL_TABLET | Freq: Every day | ORAL | Status: DC

## 2013-09-29 MED ORDER — PREDNISONE 10 MG PO TABS: ORAL_TABLET | ORAL | Status: DC

## 2013-09-29 NOTE — Assessment & Plan Note (Signed)
Levaquin 500 daily x 7days Prednisone 10 mg tabs  Take 2 tabs daily with food x 5ds, then 1 tab daily with food x 5ds then STOP Take DELSYm cough syrup twice dialy as needed

## 2013-09-29 NOTE — Patient Instructions (Signed)
Levaquin 500 daily x 7days Prednisone 10 mg tabs  Take 2 tabs daily with food x 5ds, then 1 tab daily with food x 5ds then STOP Take DELSYm cough syrup twice dialy as needed PET scan will be scheduled

## 2013-09-29 NOTE — Assessment & Plan Note (Signed)
PET scan will be scheduled The various options of biopsy including bronchoscopy, CT guided needle aspiration and surgical biopsy were discussed.The risks of each procedure including coughing, bleeding and the  chances of lung puncture requiring chest tube were discussed in great detail. The benefits & alternatives including serial follow up were also discussed.

## 2013-10-02 NOTE — Assessment & Plan Note (Signed)
Spirometry in future

## 2013-10-02 NOTE — Progress Notes (Signed)
   Subjective:    Patient ID: [name], male    DOB: 05-29-1933, 80 y.o.   MRN: [id number]  HPI 80 year old smoker referred for evaluation of abnormal chest x-ray.  He has been maintained on Symbicort and albuterol for presumed COPD  He did not take the flu shot this year. He developed pulmonary congestion and was given an antibiotic and a chest x-ray was obtained. This suggested a 4 cm masslike opacity in the left lower lobe, a CT chest with contrast has been scheduled for 2/12.  He denies chest pain,hemoptysis, weight loss or wheezing. He reports cough productive of yellow-green sputum. He reports dyspnea on exertion at baseline. CT chest showed 4 cm macro lobulated mass in the left lower lobe with internal areas of lucency/cavitation. A 9 mm spiculated pulmonary nodule higher up in the left lower lobe &  Additional scattered bilateral pulmonary nodules were noted. Aneurysm of descending thoracic aorta with a maximal diameter of 3.3 cm noted  Chief Complaint  Patient presents with  . Follow-up    had CT yesterday. c/o prod ocugh w/ white-yelllow phlem, slight wheezing and chest tx. pt stopped abx and cough syrup. was not able to sleep taking it.      Past Medical History  Diagnosis Date  . Hyperlipidemia   . Allergy   . COPD (chronic obstructive pulmonary disease)     via X-ray  . BPH (benign prostatic hyperplasia)     Hx of (Dr. Rosana Hoes)  . Gallstones     via CT  . Cigarette smoker   . CVA (cerebral infarction)   . Degeneration of intervertebral disc, site unspecified   . Stricture and stenosis of esophagus   . Unspecified gastritis and gastroduodenitis without mention of hemorrhage   . Duodenitis without mention of hemorrhage   . Benign neoplasm of colon   . Diverticulosis of colon (without mention of hemorrhage)   . Abdominal aneurysm without mention of rupture     followed by [facility] (Dr. Oneida Alar)  . Gout, unspecified   . Personal history of urinary calculi   .  Hyperglycemia   . Low back pain   . Nausea   . Carotid arterial disease 08/2010    on [place] 60-80% L ICA and <40% R ICA  . PAD (peripheral artery disease)   . Hypertension        Review of Systems neg for any significant sore throat, dysphagia, itching, sneezing, nasal congestion or excess/ purulent secretions, fever, chills, sweats, unintended wt loss, pleuritic or exertional cp, hempoptysis, orthopnea pnd or change in chronic leg swelling. Also denies presyncope, palpitations, heartburn, abdominal pain, nausea, vomiting, diarrhea or change in bowel or urinary habits, dysuria,hematuria, rash, arthralgias, visual complaints, headache, numbness weakness or ataxia.     Objective:   Physical Exam  Gen. Pleasant, tall, thin man, in no distress, normal affect ENT - no lesions, no post nasal drip Neck: No JVD, no thyromegaly, no carotid bruits Lungs: no use of accessory muscles, no dullness to percussion, clear without rales or rhonchi  Cardiovascular: Rhythm regular, heart sounds  normal, no murmurs or gallops, no peripheral edema Abdomen: soft and non-tender, no hepatosplenomegaly, BS normal. Musculoskeletal: No deformities, no cyanosis or clubbing Neuro:  alert, non focal        Assessment & Plan:

## 2013-10-04 ENCOUNTER — Telehealth: Payer: Self-pay | Admitting: Pulmonary Disease

## 2013-10-04 NOTE — Telephone Encounter (Signed)
Spoke with the pt He has PET set for 10/09/13 He states he prefers to wait about 6 months to have this done He states "feeling much better" and not having any respiratory co's I advised that the scan needs to be done not b/c he was feeling bad, but b/c of the lung mass  He is aware of this, but states still wants to wait  RA, please advise thanks!

## 2013-10-04 NOTE — Telephone Encounter (Signed)
I spoke to patient He would like to defer the PEt scan - afraid of finding something or doing biopsy & 'spreading cancer' & m'messing me up' like what happened 'to my brother' I explained risks of waiting He finally agreed to have PET done in mid march -4  wks from CT scan Pl reschedule PET & appt

## 2013-10-04 NOTE — Telephone Encounter (Signed)
ATC line busy x 4

## 2013-10-04 NOTE — Telephone Encounter (Signed)
I called spoke w/ pt. appt scheduled with RA on 11/08/13. He will need PET done before appt. Pt would prefer the PET be done week of appt. Not any sooner. Please advise PCC's thanks

## 2013-10-05 NOTE — Telephone Encounter (Signed)
Called back and was given appt date Joellen Jersey

## 2013-10-05 NOTE — Telephone Encounter (Signed)
Scheduled pet 11/01/13@8am  [email or web address] Joellen Jersey

## 2013-10-06 ENCOUNTER — Other Ambulatory Visit: Payer: Self-pay | Admitting: Family

## 2013-10-06 DIAGNOSIS — I6529 Occlusion and stenosis of unspecified carotid artery: Secondary | ICD-10-CM

## 2013-10-06 DIAGNOSIS — Z48812 Encounter for surgical aftercare following surgery on the circulatory system: Secondary | ICD-10-CM

## 2013-10-06 DIAGNOSIS — I714 Abdominal aortic aneurysm, without rupture, unspecified: Secondary | ICD-10-CM

## 2013-10-06 DIAGNOSIS — I739 Peripheral vascular disease, unspecified: Secondary | ICD-10-CM

## 2013-10-09 ENCOUNTER — Ambulatory Visit (HOSPITAL_COMMUNITY): Payer: Medicare Other

## 2013-10-16 ENCOUNTER — Ambulatory Visit: Payer: Medicare Other | Admitting: Pulmonary Disease

## 2013-10-20 ENCOUNTER — Ambulatory Visit: Payer: Medicare Other | Admitting: Pulmonary Disease

## 2013-11-01 ENCOUNTER — Ambulatory Visit (HOSPITAL_COMMUNITY)
Admission: RE | Admit: 2013-11-01 | Discharge: 2013-11-01 | Disposition: A | Discharge status: Home / Self Care | Payer: Medicare Other | Source: Ambulatory Visit | Attending: Pulmonary Disease | Admitting: Pulmonary Disease

## 2013-11-01 ENCOUNTER — Encounter (HOSPITAL_COMMUNITY): Payer: Self-pay

## 2013-11-01 DIAGNOSIS — C3490 Malignant neoplasm of unspecified part of unspecified bronchus or lung: Secondary | ICD-10-CM | POA: Insufficient documentation

## 2013-11-01 DIAGNOSIS — R222 Localized swelling, mass and lump, trunk: Secondary | ICD-10-CM | POA: Insufficient documentation

## 2013-11-01 DIAGNOSIS — C771 Secondary and unspecified malignant neoplasm of intrathoracic lymph nodes: Secondary | ICD-10-CM | POA: Insufficient documentation

## 2013-11-01 DIAGNOSIS — R918 Other nonspecific abnormal finding of lung field: Secondary | ICD-10-CM | POA: Insufficient documentation

## 2013-11-01 DIAGNOSIS — N323 Diverticulum of bladder: Secondary | ICD-10-CM | POA: Insufficient documentation

## 2013-11-01 LAB — GLUCOSE, CAPILLARY: GLUCOSE-CAPILLARY: 93 mg/dL (ref 70–99)

## 2013-11-01 MED ORDER — FLUDEOXYGLUCOSE F - 18 (FDG) INJECTION
10.3000 | Freq: Once | INTRAVENOUS | Status: AC | PRN
  Administered 2013-11-01: 10.3 via INTRAVENOUS

## 2013-11-03 ENCOUNTER — Encounter: Payer: Self-pay | Admitting: Pulmonary Disease

## 2013-11-03 ENCOUNTER — Ambulatory Visit (INDEPENDENT_AMBULATORY_CARE_PROVIDER_SITE_OTHER): Payer: Medicare Other | Admitting: Pulmonary Disease

## 2013-11-03 VITALS — BP 158/70 | HR 61 | Temp 97.5°F | Ht 74.0 in | Wt 192.6 lb

## 2013-11-03 DIAGNOSIS — J4489 Other specified chronic obstructive pulmonary disease: Secondary | ICD-10-CM

## 2013-11-03 DIAGNOSIS — R222 Localized swelling, mass and lump, trunk: Secondary | ICD-10-CM

## 2013-11-03 DIAGNOSIS — I6529 Occlusion and stenosis of unspecified carotid artery: Secondary | ICD-10-CM

## 2013-11-03 DIAGNOSIS — J449 Chronic obstructive pulmonary disease, unspecified: Secondary | ICD-10-CM

## 2013-11-03 DIAGNOSIS — R918 Other nonspecific abnormal finding of lung field: Secondary | ICD-10-CM

## 2013-11-03 NOTE — Progress Notes (Signed)
   Subjective:    Patient ID: [name], male    DOB: Aug 14, 1933, 80 y.o.   MRN: [id number]  HPI  80 year old smoker  for FU of lung mass He has been maintained on Symbicort and albuterol for presumed COPD  CXR 09/21/2013 suggested a 4 cm mass-like opacity in the left lower lobe CT chest 09/28/13 with contrast showed 4 cm macro lobulated mass in the left lower lobe with internal areas of lucency/cavitation. A 9 mm spiculated pulmonary nodule higher up in the left lower lobe & Additional scattered bilateral pulmonary nodules were noted. Aneurysm of descending thoracic aorta with a maximal diameter of 3.3 cm noted   He required a lot of convincing to proceed with PEt scan - afraid of finding something or doing biopsy & 'spreading cancer' & 'messing me up' like what happened 'to my brother'  PET 11/02/13 showed hypermetabolic left lower lobe mass . Two hypermetabolic satellite nodules within the left lower lobe. Hypermetabolic subcarinal and contralateral hilar nodes were noted.  Spirometry showed severe airway obstruction with FEV1 of 0.83- 22%, ratio 40, and FVC of 42%. He desaturated to 88% on walking 3 laps around the office.  Past Medical History  Diagnosis Date  . Hyperlipidemia   . Allergy   . COPD (chronic obstructive pulmonary disease)     via X-ray  . BPH (benign prostatic hyperplasia)     Hx of (Dr. Rosana Hoes)  . Gallstones     via CT  . Cigarette smoker   . CVA (cerebral infarction)   . Degeneration of intervertebral disc, site unspecified   . Stricture and stenosis of esophagus   . Unspecified gastritis and gastroduodenitis without mention of hemorrhage   . Duodenitis without mention of hemorrhage   . Benign neoplasm of colon   . Diverticulosis of colon (without mention of hemorrhage)   . Abdominal aneurysm without mention of rupture     followed by [facility] (Dr. Oneida Alar)  . Gout, unspecified   . Personal history of urinary calculi   . Hyperglycemia   . Low back pain   .  Nausea   . Carotid arterial disease 08/2010    on [place] 60-80% L ICA and <40% R ICA  . PAD (peripheral artery disease)   . Hypertension      Review of Systems neg for any significant sore throat, dysphagia, itching, sneezing, nasal congestion or excess/ purulent secretions, fever, chills, sweats, unintended wt loss, pleuritic or exertional cp, hempoptysis, orthopnea pnd or change in chronic leg swelling. Also denies presyncope, palpitations, heartburn, abdominal pain, nausea, vomiting, diarrhea or change in bowel or urinary habits, dysuria,hematuria, rash, arthralgias, visual complaints, headache, numbness weakness or ataxia.     Objective:   Physical Exam  Gen. Pleasant, tall thin, in no distress, normal affect ENT - no lesions, no post nasal drip Neck: No JVD, no thyromegaly, no carotid bruits Lungs: no use of accessory muscles, no dullness to percussion,decreased without rales or rhonchi  Cardiovascular: Rhythm regular, heart sounds  normal, no murmurs or gallops, no peripheral edema Abdomen: soft and non-tender, no hepatosplenomegaly, BS normal. Musculoskeletal: No deformities, no cyanosis or clubbing Neuro:  alert, non focal       Assessment & Plan:

## 2013-11-03 NOTE — Patient Instructions (Signed)
You have severe COPD -lung capacity is at 20% Oxygen level does drop on walking You likely have advanced lung cancer - will need biopsy if we are to proceed with treatment options Call me back if you want [place] to proceed

## 2013-11-03 NOTE — Assessment & Plan Note (Addendum)
You likely have advanced lung cancer - will need biopsy if we are to proceed with treatment options Call me back if you want [place] to proceed If willing , proceed with EBUS of right hilar LNs & ENB of left lung mass - he will be high risk  The various options of biopsy including bronchoscopy, CT guided needle aspiration and surgical biopsy were discussed.The risks of each procedure including coughing, bleeding and the  chances of lung puncture requiring chest tube were discussed in great detail. The benefits & alternatives  were also discussed. I spent considerable time outlining the risk of waiting. He was adamant that he did not want to end up on oxygen. I also explained the poor prognosis of stage IIIB lung cancer without therapy.

## 2013-11-03 NOTE — Assessment & Plan Note (Signed)
You have severe COPD -lung capacity is at 20% Oxygen level does drop on walking -He absolutely does not want to go on oxygen Ct symbicort, consider adding spiriva in the future Does not want pulmonary rehab

## 2013-11-07 ENCOUNTER — Telehealth: Payer: Self-pay | Admitting: Pulmonary Disease

## 2013-11-07 NOTE — Telephone Encounter (Signed)
He is agreeable to proceed - Pl schedule EBUS + ENB on 4/8 - call me to confirm

## 2013-11-08 ENCOUNTER — Ambulatory Visit: Payer: Medicare Other | Admitting: Pulmonary Disease

## 2013-11-08 NOTE — Telephone Encounter (Signed)
enb-ebus 11/22/13@8 :30am Joellen Jersey

## 2013-11-09 NOTE — Telephone Encounter (Signed)
Pt aware enb/ebus 11/22/13@8 :30am [name]

## 2013-11-22 ENCOUNTER — Ambulatory Visit (HOSPITAL_COMMUNITY): Admission: RE | Admit: 2013-11-22 | Payer: Medicare Other | Source: Ambulatory Visit | Admitting: Pulmonary Disease

## 2013-11-22 ENCOUNTER — Encounter (HOSPITAL_COMMUNITY): Admission: RE | Payer: Self-pay | Source: Ambulatory Visit

## 2013-11-22 SURGERY — VIDEO BRONCHOSCOPY WITH ENDOBRONCHIAL NAVIGATION
Anesthesia: General

## 2014-01-10 ENCOUNTER — Other Ambulatory Visit: Payer: Self-pay | Admitting: Family Medicine

## 2014-02-05 ENCOUNTER — Other Ambulatory Visit: Payer: Self-pay | Admitting: Family Medicine

## 2014-02-05 NOTE — Telephone Encounter (Signed)
Received refill request electronically from pharmacy. Last office visit 03/30/13. Last refill note sent that patient needed to be seen for further refills. No appointment scheduled. Is it okay to refill medication?

## 2014-02-06 ENCOUNTER — Other Ambulatory Visit: Payer: Self-pay | Admitting: Family Medicine

## 2014-02-06 NOTE — Telephone Encounter (Signed)
Schedule a 72min f/u.  Sent.  Thanks.

## 2014-02-06 NOTE — Telephone Encounter (Signed)
Pt notified Rx sent to pharmacy and f/u scheduled for 02/15/14 (30 min)

## 2014-02-15 ENCOUNTER — Encounter: Payer: Self-pay | Admitting: Family Medicine

## 2014-02-15 ENCOUNTER — Ambulatory Visit (INDEPENDENT_AMBULATORY_CARE_PROVIDER_SITE_OTHER): Payer: Medicare Other | Admitting: Family Medicine

## 2014-02-15 VITALS — BP 152/80 | HR 65 | Temp 97.7°F | Wt 189.0 lb

## 2014-02-15 DIAGNOSIS — J449 Chronic obstructive pulmonary disease, unspecified: Secondary | ICD-10-CM

## 2014-02-15 DIAGNOSIS — R918 Other nonspecific abnormal finding of lung field: Secondary | ICD-10-CM

## 2014-02-15 DIAGNOSIS — Z7189 Other specified counseling: Secondary | ICD-10-CM

## 2014-02-15 DIAGNOSIS — R222 Localized swelling, mass and lump, trunk: Secondary | ICD-10-CM

## 2014-02-15 DIAGNOSIS — I6529 Occlusion and stenosis of unspecified carotid artery: Secondary | ICD-10-CM

## 2014-02-15 DIAGNOSIS — I10 Essential (primary) hypertension: Secondary | ICD-10-CM

## 2014-02-15 LAB — BASIC METABOLIC PANEL
BUN: 21 mg/dL (ref 6–23)
CO2: 30 meq/L (ref 19–32)
CREATININE: 1.5 mg/dL (ref 0.4–1.5)
Calcium: 9.7 mg/dL (ref 8.4–10.5)
Chloride: 102 mEq/L (ref 96–112)
GFR: 49.9 mL/min — ABNORMAL LOW (ref >60.00)
GLUCOSE: 109 mg/dL — AB (ref 70–99)
Potassium: 4.8 mEq/L (ref 3.5–5.1)
Sodium: 139 mEq/L (ref 135–145)

## 2014-02-15 MED ORDER — FENOFIBRATE 160 MG PO TABS: 160.0000 mg | ORAL_TABLET | Freq: Every day | ORAL | Status: AC

## 2014-02-15 MED ORDER — METOPROLOL TARTRATE 100 MG PO TABS: 100.0000 mg | ORAL_TABLET | Freq: Every day | ORAL | Status: AC

## 2014-02-15 MED ORDER — METOCLOPRAMIDE HCL 5 MG PO TABS: 5.0000 mg | ORAL_TABLET | Freq: Every day | ORAL | Status: AC | PRN

## 2014-02-15 MED ORDER — CAPTOPRIL 50 MG PO TABS: 50.0000 mg | ORAL_TABLET | Freq: Two times a day (BID) | ORAL | Status: AC

## 2014-02-15 MED ORDER — ALBUTEROL SULFATE HFA 108 (90 BASE) MCG/ACT IN AERS: 2.0000 | INHALATION_SPRAY | RESPIRATORY_TRACT | Status: DC | PRN

## 2014-02-15 MED ORDER — PANTOPRAZOLE SODIUM 40 MG PO TBEC: DELAYED_RELEASE_TABLET | ORAL | Status: AC

## 2014-02-15 MED ORDER — BUDESONIDE-FORMOTEROL FUMARATE 160-4.5 MCG/ACT IN AERO: 2.0000 | INHALATION_SPRAY | Freq: Two times a day (BID) | RESPIRATORY_TRACT | Status: DC

## 2014-02-15 NOTE — Progress Notes (Signed)
Pre visit review using our clinic review tool, if applicable. No additional management support is needed unless otherwise documented below in the visit note.  Likely lung cancer. Some cough, some sputum, nonbloody.  Some days his breathing is better than other.  "I'm okay as long as I go easy."  He can still cut his grass with a push mower (self propelled).   Prev w/u d/w pt.  He declined any further w/u prev and again now.   Advance care planning d/w pt.  Would have his wife designated if he were incapacitated.  Pt told me he would accept short term intensive tx if he had a good chance of recovery to prev/current baseline, ie vent, but wouldn't want prolonged intervention.    Hypertension:    Using medication without problems or lightheadedness: yes Chest pain with exertion:no Edema:no Short of breath: see above  Meds, vitals, and allergies reviewed.   PMH and SH reviewed  ROS: See HPI.  Otherwise negative.    GEN: nad, alert and oriented HEENT: mucous membranes moist NECK: supple w/o LA CV: rrr. PULM: globally dec BS B but ctab, no inc wob, no wheeze ABD: soft, +bs EXT: no edema SKIN: no acute rash

## 2014-02-15 NOTE — Patient Instructions (Signed)
Go to the lab on the way out.  We'll contact you with your lab report. I sent your meds to the pharmacy.  Let me know if I can be of service.  Glad to see you.  Take care.

## 2014-02-18 DIAGNOSIS — Z7189 Other specified counseling: Secondary | ICD-10-CM | POA: Insufficient documentation

## 2014-02-18 NOTE — Assessment & Plan Note (Signed)
See above, likely cancer, declined further w/u.  He is aware of the likely/possible CA dx and prognosis if it is CA.

## 2014-02-18 NOTE — Assessment & Plan Note (Signed)
Wouldn't add spiriva at this point, but we can when needed.  D/w pt.  He agrees.  >25 minutes spent in face to face time with patient, >50% spent in counselling or coordination of care.

## 2014-02-19 ENCOUNTER — Telehealth: Payer: Self-pay | Admitting: Family Medicine

## 2014-02-19 NOTE — Telephone Encounter (Signed)
Relevant patient education assigned to patient using Emmi. ° °

## 2014-04-10 ENCOUNTER — Other Ambulatory Visit: Payer: Self-pay | Admitting: Family Medicine

## 2014-04-19 ENCOUNTER — Ambulatory Visit: Payer: Medicare Other | Admitting: Family

## 2014-04-19 ENCOUNTER — Other Ambulatory Visit (HOSPITAL_COMMUNITY): Payer: Medicare Other

## 2014-04-19 ENCOUNTER — Encounter (HOSPITAL_COMMUNITY): Payer: Medicare Other

## 2014-05-10 ENCOUNTER — Other Ambulatory Visit: Payer: Self-pay | Admitting: Family Medicine

## 2014-05-15 ENCOUNTER — Other Ambulatory Visit: Payer: Self-pay | Admitting: *Deleted

## 2014-05-15 NOTE — Telephone Encounter (Signed)
Spoke to Amy at [facility] and she requested a 90 day supply for patient's Symbicort and Ventolin. Prescriptions put in for a 90 day supply on each.

## 2014-05-16 MED ORDER — ALBUTEROL SULFATE HFA 108 (90 BASE) MCG/ACT IN AERS: 2.0000 | INHALATION_SPRAY | RESPIRATORY_TRACT | Status: AC | PRN

## 2014-05-16 MED ORDER — BUDESONIDE-FORMOTEROL FUMARATE 160-4.5 MCG/ACT IN AERO: 2.0000 | INHALATION_SPRAY | Freq: Two times a day (BID) | RESPIRATORY_TRACT | Status: AC

## 2014-05-16 NOTE — Telephone Encounter (Signed)
Sent. Thanks.   

## 2014-05-24 ENCOUNTER — Encounter: Payer: Self-pay | Admitting: Family

## 2014-05-25 ENCOUNTER — Ambulatory Visit (INDEPENDENT_AMBULATORY_CARE_PROVIDER_SITE_OTHER)
Admission: RE | Admit: 2014-05-25 | Discharge: 2014-05-25 | Disposition: A | Discharge status: Home / Self Care | Payer: Medicare Other | Source: Ambulatory Visit | Attending: Family | Admitting: Family

## 2014-05-25 ENCOUNTER — Encounter: Payer: Self-pay | Admitting: Family

## 2014-05-25 ENCOUNTER — Ambulatory Visit (INDEPENDENT_AMBULATORY_CARE_PROVIDER_SITE_OTHER): Payer: Medicare Other | Admitting: Family

## 2014-05-25 ENCOUNTER — Ambulatory Visit (HOSPITAL_COMMUNITY)
Admission: RE | Admit: 2014-05-25 | Discharge: 2014-05-25 | Disposition: A | Discharge status: Home / Self Care | Payer: Medicare Other | Source: Ambulatory Visit | Attending: Family | Admitting: Family

## 2014-05-25 VITALS — BP 186/82 | HR 68 | Resp 16 | Ht 76.0 in | Wt 192.0 lb

## 2014-05-25 DIAGNOSIS — Z48812 Encounter for surgical aftercare following surgery on the circulatory system: Secondary | ICD-10-CM

## 2014-05-25 DIAGNOSIS — I714 Abdominal aortic aneurysm, without rupture, unspecified: Secondary | ICD-10-CM | POA: Insufficient documentation

## 2014-05-25 DIAGNOSIS — I6529 Occlusion and stenosis of unspecified carotid artery: Secondary | ICD-10-CM

## 2014-05-25 DIAGNOSIS — I739 Peripheral vascular disease, unspecified: Secondary | ICD-10-CM

## 2014-05-25 DIAGNOSIS — I7092 Chronic total occlusion of artery of the extremities: Secondary | ICD-10-CM

## 2014-05-25 DIAGNOSIS — I6523 Occlusion and stenosis of bilateral carotid arteries: Secondary | ICD-10-CM

## 2014-05-25 NOTE — Progress Notes (Signed)
[facility] [place] HISTORY AND PHYSICAL   MRN : [id number]  History of Present Illness:   [name] is a 80 y.o. male patient of Dr. Oneida Alar status post left femoropopliteal bypass graft placed may of 2012. The patient has no history of carotid intervention. He had left 4th toe amputation in 2012.  He also had EVAR stent placement 08/31/2006 for AAA.  Pt. denies claudication symptoms with walking, but states he does not walk fast, but he uses a push mower, mows his lawn, without claudication symptoms, denies non healing wounds Pt. denies rest pain, denies night pain, denies non healing ulcers on lower extremities.  Patient denies recent symptoms of TIA or stroke. He does report a TIA in 2008 after the EVAR as manifested by slurred speech and syncope while in bed, denies amaurosis fugax or monocular blindness, denies facial drooping, denies hemiplegia, denies any further TIA or stroke symptoms.  The patient has not had back, chest, or abdominal pain.  His blood pressure is elevated today, but he was NPO for the AAA Duplex and did not take his antihypertensive medication, states his blood pressure was good a a recent visit to one of his medical providers.  Pt Diabetic: No  Pt smoker: smoker (decreased use to takes him a couple of weeks to go through a pack, started smoking over 50 yrs ago)  Pt meds include: Statin :No Betablocker: Yes ASA: No, GI intolerance Other anticoagulants/antiplatelets: no  Current Outpatient Prescriptions  Medication Sig Dispense Refill  . acetaminophen (TYLENOL) 325 MG tablet Take 650 mg by mouth every 6 (six) hours as needed.      Marland Kitchen albuterol (PROVENTIL HFA;VENTOLIN HFA) 108 (90 BASE) MCG/ACT inhaler Inhale 2 puffs into the lungs every 4 (four) hours as needed for wheezing.  54 g  3  . budesonide-formoterol (SYMBICORT) 160-4.5 MCG/ACT inhaler Inhale 2 puffs into the lungs 2 (two) times daily.  3 Inhaler  3  . captopril (CAPOTEN) 50 MG  tablet Take 1 tablet (50 mg total) by mouth 2 (two) times daily.  180 tablet  3  . Cholecalciferol (VITAMIN D) 1000 UNITS capsule Take 5,000 Units by mouth daily.       . Coral Calcium 1000 (390 CA) MG TABS Take 1 tablet by mouth daily.      . fenofibrate 160 MG tablet Take 1 tablet (160 mg total) by mouth daily.  90 tablet  3  . metoCLOPramide (REGLAN) 5 MG tablet Take 1 tablet (5 mg total) by mouth daily as needed.  90 tablet  3  . metoprolol (LOPRESSOR) 100 MG tablet Take 1 tablet (100 mg total) by mouth daily.  90 tablet  3  . pantoprazole (PROTONIX) 40 MG tablet TAKE 1 TABLET BY MOUTH DAILY  90 tablet  3  . vitamin E 400 UNIT capsule Take 400 Units by mouth daily.       No current facility-administered medications for this visit.    Past Medical History  Diagnosis Date  . Hyperlipidemia   . Allergy   . COPD (chronic obstructive pulmonary disease)     via X-ray  . BPH (benign prostatic hyperplasia)     Hx of (Dr. Rosana Hoes)  . Gallstones     via CT  . Cigarette smoker   . CVA (cerebral infarction)   . Degeneration of intervertebral disc, site unspecified   . Stricture and stenosis of esophagus   . Unspecified gastritis and gastroduodenitis without mention of hemorrhage   .  Duodenitis without mention of hemorrhage   . Benign neoplasm of colon   . Diverticulosis of colon (without mention of hemorrhage)   . Abdominal aneurysm without mention of rupture     followed by [facility] (Dr. Oneida Alar)  . Gout, unspecified   . Personal history of urinary calculi   . Hyperglycemia   . Low back pain   . Nausea   . Carotid arterial disease 08/2010    on [place] 60-80% L ICA and <40% R ICA  . PAD (peripheral artery disease)   . Hypertension     Social History History  Substance Use Topics  . Smoking status: Former Smoker -- 1.00 packs/day for 50 years    Types: Cigarettes    Quit date: 08/26/2013  . Smokeless tobacco: Never Used  . Alcohol Use: No    Family History Family History  Problem  Relation Age of Onset  . Heart disease Mother     MI  . Aneurysm Father   . Hypertension Neg Hx   . Diabetes Neg Hx   . Arthritis Neg Hx   . Depression Neg Hx   . Alcohol abuse Neg Hx   . Drug abuse Neg Hx   . Colon cancer Neg Hx   . Prostate cancer Neg Hx   . Cancer Brother     Lung  . Heart disease Brother     MI    Surgical History Past Surgical History  Procedure Laterality Date  . Inguinal hernia repair  03/21/2002    Bilateral  . Esophagogastroduodenoscopy  12/15/2004    Duodenitis, gastritis, esoph stricture, path neg  . Ct of abdomen  10/09/2004    5.1 cm AAA gallstone; 7 cm. stone in the pole of the right kidney  . Ct of pelvis  10/09/2004    Normal except for divertic of bladder  . [place] of aaa  07/06/2005    Unchanged 4.9 cm. (Dr. Amedeo Plenty)  . [place] of aaa  12/24/2005    5.2 cm  . [place] of abdomen  06/24/2006    5.5 cm  . Adenosine myoview  07/13/2007    Normal  . Abdominal aortogram  07/29/2006    Infrafenal abdominal aortic aneurysm with total occulision of inferior mesenteric artery  . Abdomen ct of angio  07/22/2006    Right kidney stone, L4/5/S1 DDD; AAA 5.3 cm. gallstones  . Aaa stent graft  08/31/2006    Right artery repair -- S/P Post op CVA  . Ct of head  09/01/2006    Small vessel changes  . Pre-surgery carotid US      Mod right ICA; mild left ICA stenosis  . Mri brain  09/01/2006    Normal  . Abdominal US  11/18/2006    Normal  . Toe amputation      L 4th 2012  . Femoral-popliteal bypass graft      left, 2012  . Cataract extraction      2012, bilateral    Allergies  Allergen Reactions  . Aspirin     REACTION: GI intolerance  . Sulfonamide Derivatives     REACTION: nausea    Current Outpatient Prescriptions  Medication Sig Dispense Refill  . acetaminophen (TYLENOL) 325 MG tablet Take 650 mg by mouth every 6 (six) hours as needed.      Marland Kitchen albuterol (PROVENTIL HFA;VENTOLIN HFA) 108 (90 BASE) MCG/ACT inhaler Inhale 2 puffs into the lungs every 4 (four)  hours as needed for wheezing.  54 g  3  .  budesonide-formoterol (SYMBICORT) 160-4.5 MCG/ACT inhaler Inhale 2 puffs into the lungs 2 (two) times daily.  3 Inhaler  3  . captopril (CAPOTEN) 50 MG tablet Take 1 tablet (50 mg total) by mouth 2 (two) times daily.  180 tablet  3  . Cholecalciferol (VITAMIN D) 1000 UNITS capsule Take 5,000 Units by mouth daily.       . Coral Calcium 1000 (390 CA) MG TABS Take 1 tablet by mouth daily.      . fenofibrate 160 MG tablet Take 1 tablet (160 mg total) by mouth daily.  90 tablet  3  . metoCLOPramide (REGLAN) 5 MG tablet Take 1 tablet (5 mg total) by mouth daily as needed.  90 tablet  3  . metoprolol (LOPRESSOR) 100 MG tablet Take 1 tablet (100 mg total) by mouth daily.  90 tablet  3  . pantoprazole (PROTONIX) 40 MG tablet TAKE 1 TABLET BY MOUTH DAILY  90 tablet  3  . vitamin E 400 UNIT capsule Take 400 Units by mouth daily.       No current facility-administered medications for this visit.     REVIEW OF SYSTEMS: See HPI for pertinent positives and negatives.  Physical Examination Filed Vitals:   05/25/14 1039  BP: 186/82  Pulse: 68  Resp: 16    General: WDWN in NAD  Gait: Normal  HENT: WNL  Eyes: Pupils equal  Pulmonary: normal non-labored breathing , without Rales, rhonchi, positive for insp. and expir. wheezes in all fields A&P.  Cardiac: RRR, positive for murmer  No carotid bruits  Abdomen: soft, NT, no masses  Skin: no rashes, ulcers noted; no Gangrene , no cellulitis; no open wounds; positive for multiple small varicoities in ankles.   VASCULAR EXAM  Carotid Bruits  Left  Right    Negative  Negative    Aorta is   palpable Radial pulses     VASCULAR EXAM:  Extremities without ischemic changes, left 4th toe surgically absent.  without Gangrene of without open wounds, positive for 1+ bilateral pretibial pitting edema in left lower leg.   LE Pulses  LEFT  RIGHT   FEMORAL  palpable  palpable   POPLITEAL  palpable  Not palpable    POSTERIOR TIBIAL  not palpable  not palpable   DORSALIS PEDIS  ANTERIOR TIBIAL  not palpable  not palpable    Musculoskeletal: no muscle wasting or atrophy; no edema,  left 4th toe surgically absent.  Neurologic: A&O X 3; Appropriate Affect ; SENSATION: normal; MOTOR FUNCTION: 5/5 Symmetric, CN 2-12 intact, Speech is fluent/normal.    Non-Invasive Vascular Imaging (05/25/2014):   CEREBROVASCULAR DUPLEX EVALUATION    INDICATION: Carotid artery disease    PREVIOUS INTERVENTION(S): None    DUPLEX EXAM: Carotid duplex    RIGHT  LEFT  Peak Systolic Velocities (cm/s) End Diastolic Velocities (cm/s) Plaque LOCATION Peak Systolic Velocities (cm/s) End Diastolic Velocities (cm/s) Plaque  62 9 - CCA PROXIMAL 52 9   60 10 - CCA MID 57 12   63 9 HT CCA DISTAL 60 11   229 25 HT ECA 251 31   137 28 HT ICA PROXIMAL 422 110   115 23 - ICA MID 74 14   77 21 - ICA DISTAL 67 16     3.81 ICA / CCA Ratio (PSV) 7.4  Antegrade Vertebral Flow Antegrade  361 Brachial Systolic Pressure (mmHg) 443  Triphasic Brachial Artery Waveforms Triphasic    Plaque Morphology:  HM = Homogeneous, HT = Heterogeneous,  CP = Calcific Plaque, SP = Smooth Plaque, IP = Irregular Plaque     ADDITIONAL FINDINGS:     IMPRESSION: 1. Less than 40% right internal carotid artery stenosis 2. 60 - 79% left internal carotid artery stenosis.    Compared to the previous exam:  Increased velocity on the left.     ABDOMINAL AORTA DUPLEX EVALUATION - POST ENDOVASCULAR REPAIR    INDICATION: Follow up aortic aneurysm repair    PREVIOUS INTERVENTION(S): Aortic aneurysm endograft in 2008    DUPLEX EXAM:      DIAMETER AP (cm) DIAMETER TRANSVERSE (cm) VELOCITIES (cm/sec)  Aorta 3.4 3.1 47  Right Common Iliac 1.0 - 63  Left Common Iliac 1.3 .97 81    Comparison Study       Date DIAMETER AP (cm) DIAMETER TRANSVERSE (cm)  04/21/2013 3.3 3.2     ADDITIONAL FINDINGS:     IMPRESSION: 3.4 x 3.1  cm aortic sac size.     Compared to the previous exam:  No change   ABI's: Right: 0.72, with monophasic waveforms, TBI: 0.55; Left: 1.07, with biphasic waveforms, TBI: 0.66. Previous (04/21/13) ABI's: Right: 1.0, Left: 1.0  ASSESSMENT:  [name] is a 80 y.o. male  status post left femoropopliteal bypass graft placed may of 2012. The patient has no history of carotid intervention. He had left 4th toe amputation in 2012.  He also had EVAR stent placement 08/31/2006 for AAA.  Decreased ABI in right LE from normal to evidence of moderate arterial occlusive disease, left LE remains in the normal range. He has not been walking very briskly to avoid claudication symptoms in his right calf, see Plan. EVAR Duplex today demonstrates 3.4 x 3.1  cm aortic sac size, no change from previous Duplex.  He has had no stroke or TIA activity since 2008 in hospital aft the EVAR. Today's carotid Duplex demonstrates Less than 40% right internal carotid artery stenosis and 60 - 79% left internal carotid artery stenosis. Increased velocity in the left ICA compared to the previous Duplex on 04/21/13.  PLAN:    Graduated walking program. He was again counseled re smoking cessation. Based on today's exam and non-invasive vascular lab results, the patient will follow up in 6 months with the following tests : carotid Duplex, and 1 year for ABI's and EVAR Duplex. I discussed in depth with the patient the nature of atherosclerosis, and emphasized the importance of maximal medical management including strict control of blood pressure, blood glucose, and lipid levels, obtaining regular exercise, and cessation of smoking.  The patient is aware that without maximal medical management the underlying atherosclerotic disease process will progress, limiting the benefit of any interventions. Consideration for repair of AAA would be made when the size is 5.5 cm, growth > 1 cm/yr, and symptomatic status. The patient was given information about stroke  prevention and what symptoms should prompt the patient to seek immediate medical care. The patient was given information about AAA including signs, symptoms, treatment,  what symptoms should prompt the patient to seek immediate medical care, and how to minimize the risk of enlargement and rupture of aneurysms. The patient was given information about PAD including signs, symptoms, treatment, what symptoms should prompt the patient to seek immediate medical care, and risk reduction measures to take. Thank you for allowing [place] to participate in this patient's care.  Clemon Chambers, RN, MSN, FNP-C [facility] Office: [phone]  Clinic MD: Bridgett Larsson 05/25/2014 10:32 AM

## 2014-05-25 NOTE — Patient Instructions (Signed)
Stroke Prevention Some medical conditions and behaviors are associated with an increased chance of having a stroke. You may prevent a stroke by making healthy choices and managing medical conditions. HOW CAN I REDUCE MY RISK OF HAVING A STROKE?   Stay physically active. Get at least 30 minutes of activity on most or all days.  Do not smoke. It may also be helpful to avoid exposure to secondhand smoke.  Limit alcohol use. Moderate alcohol use is considered to be:  No more than 2 drinks per day for men.  No more than 1 drink per day for nonpregnant women.  Eat healthy foods. This involves:  Eating 5 or more servings of fruits and vegetables a day.  Making dietary changes that address high blood pressure (hypertension), high cholesterol, diabetes, or obesity.  Manage your cholesterol levels.  Making food choices that are high in fiber and low in saturated fat, trans fat, and cholesterol may control cholesterol levels.  Take any prescribed medicines to control cholesterol as directed by your health care provider.  Manage your diabetes.  Controlling your carbohydrate and sugar intake is recommended to manage diabetes.  Take any prescribed medicines to control diabetes as directed by your health care provider.  Control your hypertension.  Making food choices that are low in salt (sodium), saturated fat, trans fat, and cholesterol is recommended to manage hypertension.  Take any prescribed medicines to control hypertension as directed by your health care provider.  Maintain a healthy weight.  Reducing calorie intake and making food choices that are low in sodium, saturated fat, trans fat, and cholesterol are recommended to manage weight.  Stop drug abuse.  Avoid taking birth control pills.  Talk to your health care provider about the risks of taking birth control pills if you are over 35 years old, smoke, get migraines, or have ever had a blood clot.  Get evaluated for sleep  disorders (sleep apnea).  Talk to your health care provider about getting a sleep evaluation if you snore a lot or have excessive sleepiness.  Take medicines only as directed by your health care provider.  For some people, aspirin or blood thinners (anticoagulants) are helpful in reducing the risk of forming abnormal blood clots that can lead to stroke. If you have the irregular heart rhythm of atrial fibrillation, you should be on a blood thinner unless there is a good reason you cannot take them.  Understand all your medicine instructions.  Make sure that other conditions (such as anemia or atherosclerosis) are addressed. SEEK IMMEDIATE MEDICAL CARE IF:   You have sudden weakness or numbness of the face, arm, or leg, especially on one side of the body.  Your face or eyelid droops to one side.  You have sudden confusion.  You have trouble speaking (aphasia) or understanding.  You have sudden trouble seeing in one or both eyes.  You have sudden trouble walking.  You have dizziness.  You have a loss of balance or coordination.  You have a sudden, severe headache with no known cause.  You have new chest pain or an irregular heartbeat. Any of these symptoms may represent a serious problem that is an emergency. Do not wait to see if the symptoms will go away. Get medical help at once. Call your local emergency services (911 in [place] Do not drive yourself to the hospital. Document Released: 09/10/2004 Document Revised: 12/18/2013 Document Reviewed: 02/03/2013 [employer] Patient Information 2015 [employer]. This information is not intended to replace advice given   to you by your health care provider. Make sure you discuss any questions you have with your health care provider.   Abdominal Aortic Aneurysm An aneurysm is a weakened or damaged part of an artery wall that bulges from the normal force of blood pumping through the body. An abdominal aortic aneurysm is an aneurysm that  occurs in the lower part of the aorta, the main artery of the body.  The major concern with an abdominal aortic aneurysm is that it can enlarge and burst (rupture) or blood can flow between the layers of the wall of the aorta through a tear (aorticdissection). Both of these conditions can cause bleeding inside the body and can be life threatening unless diagnosed and treated promptly. CAUSES  The exact cause of an abdominal aortic aneurysm is unknown. Some contributing factors are:   A hardening of the arteries caused by the buildup of fat and other substances in the lining of a blood vessel (arteriosclerosis).  Inflammation of the walls of an artery (arteritis).   Connective tissue diseases, such as Marfan syndrome.   Abdominal trauma.   An infection, such as syphilis or staphylococcus, in the wall of the aorta (infectious aortitis) caused by bacteria. RISK FACTORS  Risk factors that contribute to an abdominal aortic aneurysm may include:  Age older than 10 years.   High blood pressure (hypertension).  Male gender.  Ethnicity (white race).  Obesity.  Family history of aneurysm (first degree relatives only).  Tobacco use. PREVENTION  The following healthy lifestyle habits may help decrease your risk of abdominal aortic aneurysm:  Quitting smoking. Smoking can raise your blood pressure and cause arteriosclerosis.  Limiting or avoiding alcohol.  Keeping your blood pressure, blood sugar level, and cholesterol levels within normal limits.  Decreasing your salt intake. In somepeople, too much salt can raise blood pressure and increase your risk of abdominal aortic aneurysm.  Eating a diet low in saturated fats and cholesterol.  Increasing your fiber intake by including whole grains, vegetables, and fruits in your diet. Eating these foods may help lower blood pressure.  Maintaining a healthy weight.  Staying physically active and exercising regularly. SYMPTOMS  The  symptoms of abdominal aortic aneurysm may vary depending on the size and rate of growth of the aneurysm.Most grow slowly and do not have any symptoms. When symptoms do occur, they may include:  Pain (abdomen, side, lower back, or groin). The pain may vary in intensity. A sudden onset of severe pain may indicate that the aneurysm has ruptured.  Feeling full after eating only small amounts of food.  Nausea or vomiting or both.  Feeling a pulsating lump in the abdomen.  Feeling faint or passing out. DIAGNOSIS  Since most unruptured abdominal aortic aneurysms have no symptoms, they are often discovered during diagnostic exams for other conditions. An aneurysm may be found during the following procedures:  Ultrasonography (A one-time screening for abdominal aortic aneurysm by ultrasonography is also recommended for all men aged 91-75 years who have ever smoked).  X-ray exams.  A computed tomography (CT).  Magnetic resonance imaging (MRI).  Angiography or arteriography. TREATMENT  Treatment of an abdominal aortic aneurysm depends on the size of your aneurysm, your age, and risk factors for rupture. Medication to control blood pressure and pain may be used to manage aneurysms smaller than 6 cm. Regular monitoring for enlargement may be recommended by your caregiver if:  The aneurysm is 3-4 cm in size (an annual ultrasonography may be recommended).  The  aneurysm is 4-4.5 cm in size (an ultrasonography every 6 months may be recommended).  The aneurysm is larger than 4.5 cm in size (your caregiver may ask that you be examined by a vascular surgeon). If your aneurysm is larger than 6 cm, surgical repair may be recommended. There are two main methods for repair of an aneurysm:   Endovascular repair (a minimally invasive surgery). This is done most often.  Open repair. This method is used if an endovascular repair is not possible. Document Released: 05/13/2005 Document Revised: 11/28/2012  Document Reviewed: 09/02/2012 [facility] Patient Information 2015 [place], [place]. This information is not intended to replace advice given to you by your health care provider. Make sure you discuss any questions you have with your health care provider.   Peripheral Vascular Disease Peripheral Vascular Disease (PVD), also called Peripheral Arterial Disease (PAD), is a circulation problem caused by cholesterol (atherosclerotic plaque) deposits in the arteries. PVD commonly occurs in the lower extremities (legs) but it can occur in other areas of the body, such as your arms. The cholesterol buildup in the arteries reduces blood flow which can cause pain and other serious problems. The presence of PVD can place a person at risk for Coronary Artery Disease (CAD).  CAUSES  Causes of PVD can be many. It is usually associated with more than one risk factor such as:   High Cholesterol.  Smoking.  Diabetes.  Lack of exercise or inactivity.  High blood pressure (hypertension).  Obesity.  Family history. SYMPTOMS   When the lower extremities are affected, patients with PVD may experience:  Leg pain with exertion or physical activity. This is called INTERMITTENT CLAUDICATION. This may present as cramping or numbness with physical activity. The location of the pain is associated with the level of blockage. For example, blockage at the abdominal level (distal abdominal aorta) may result in buttock or hip pain. Lower leg arterial blockage may result in calf pain.  As PVD becomes more severe, pain can develop with less physical activity.  In people with severe PVD, leg pain may occur at rest.  Other PVD signs and symptoms:  Leg numbness or weakness.  Coldness in the affected leg or foot, especially when compared to the other leg.  A change in leg color.  Patients with significant PVD are more prone to ulcers or sores on toes, feet or legs. These may take longer to heal or may reoccur. The  ulcers or sores can become infected.  If signs and symptoms of PVD are ignored, gangrene may occur. This can result in the loss of toes or loss of an entire limb.  Not all leg pain is related to PVD. Other medical conditions can cause leg pain such as:  Blood clots (embolism) or Deep Vein Thrombosis.  Inflammation of the blood vessels (vasculitis).  Spinal stenosis. DIAGNOSIS  Diagnosis of PVD can involve several different types of tests. These can include:  Pulse Volume Recording Method (PVR). This test is simple, painless and does not involve the use of X-rays. PVR involves measuring and comparing the blood pressure in the arms and legs. An ABI (Ankle-Brachial Index) is calculated. The normal ratio of blood pressures is 1. As this number becomes smaller, it indicates more severe disease.  < 0.95 - indicates significant narrowing in one or more leg vessels.  <0.8 - there will usually be pain in the foot, leg or buttock with exercise.  <0.4 - will usually have pain in the legs at rest.  <0.25 -  usually indicates limb threatening PVD.  Doppler detection of pulses in the legs. This test is painless and checks to see if you have a pulses in your legs/feet.  A dye or contrast material (a substance that highlights the blood vessels so they show up on x-ray) may be given to help your caregiver better see the arteries for the following tests. The dye is eliminated from your body by the kidney's. Your caregiver may order blood work to check your kidney function and other laboratory values before the following tests are performed:  Magnetic Resonance Angiography (MRA). An MRA is a picture study of the blood vessels and arteries. The MRA machine uses a large magnet to produce images of the blood vessels.  Computed Tomography Angiography (CTA). A CTA is a specialized x-ray that looks at how the blood flows in your blood vessels. An IV may be inserted into your arm so contrast dye can be  injected.  Angiogram. Is a procedure that uses x-rays to look at your blood vessels. This procedure is minimally invasive, meaning a small incision (cut) is made in your groin. A small tube (catheter) is then inserted into the artery of your groin. The catheter is guided to the blood vessel or artery your caregiver wants to examine. Contrast dye is injected into the catheter. X-rays are then taken of the blood vessel or artery. After the images are obtained, the catheter is taken out. TREATMENT  Treatment of PVD involves many interventions which may include:  Lifestyle changes:  Quitting smoking.  Exercise.  Following a low fat, low cholesterol diet.  Control of diabetes.  Foot care is very important to the PVD patient. Good foot care can help prevent infection.  Medication:  Cholesterol-lowering medicine.  Blood pressure medicine.  Anti-platelet drugs.  Certain medicines may reduce symptoms of Intermittent Claudication.  Interventional/Surgical options:  Angioplasty. An Angioplasty is a procedure that inflates a balloon in the blocked artery. This opens the blocked artery to improve blood flow.  Stent Implant. A wire mesh tube (stent) is placed in the artery. The stent expands and stays in place, allowing the artery to remain open.  Peripheral Bypass Surgery. This is a surgical procedure that reroutes the blood around a blocked artery to help improve blood flow. This type of procedure may be performed if Angioplasty or stent implants are not an option. SEEK IMMEDIATE MEDICAL CARE IF:   You develop pain or numbness in your arms or legs.  Your arm or leg turns cold, becomes blue in color.  You develop redness, warmth, swelling and pain in your arms or legs. MAKE SURE YOU:   Understand these instructions.  Will watch your condition.  Will get help right away if you are not doing well or get worse. Document Released: 09/10/2004 Document Revised: 10/26/2011 Document  Reviewed: 08/07/2008 [facility] Patient Information 2015 [place], [place]. This information is not intended to replace advice given to you by your health care provider. Make sure you discuss any questions you have with your health care provider.   Smoking Cessation Quitting smoking is important to your health and has many advantages. However, it is not always easy to quit since nicotine is a very addictive drug. Oftentimes, people try 3 times or more before being able to quit. This document explains the best ways for you to prepare to quit smoking. Quitting takes hard work and a lot of effort, but you can do it. ADVANTAGES OF QUITTING SMOKING  You will live longer, feel better,  and live better.  Your body will feel the impact of quitting smoking almost immediately.  Within 20 minutes, blood pressure decreases. Your pulse returns to its normal level.  After 8 hours, carbon monoxide levels in the blood return to normal. Your oxygen level increases.  After 24 hours, the chance of having a heart attack starts to decrease. Your breath, hair, and body stop smelling like smoke.  After 48 hours, damaged nerve endings begin to recover. Your sense of taste and smell improve.  After 72 hours, the body is virtually free of nicotine. Your bronchial tubes relax and breathing becomes easier.  After 2 to 12 weeks, lungs can hold more air. Exercise becomes easier and circulation improves.  The risk of having a heart attack, stroke, cancer, or lung disease is greatly reduced.  After 1 year, the risk of coronary heart disease is cut in half.  After 5 years, the risk of stroke falls to the same as a nonsmoker.  After 10 years, the risk of lung cancer is cut in half and the risk of other cancers decreases significantly.  After 15 years, the risk of coronary heart disease drops, usually to the level of a nonsmoker.  If you are pregnant, quitting smoking will improve your chances of having a healthy  baby.  The people you live with, especially any children, will be healthier.  You will have extra money to spend on things other than cigarettes. QUESTIONS TO THINK ABOUT BEFORE ATTEMPTING TO QUIT You may want to talk about your answers with your health care provider.  Why do you want to quit?  If you tried to quit in the past, what helped and what did not?  What will be the most difficult situations for you after you quit? How will you plan to handle them?  Who can help you through the tough times? Your family? Friends? A health care provider?  What pleasures do you get from smoking? What ways can you still get pleasure if you quit? Here are some questions to ask your health care provider:  How can you help me to be successful at quitting?  What medicine do you think would be best for me and how should I take it?  What should I do if I need more help?  What is smoking withdrawal like? How can I get information on withdrawal? GET READY  Set a quit date.  Change your environment by getting rid of all cigarettes, ashtrays, matches, and lighters in your home, car, or work. Do not let people smoke in your home.  Review your past attempts to quit. Think about what worked and what did not. GET SUPPORT AND ENCOURAGEMENT You have a better chance of being successful if you have help. You can get support in many ways.  Tell your family, friends, and coworkers that you are going to quit and need their support. Ask them not to smoke around you.  Get individual, group, or telephone counseling and support. Programs are available at [employer] and health centers. Call your local health department for information about programs in your area.  Spiritual beliefs and practices may help some smokers quit.  Download a "quit meter" on your computer to keep track of quit statistics, such as how long you have gone without smoking, cigarettes not smoked, and money saved.  Get a self-help book  about quitting smoking and staying off tobacco. [place] yourself from urges to smoke. Talk to someone,  go for a walk, or occupy your time with a task.  Change your normal routine. Take a different route to work. Drink tea instead of coffee. Eat breakfast in a different place.  Reduce your stress. Take a hot bath, exercise, or read a book.  Plan something enjoyable to do every day. Reward yourself for not smoking.  Explore interactive web-based programs that specialize in helping you quit. GET MEDICINE AND USE IT CORRECTLY Medicines can help you stop smoking and decrease the urge to smoke. Combining medicine with the above behavioral methods and support can greatly increase your chances of successfully quitting smoking.  Nicotine replacement therapy helps deliver nicotine to your body without the negative effects and risks of smoking. Nicotine replacement therapy includes nicotine gum, lozenges, inhalers, nasal sprays, and skin patches. Some may be available over-the-counter and others require a prescription.  Antidepressant medicine helps people abstain from smoking, but how this works is unknown. This medicine is available by prescription.  Nicotinic receptor partial agonist medicine simulates the effect of nicotine in your brain. This medicine is available by prescription. Ask your health care provider for advice about which medicines to use and how to use them based on your health history. Your health care provider will tell you what side effects to look out for if you choose to be on a medicine or therapy. Carefully read the information on the package. Do not use any other product containing nicotine while using a nicotine replacement product.  RELAPSE OR DIFFICULT SITUATIONS Most relapses occur within the first 3 months after quitting. Do not be discouraged if you start smoking again. Remember, most people try several times before finally quitting. You may  have symptoms of withdrawal because your body is used to nicotine. You may crave cigarettes, be irritable, feel very hungry, cough often, get headaches, or have difficulty concentrating. The withdrawal symptoms are only temporary. They are strongest when you first quit, but they will go away within 10-14 days. To reduce the chances of relapse, try to:  Avoid drinking alcohol. Drinking lowers your chances of successfully quitting.  Reduce the amount of caffeine you consume. Once you quit smoking, the amount of caffeine in your body increases and can give you symptoms, such as a rapid heartbeat, sweating, and anxiety.  Avoid smokers because they can make you want to smoke.  Do not let weight gain distract you. Many smokers will gain weight when they quit, usually less than 10 pounds. Eat a healthy diet and stay active. You can always lose the weight gained after you quit.  Find ways to improve your mood other than smoking. FOR MORE INFORMATION  www.smokefree.gov  Document Released: 07/28/2001 Document Revised: 12/18/2013 Document Reviewed: 11/12/2011 [facility] Patient Information 2015 [place], [place]. This information is not intended to replace advice given to you by your health care provider. Make sure you discuss any questions you have with your health care provider.

## 2014-05-28 ENCOUNTER — Other Ambulatory Visit: Payer: Self-pay

## 2014-05-28 DIAGNOSIS — I6523 Occlusion and stenosis of bilateral carotid arteries: Secondary | ICD-10-CM

## 2014-05-28 DIAGNOSIS — I739 Peripheral vascular disease, unspecified: Secondary | ICD-10-CM

## 2014-05-28 DIAGNOSIS — Z48812 Encounter for surgical aftercare following surgery on the circulatory system: Secondary | ICD-10-CM

## 2014-05-28 DIAGNOSIS — I714 Abdominal aortic aneurysm, without rupture, unspecified: Secondary | ICD-10-CM

## 2014-06-01 ENCOUNTER — Other Ambulatory Visit: Payer: Self-pay

## 2014-07-23 ENCOUNTER — Encounter (HOSPITAL_COMMUNITY): Payer: Self-pay | Admitting: Emergency Medicine

## 2014-07-23 ENCOUNTER — Ambulatory Visit (INDEPENDENT_AMBULATORY_CARE_PROVIDER_SITE_OTHER): Payer: Medicare Other | Admitting: Family Medicine

## 2014-07-23 ENCOUNTER — Emergency Department (HOSPITAL_COMMUNITY): Payer: Medicare Other

## 2014-07-23 ENCOUNTER — Encounter: Payer: Self-pay | Admitting: Family Medicine

## 2014-07-23 ENCOUNTER — Inpatient Hospital Stay (HOSPITAL_COMMUNITY)
Admission: EM | Admit: 2014-07-23 | Discharge: 2014-07-27 | DRG: 291 | Disposition: A | Discharge status: Home / Self Care | Payer: Medicare Other | Attending: Internal Medicine | Admitting: Internal Medicine

## 2014-07-23 VITALS — BP 148/72 | HR 77 | Temp 98.6°F | Wt 195.5 lb

## 2014-07-23 DIAGNOSIS — I509 Heart failure, unspecified: Secondary | ICD-10-CM

## 2014-07-23 DIAGNOSIS — F17210 Nicotine dependence, cigarettes, uncomplicated: Secondary | ICD-10-CM | POA: Diagnosis present

## 2014-07-23 DIAGNOSIS — J449 Chronic obstructive pulmonary disease, unspecified: Secondary | ICD-10-CM | POA: Diagnosis present

## 2014-07-23 DIAGNOSIS — J9601 Acute respiratory failure with hypoxia: Secondary | ICD-10-CM | POA: Diagnosis present

## 2014-07-23 DIAGNOSIS — I129 Hypertensive chronic kidney disease with stage 1 through stage 4 chronic kidney disease, or unspecified chronic kidney disease: Secondary | ICD-10-CM | POA: Diagnosis present

## 2014-07-23 DIAGNOSIS — Z882 Allergy status to sulfonamides status: Secondary | ICD-10-CM | POA: Diagnosis not present

## 2014-07-23 DIAGNOSIS — C3490 Malignant neoplasm of unspecified part of unspecified bronchus or lung: Secondary | ICD-10-CM | POA: Diagnosis present

## 2014-07-23 DIAGNOSIS — E785 Hyperlipidemia, unspecified: Secondary | ICD-10-CM | POA: Diagnosis present

## 2014-07-23 DIAGNOSIS — Z886 Allergy status to analgesic agent status: Secondary | ICD-10-CM | POA: Diagnosis not present

## 2014-07-23 DIAGNOSIS — D649 Anemia, unspecified: Secondary | ICD-10-CM | POA: Diagnosis present

## 2014-07-23 DIAGNOSIS — Z87442 Personal history of urinary calculi: Secondary | ICD-10-CM

## 2014-07-23 DIAGNOSIS — R0602 Shortness of breath: Secondary | ICD-10-CM | POA: Diagnosis present

## 2014-07-23 DIAGNOSIS — I5033 Acute on chronic diastolic (congestive) heart failure: Secondary | ICD-10-CM | POA: Diagnosis not present

## 2014-07-23 DIAGNOSIS — J189 Pneumonia, unspecified organism: Secondary | ICD-10-CM | POA: Diagnosis present

## 2014-07-23 DIAGNOSIS — N183 Chronic kidney disease, stage 3 (moderate): Secondary | ICD-10-CM | POA: Diagnosis present

## 2014-07-23 DIAGNOSIS — I420 Dilated cardiomyopathy: Secondary | ICD-10-CM | POA: Diagnosis present

## 2014-07-23 DIAGNOSIS — Z9841 Cataract extraction status, right eye: Secondary | ICD-10-CM

## 2014-07-23 DIAGNOSIS — Z8673 Personal history of transient ischemic attack (TIA), and cerebral infarction without residual deficits: Secondary | ICD-10-CM | POA: Diagnosis not present

## 2014-07-23 DIAGNOSIS — I5043 Acute on chronic combined systolic (congestive) and diastolic (congestive) heart failure: Secondary | ICD-10-CM | POA: Diagnosis present

## 2014-07-23 DIAGNOSIS — R0902 Hypoxemia: Secondary | ICD-10-CM | POA: Insufficient documentation

## 2014-07-23 DIAGNOSIS — N400 Enlarged prostate without lower urinary tract symptoms: Secondary | ICD-10-CM | POA: Diagnosis present

## 2014-07-23 DIAGNOSIS — Z9981 Dependence on supplemental oxygen: Secondary | ICD-10-CM | POA: Diagnosis not present

## 2014-07-23 DIAGNOSIS — M109 Gout, unspecified: Secondary | ICD-10-CM | POA: Diagnosis present

## 2014-07-23 DIAGNOSIS — Z9842 Cataract extraction status, left eye: Secondary | ICD-10-CM | POA: Diagnosis not present

## 2014-07-23 DIAGNOSIS — I10 Essential (primary) hypertension: Secondary | ICD-10-CM | POA: Diagnosis present

## 2014-07-23 DIAGNOSIS — I739 Peripheral vascular disease, unspecified: Secondary | ICD-10-CM | POA: Diagnosis present

## 2014-07-23 DIAGNOSIS — R109 Unspecified abdominal pain: Secondary | ICD-10-CM

## 2014-07-23 DIAGNOSIS — I6529 Occlusion and stenosis of unspecified carotid artery: Secondary | ICD-10-CM

## 2014-07-23 LAB — COMPREHENSIVE METABOLIC PANEL
ALK PHOS: 43 U/L (ref 39–117)
ALT: 152 U/L — ABNORMAL HIGH (ref 0–53)
AST: 129 U/L — ABNORMAL HIGH (ref 0–37)
Albumin: 3 g/dL — ABNORMAL LOW (ref 3.5–5.2)
Anion gap: 12 (ref 5–15)
BILIRUBIN TOTAL: 0.5 mg/dL (ref 0.3–1.2)
BUN: 33 mg/dL — ABNORMAL HIGH (ref 6–23)
CHLORIDE: 105 meq/L (ref 96–112)
CO2: 28 mEq/L (ref 19–32)
Calcium: 9.4 mg/dL (ref 8.4–10.5)
Creatinine, Ser: 1.48 mg/dL — ABNORMAL HIGH (ref 0.50–1.35)
GFR calc Af Amer: 50 mL/min — ABNORMAL LOW (ref >90)
GFR calc non Af Amer: 43 mL/min — ABNORMAL LOW (ref >90)
Glucose, Bld: 94 mg/dL (ref 70–99)
POTASSIUM: 4.5 meq/L (ref 3.7–5.3)
SODIUM: 145 meq/L (ref 137–147)
Total Protein: 7.5 g/dL (ref 6.0–8.3)

## 2014-07-23 LAB — CBC
HCT: 41.1 % (ref 39.0–52.0)
Hemoglobin: 12.1 g/dL — ABNORMAL LOW (ref 13.0–17.0)
MCH: 28.5 pg (ref 26.0–34.0)
MCHC: 29.4 g/dL — ABNORMAL LOW (ref 30.0–36.0)
MCV: 96.9 fL (ref 78.0–100.0)
PLATELETS: 178 10*3/uL (ref 150–400)
RBC: 4.24 MIL/uL (ref 4.22–5.81)
RDW: 15.3 % (ref 11.5–15.5)
WBC: 7 10*3/uL (ref 4.0–10.5)

## 2014-07-23 LAB — PRO B NATRIURETIC PEPTIDE: Pro B Natriuretic peptide (BNP): 14393 pg/mL — ABNORMAL HIGH (ref 0–450)

## 2014-07-23 LAB — I-STAT TROPONIN, ED: Troponin i, poc: 0.16 ng/mL (ref 0.00–0.08)

## 2014-07-23 MED ORDER — ACETAMINOPHEN 325 MG PO TABS
650.0000 mg | ORAL_TABLET | Freq: Four times a day (QID) | ORAL | Status: DC | PRN
  Administered 2014-07-24 – 2014-07-26 (×3): 650 mg via ORAL
  Filled 2014-07-23 (×3): qty 2

## 2014-07-23 MED ORDER — BUDESONIDE-FORMOTEROL FUMARATE 160-4.5 MCG/ACT IN AERO
2.0000 | INHALATION_SPRAY | Freq: Two times a day (BID) | RESPIRATORY_TRACT | Status: DC
  Administered 2014-07-23 – 2014-07-24 (×2): 2 via RESPIRATORY_TRACT
  Filled 2014-07-23: qty 6

## 2014-07-23 MED ORDER — ONDANSETRON HCL 4 MG PO TABS: 4.0000 mg | ORAL_TABLET | Freq: Four times a day (QID) | ORAL | Status: DC | PRN

## 2014-07-23 MED ORDER — FUROSEMIDE 10 MG/ML IJ SOLN
40.0000 mg | Freq: Every day | INTRAMUSCULAR | Status: DC
  Administered 2014-07-24 – 2014-07-26 (×3): 40 mg via INTRAVENOUS
  Filled 2014-07-23 (×4): qty 4

## 2014-07-23 MED ORDER — FUROSEMIDE 10 MG/ML IJ SOLN
40.0000 mg | Freq: Once | INTRAMUSCULAR | Status: AC
  Administered 2014-07-23: 40 mg via INTRAVENOUS
  Filled 2014-07-23: qty 4

## 2014-07-23 MED ORDER — PANTOPRAZOLE SODIUM 40 MG PO TBEC
40.0000 mg | DELAYED_RELEASE_TABLET | Freq: Every day | ORAL | Status: DC
  Administered 2014-07-24 – 2014-07-27 (×4): 40 mg via ORAL
  Filled 2014-07-23 (×4): qty 1

## 2014-07-23 MED ORDER — CEFTRIAXONE SODIUM 1 G IJ SOLR
1.0000 g | INTRAMUSCULAR | Status: DC
  Administered 2014-07-23 – 2014-07-26 (×4): 1 g via INTRAVENOUS
  Filled 2014-07-23 (×4): qty 10

## 2014-07-23 MED ORDER — ENOXAPARIN SODIUM 40 MG/0.4ML ~~LOC~~ SOLN
40.0000 mg | Freq: Every day | SUBCUTANEOUS | Status: DC
  Administered 2014-07-23 – 2014-07-26 (×4): 40 mg via SUBCUTANEOUS
  Filled 2014-07-23 (×4): qty 0.4

## 2014-07-23 MED ORDER — SODIUM CHLORIDE 0.9 % IJ SOLN
3.0000 mL | Freq: Two times a day (BID) | INTRAMUSCULAR | Status: DC
  Administered 2014-07-25 (×2): 3 mL via INTRAVENOUS

## 2014-07-23 MED ORDER — METOCLOPRAMIDE HCL 10 MG PO TABS
5.0000 mg | ORAL_TABLET | Freq: Every day | ORAL | Status: DC | PRN
Start: 2014-07-23 — End: 2014-07-27

## 2014-07-23 MED ORDER — ALBUTEROL SULFATE (2.5 MG/3ML) 0.083% IN NEBU: 3.0000 mL | INHALATION_SOLUTION | RESPIRATORY_TRACT | Status: DC | PRN

## 2014-07-23 MED ORDER — FENOFIBRATE 160 MG PO TABS
160.0000 mg | ORAL_TABLET | Freq: Every day | ORAL | Status: DC
  Administered 2014-07-24 – 2014-07-27 (×4): 160 mg via ORAL
  Filled 2014-07-23 (×4): qty 1

## 2014-07-23 MED ORDER — METOPROLOL TARTRATE 50 MG PO TABS
100.0000 mg | ORAL_TABLET | Freq: Every day | ORAL | Status: DC
  Administered 2014-07-24 – 2014-07-27 (×4): 100 mg via ORAL
  Filled 2014-07-23 (×5): qty 2

## 2014-07-23 MED ORDER — ACETAMINOPHEN 650 MG RE SUPP: 650.0000 mg | Freq: Four times a day (QID) | RECTAL | Status: DC | PRN

## 2014-07-23 MED ORDER — AZITHROMYCIN 250 MG PO TABS
500.0000 mg | ORAL_TABLET | Freq: Once | ORAL | Status: AC
  Administered 2014-07-23: 500 mg via ORAL
  Filled 2014-07-23: qty 2

## 2014-07-23 MED ORDER — DEXTROSE 5 % IV SOLN
500.0000 mg | INTRAVENOUS | Status: DC
  Administered 2014-07-24 – 2014-07-26 (×3): 500 mg via INTRAVENOUS
  Filled 2014-07-23 (×3): qty 500

## 2014-07-23 MED ORDER — ONDANSETRON HCL 4 MG/2ML IJ SOLN: 4.0000 mg | Freq: Four times a day (QID) | INTRAMUSCULAR | Status: DC | PRN

## 2014-07-23 MED ORDER — CAPTOPRIL 50 MG PO TABS
50.0000 mg | ORAL_TABLET | Freq: Two times a day (BID) | ORAL | Status: DC
  Administered 2014-07-23 – 2014-07-27 (×8): 50 mg via ORAL
  Filled 2014-07-23 (×8): qty 1

## 2014-07-23 MED ORDER — SODIUM CHLORIDE 0.9 % IJ SOLN
3.0000 mL | Freq: Two times a day (BID) | INTRAMUSCULAR | Status: DC
  Administered 2014-07-23 – 2014-07-26 (×5): 3 mL via INTRAVENOUS

## 2014-07-23 NOTE — ED Notes (Signed)
Per EMS: Pt c/o congested cough x 2 wks, coughing up yellow mucus. Pt seen at PCP office for abdominal pain, doctor states they found an abnormality on lungs. Pt's O2 on RA 88%, on 4 L Ray up to 99%. Lung sounds diminished at bases bilaterally. Hx of COPD. No N/V. Pt c/o intermittent diarrhea over the past couple wks. A&O x 4. Pt lives at home.

## 2014-07-23 NOTE — H&P (Addendum)
[facility] History and Physical  [name] GGY:[id number] DOB: [birth year] DOA: 07/23/2014  Referring physician: ER physician. PCP: [name], MD   Chief Complaint: Shortness of breath.  HPI: [name] is a 80 y.o. male with history of COPD, lung mass, chronic kidney disease stage III, hypertension and abdominal aortic aneurysm status post repair presents to the ER because of shortness of breath. Patient states that she has been short of breath last few weeks which is slowly worsen. Shortness of breath is mostly on exertion. Denies any chest pain or productive cough. In addition patient states that he has been having some epigastric discomfort for last few weeks with some nausea vomiting occasionally. On exam patient's abdomen is benign and patient does have bilateral lower extremity edema and JVD is elevated. Chest x-ray was showing congestion and possible infiltrates. Patient was given 40 mg IV Lasix for CHF and admitted for further workup.   Review of Systems: As presented in the history of presenting illness, rest negative.  Past Medical History  Diagnosis Date  . Hyperlipidemia   . Allergy   . COPD (chronic obstructive pulmonary disease)     via X-ray  . BPH (benign prostatic hyperplasia)     Hx of (Dr. Rosana Hoes)  . Gallstones     via CT  . Cigarette smoker   . CVA (cerebral infarction)   . Degeneration of intervertebral disc, site unspecified   . Stricture and stenosis of esophagus   . Unspecified gastritis and gastroduodenitis without mention of hemorrhage   . Duodenitis without mention of hemorrhage   . Benign neoplasm of colon   . Diverticulosis of colon (without mention of hemorrhage)   . Abdominal aneurysm without mention of rupture     followed by [facility] (Dr. Oneida Alar)  . Gout, unspecified   . Personal history of urinary calculi   . Hyperglycemia   . Low back pain   . Nausea   . Carotid arterial disease 08/2010    on [place] 60-80% L ICA and <40% R ICA  .  PAD (peripheral artery disease)   . Hypertension   . Stroke 2008    Mini Stroke   Past Surgical History  Procedure Laterality Date  . Inguinal hernia repair  03/21/2002    Bilateral  . Esophagogastroduodenoscopy  12/15/2004    Duodenitis, gastritis, esoph stricture, path neg  . Ct of abdomen  10/09/2004    5.1 cm AAA gallstone; 7 cm. stone in the pole of the right kidney  . Ct of pelvis  10/09/2004    Normal except for divertic of bladder  . [place] of aaa  07/06/2005    Unchanged 4.9 cm. (Dr. Amedeo Plenty)  . [place] of aaa  12/24/2005    5.2 cm  . [place] of abdomen  06/24/2006    5.5 cm  . Adenosine myoview  07/13/2007    Normal  . Abdominal aortogram  07/29/2006    Infrafenal abdominal aortic aneurysm with total occulision of inferior mesenteric artery  . Abdomen ct of angio  07/22/2006    Right kidney stone, L4/5/S1 DDD; AAA 5.3 cm. gallstones  . Aaa stent graft  08/31/2006    Right artery repair -- S/P Post op CVA  . Ct of head  09/01/2006    Small vessel changes  . Pre-surgery carotid US      Mod right ICA; mild left ICA stenosis  . Mri brain  09/01/2006    Normal  . Abdominal US  11/18/2006  Normal  . Toe amputation      L 4th 2012  . Femoral-popliteal bypass graft      left, 2012  . Cataract extraction      2012, bilateral  . Eye surgery     Social History:  reports that he has been smoking Cigarettes.  He has a 50 pack-year smoking history. He has never used smokeless tobacco. He reports that he does not drink alcohol or use illicit drugs. Where does patient live home. Can patient participate in ADLs? Yes.  Allergies  Allergen Reactions  . Aspirin     REACTION: GI intolerance  . Sulfonamide Derivatives     REACTION: nausea    Family History:  Family History  Problem Relation Age of Onset  . Heart disease Mother     MI  . Hypertension Mother   . Varicose Veins Mother   . Heart attack Mother   . Aneurysm Father   . Diabetes Neg Hx   . Arthritis Neg Hx   . Depression Neg  Hx   . Alcohol abuse Neg Hx   . Drug abuse Neg Hx   . Colon cancer Neg Hx   . Prostate cancer Neg Hx   . Cancer Brother     Lung  . Heart attack Brother   . Heart disease Brother     MI      Prior to Admission medications   Medication Sig Start Date End Date Taking? Authorizing Provider  acetaminophen (TYLENOL) 325 MG tablet Take 650 mg by mouth every 6 (six) hours as needed for moderate pain (pain).    Yes Historical Provider, MD  albuterol (PROVENTIL HFA;VENTOLIN HFA) 108 (90 BASE) MCG/ACT inhaler Inhale 2 puffs into the lungs every 4 (four) hours as needed for wheezing. 05/16/14 05/15/15 Yes Tonia Ghent, MD  budesonide-formoterol [place]) 160-4.5 MCG/ACT inhaler Inhale 2 puffs into the lungs 2 (two) times daily. 05/16/14  Yes Tonia Ghent, MD  captopril (CAPOTEN) 50 MG tablet Take 1 tablet (50 mg total) by mouth 2 (two) times daily. 02/15/14  Yes Tonia Ghent, MD  Cholecalciferol (VITAMIN D PO) Take 5,000 Units by mouth daily.   Yes Historical Provider, MD  Coral Calcium 1000 (390 CA) MG TABS Take 1 tablet by mouth daily.   Yes Historical Provider, MD  fenofibrate 160 MG tablet Take 1 tablet (160 mg total) by mouth daily. 02/15/14  Yes Tonia Ghent, MD  metoCLOPramide (REGLAN) 5 MG tablet Take 1 tablet (5 mg total) by mouth daily as needed. 02/15/14  Yes Tonia Ghent, MD  metoprolol (LOPRESSOR) 100 MG tablet Take 1 tablet (100 mg total) by mouth daily. 02/15/14  Yes Tonia Ghent, MD  OVER THE COUNTER MEDICATION Take 2 tablets by mouth daily as needed (breathing).    Yes Historical Provider, MD  pantoprazole (PROTONIX) 40 MG tablet TAKE 1 TABLET BY MOUTH DAILY 02/15/14  Yes Tonia Ghent, MD  vitamin E 400 UNIT capsule Take 400 Units by mouth daily.   Yes Historical Provider, MD    Physical Exam: Filed Vitals:   07/23/14 1813 07/23/14 1816 07/23/14 1920 07/23/14 2121  BP:  159/76 157/77 151/66  Pulse:  70 77 84  Temp:  98.7 F (37.1 C)    TempSrc:  Oral    Resp:  26  24 30   Height:  6\' 4"  (1.93 m)    Weight:  83.915 kg (185 lb)    SpO2: 99% 100% 98% 94%  General:  Well-developed and nourished.  Eyes: Anicteric no pallor.  ENT: No discharge from the ears eyes nose more.  Neck: No mass felt. JVD elevated.  Cardiovascular: S1-S2 heard.  Respiratory: No rhonchi or crepitations.  Abdomen: Soft nontender bowel sounds present.  Skin: No rash.  Musculoskeletal bilateral lower extremity edema.  Psychiatric: Appears normal.  Neurologic: Alert awake oriented to time place and person. Moves all extremities.  Labs on Admission:  Basic Metabolic Panel:  Recent Labs Lab 07/23/14 1918  NA 145  K 4.5  CL 105  CO2 28  GLUCOSE 94  BUN 33*  CREATININE 1.48*  CALCIUM 9.4   Liver Function Tests:  Recent Labs Lab 07/23/14 1918  AST 129*  ALT 152*  ALKPHOS 43  BILITOT 0.5  PROT 7.5  ALBUMIN 3.0*   No results for input(s): LIPASE, AMYLASE in the last 168 hours. No results for input(s): AMMONIA in the last 168 hours. CBC:  Recent Labs Lab 07/23/14 1918  WBC 7.0  HGB 12.1*  HCT 41.1  MCV 96.9  PLT 178   Cardiac Enzymes: No results for input(s): CKTOTAL, CKMB, CKMBINDEX, TROPONINI in the last 168 hours.  BNP (last 3 results)  Recent Labs  07/23/14 1918  PROBNP 14393.0*   CBG: No results for input(s): GLUCAP in the last 168 hours.  Radiological Exams on Admission: Dg Chest 2 View  07/23/2014   CLINICAL DATA:  Cough, congestion, shortness of breath and weakness for several days, personal history of COPD, smoking, stroke, hypertension  EXAM: CHEST  2 VIEW  COMPARISON:  09/21/2013  FINDINGS: Enlargement of cardiac silhouette with pulmonary vascular congestion.  Mediastinal contour stable.  Atherosclerotic calcification aorta.  COPD changes with bibasilar atelectasis versus infiltrate.  No pleural effusion or pneumothorax.  Abdominal aortic endo stent.  Osseous demineralization with broad-based dextro convex thoracic  scoliosis and degenerative disc disease changes.  IMPRESSION: Enlargement of cardiac silhouette with pulmonary vascular congestion.  Mild bibasilar atelectasis versus infiltrate.   Electronically Signed   By: Lavonia Dana M.D.   On: 07/23/2014 19:15    EKG: Independently reviewed. Normal sinus rhythm. RBBB.  Assessment/Plan Principal Problem:   CHF (congestive heart failure) Active Problems:   Essential hypertension   COPD (chronic obstructive pulmonary disease)   Community acquired pneumonia   Abdominal discomfort   1. Decompensated CHF unspecified EF not known - probably new onset. Patient's point of care troponin is positive and I have ordered a regular troponins. For now we will keep patient nothing by mouth. Continue with Lasix 40 mg IV daily. Cycle cardiac markers. Patient is allergic to aspirin. If patient's regular troponin turns out to be positive then we will keep patient on heparin and Plavix and at the time we will consult cardiologist earlier or otherwise cardiologist can be consult in a.m. Check 2-D echo. 2. Elevated LFTs with abdominal discomfort - may be secondary to CHF. But since patient also had nausea vomiting we will check sonogram of abdomen to rule out gallbladder pathology. Patient is presently nothing by mouth.Follow LFTs closely. Check lipase. Check acute hepatitis panel. 3. Possible pneumonia - patient has been placed on empiric antibiotics. Check urine Legionella and strep and influenza PCR. 4. COPD - presently not wheezing. 5. Mild anemia - follow CBC. 6. Hypertension - continue captopril and metoprolol. 7. Chronic and disease stage III - creatinine appears to be at baseline. 8. Lung mass - as per the pulmonary notes patient probably has lung malignancy and patient is not pursuing further  workup on this.    Code Status: Full code.  Family Communication: Patient's wife at the bedside.  Disposition Plan: Admit to inpatient.    Zianna Dercole N. [facility] Pager [phone].  If 7PM-7AM, please contact night-coverage [email or web address] Password [facility] 07/23/2014, 9:48 PM

## 2014-07-23 NOTE — ED Notes (Signed)
Writer notifed EDP of abnormal I-stat results.

## 2014-07-23 NOTE — Assessment & Plan Note (Signed)
Known severe COPD. Improved on 2L [place].  EMS promptly called, in route.   I told him it was not reasonable to have him go home at this point; EMS/ER eval was reasonable.   Will likely need routine imaging and labs done in ER.  I didn't give albuterol since he wasn't wheezing and I didn't want to make him more tachycardic.  Known 4 cm macro lobulated mass in the left lower lobe demonstrates internal areas of lucency/cavitation and is highly concerning for a primary bronchogenic carcinoma- that was on CT back in 09/2013.  To ER.

## 2014-07-23 NOTE — Patient Instructions (Signed)
To ER

## 2014-07-23 NOTE — Progress Notes (Signed)
Pre visit review using our clinic review tool, if applicable. No additional management support is needed unless otherwise documented below in the visit note.  He has been more forgetful recently, ie started the car and forgot it.  Forgot his partial denture.  He forgot an appointment this AM.  This isn't typical for him.    No fevers, no chills, no vomiting.  Some nausea over the last month.   Some SOB per patient report, gradually worse.  Some sputum, at baseline.  Some more BLE edema recently.    Meds, vitals, and allergies reviewed.   ROS: See HPI.  Otherwise, noncontributory.  nad ncat Mmm Op with some mild irritation Neck supple, no LA rrr No inc in wob but LLL crackles noted  abd soft, not ttp 1+ BLE edema.   Hypoxia noted on exam, pulse ox up to 95% with 2L [place].

## 2014-07-23 NOTE — ED Notes (Signed)
Bed: WA07 Expected date:  Expected time:  Means of arrival:  Comments: EMS  Low O2, poss pna

## 2014-07-23 NOTE — ED Provider Notes (Signed)
CSN: [id number]     Arrival date & time 07/23/14  1808 History   First MD Initiated Contact with Patient 07/23/14 1816     Chief Complaint  Patient presents with  . lung abnormality   . Shortness of Breath   HPI For the last week pt has been nauseated.  He has been able to eat and drink but doesn't feel quite right.  No vomiting.  He has had some loose stools.  He has been coughing, brining up yellow mucus.  No fever.  Pt has history of COPD.  Went to the doctors office and was hypoxic in the high 80s.  Improved with [place] oxygen.  Doctor was concerned about pna.  Sent to the ED for further evaluation.  Past Medical History  Diagnosis Date  . Hyperlipidemia   . Allergy   . COPD (chronic obstructive pulmonary disease)     via X-ray  . BPH (benign prostatic hyperplasia)     Hx of (Dr. Rosana Hoes)  . Gallstones     via CT  . Cigarette smoker   . CVA (cerebral infarction)   . Degeneration of intervertebral disc, site unspecified   . Stricture and stenosis of esophagus   . Unspecified gastritis and gastroduodenitis without mention of hemorrhage   . Duodenitis without mention of hemorrhage   . Benign neoplasm of colon   . Diverticulosis of colon (without mention of hemorrhage)   . Abdominal aneurysm without mention of rupture     followed by [facility] (Dr. Oneida Alar)  . Gout, unspecified   . Personal history of urinary calculi   . Hyperglycemia   . Low back pain   . Nausea   . Carotid arterial disease 08/2010    on [place] 60-80% L ICA and <40% R ICA  . PAD (peripheral artery disease)   . Hypertension   . Stroke 2008    Mini Stroke   Past Surgical History  Procedure Laterality Date  . Inguinal hernia repair  03/21/2002    Bilateral  . Esophagogastroduodenoscopy  12/15/2004    Duodenitis, gastritis, esoph stricture, path neg  . Ct of abdomen  10/09/2004    5.1 cm AAA gallstone; 7 cm. stone in the pole of the right kidney  . Ct of pelvis  10/09/2004    Normal except for divertic of bladder  . [place] of  aaa  07/06/2005    Unchanged 4.9 cm. (Dr. Amedeo Plenty)  . [place] of aaa  12/24/2005    5.2 cm  . [place] of abdomen  06/24/2006    5.5 cm  . Adenosine myoview  07/13/2007    Normal  . Abdominal aortogram  07/29/2006    Infrafenal abdominal aortic aneurysm with total occulision of inferior mesenteric artery  . Abdomen ct of angio  07/22/2006    Right kidney stone, L4/5/S1 DDD; AAA 5.3 cm. gallstones  . Aaa stent graft  08/31/2006    Right artery repair -- S/P Post op CVA  . Ct of head  09/01/2006    Small vessel changes  . Pre-surgery carotid US      Mod right ICA; mild left ICA stenosis  . Mri brain  09/01/2006    Normal  . Abdominal US  11/18/2006    Normal  . Toe amputation      L 4th 2012  . Femoral-popliteal bypass graft      left, 2012  . Cataract extraction      2012, bilateral  . Eye surgery  Family History  Problem Relation Age of Onset  . Heart disease Mother     MI  . Hypertension Mother   . Varicose Veins Mother   . Heart attack Mother   . Aneurysm Father   . Diabetes Neg Hx   . Arthritis Neg Hx   . Depression Neg Hx   . Alcohol abuse Neg Hx   . Drug abuse Neg Hx   . Colon cancer Neg Hx   . Prostate cancer Neg Hx   . Cancer Brother     Lung  . Heart attack Brother   . Heart disease Brother     MI   History  Substance Use Topics  . Smoking status: Current Some Day Smoker -- 1.00 packs/day for 50 years    Types: Cigarettes    Last Attempt to Quit: 08/26/2013  . Smokeless tobacco: Never Used  . Alcohol Use: No    Review of Systems  All other systems reviewed and are negative.     Allergies  Aspirin and Sulfonamide derivatives  Home Medications   Prior to Admission medications   Medication Sig Start Date End Date Taking? Authorizing Provider  acetaminophen (TYLENOL) 325 MG tablet Take 650 mg by mouth every 6 (six) hours as needed for moderate pain (pain).    Yes Historical Provider, MD  albuterol (PROVENTIL HFA;VENTOLIN HFA) 108 (90 BASE) MCG/ACT  inhaler Inhale 2 puffs into the lungs every 4 (four) hours as needed for wheezing. 05/16/14 05/15/15 Yes Tonia Ghent, MD  budesonide-formoterol [facility]) 160-4.5 MCG/ACT inhaler Inhale 2 puffs into the lungs 2 (two) times daily. 05/16/14  Yes Tonia Ghent, MD  captopril (CAPOTEN) 50 MG tablet Take 1 tablet (50 mg total) by mouth 2 (two) times daily. 02/15/14  Yes Tonia Ghent, MD  Cholecalciferol (VITAMIN D PO) Take 5,000 Units by mouth daily.   Yes Historical Provider, MD  Coral Calcium 1000 (390 CA) MG TABS Take 1 tablet by mouth daily.   Yes Historical Provider, MD  fenofibrate 160 MG tablet Take 1 tablet (160 mg total) by mouth daily. 02/15/14  Yes Tonia Ghent, MD  metoCLOPramide (REGLAN) 5 MG tablet Take 1 tablet (5 mg total) by mouth daily as needed. 02/15/14  Yes Tonia Ghent, MD  metoprolol (LOPRESSOR) 100 MG tablet Take 1 tablet (100 mg total) by mouth daily. 02/15/14  Yes Tonia Ghent, MD  OVER THE COUNTER MEDICATION Take 2 tablets by mouth daily as needed (breathing).    Yes Historical Provider, MD  pantoprazole (PROTONIX) 40 MG tablet TAKE 1 TABLET BY MOUTH DAILY 02/15/14  Yes Tonia Ghent, MD  vitamin E 400 UNIT capsule Take 400 Units by mouth daily.   Yes Historical Provider, MD   BP 157/77 mmHg  Pulse 77  Temp(Src) 98.7 F (37.1 C) (Oral)  Resp 24  Ht 6\' 4"  (1.93 m)  Wt 185 lb (83.915 kg)  BMI 22.53 kg/m2  SpO2 98% Physical Exam  Constitutional: He appears well-developed and well-nourished. No distress.  HENT:  Head: Normocephalic and atraumatic.  Right Ear: External ear normal.  Left Ear: External ear normal.  Eyes: Conjunctivae are normal. Right eye exhibits no discharge. Left eye exhibits no discharge. No scleral icterus.  Neck: Neck supple. No tracheal deviation present.  Cardiovascular: Normal rate, regular rhythm and intact distal pulses.   Pulmonary/Chest: Effort normal. No stridor. Tachypnea noted. No respiratory distress. He has decreased breath  sounds (in all lung fields). He has no wheezes. He  has no rales.  Abdominal: Soft. Bowel sounds are normal. He exhibits no distension. There is no tenderness. There is no rebound and no guarding.  Musculoskeletal: He exhibits edema. He exhibits no tenderness.  Mild edema bilateral le below the knees  Neurological: He is alert. He has normal strength. No cranial nerve deficit (no facial droop, extraocular movements intact, no slurred speech) or sensory deficit. He exhibits normal muscle tone. He displays no seizure activity. Coordination normal.  Skin: Skin is warm and dry. No rash noted.  Psychiatric: He has a normal mood and affect.  Nursing note and vitals reviewed.   ED Course  Procedures (including critical care time) Labs Review Labs Reviewed  CBC - Abnormal; Notable for the following:    Hemoglobin 12.1 (*)    MCHC 29.4 (*)    All other components within normal limits  PRO B NATRIURETIC PEPTIDE - Abnormal; Notable for the following:    Pro B Natriuretic peptide (BNP) 14393.0 (*)    All other components within normal limits  COMPREHENSIVE METABOLIC PANEL - Abnormal; Notable for the following:    BUN 33 (*)    Creatinine, Ser 1.48 (*)    Albumin 3.0 (*)    AST 129 (*)    ALT 152 (*)    GFR calc non Af Amer 43 (*)    GFR calc Af Amer 50 (*)    All other components within normal limits  I-STAT TROPOININ, ED - Abnormal; Notable for the following:    Troponin i, poc 0.16 (*)    All other components within normal limits    Imaging Review Dg Chest 2 View  07/23/2014   CLINICAL DATA:  Cough, congestion, shortness of breath and weakness for several days, personal history of COPD, smoking, stroke, hypertension  EXAM: CHEST  2 VIEW  COMPARISON:  09/21/2013  FINDINGS: Enlargement of cardiac silhouette with pulmonary vascular congestion.  Mediastinal contour stable.  Atherosclerotic calcification aorta.  COPD changes with bibasilar atelectasis versus infiltrate.  No pleural effusion or  pneumothorax.  Abdominal aortic endo stent.  Osseous demineralization with broad-based dextro convex thoracic scoliosis and degenerative disc disease changes.  IMPRESSION: Enlargement of cardiac silhouette with pulmonary vascular congestion.  Mild bibasilar atelectasis versus infiltrate.   Electronically Signed   By: Lavonia Dana M.D.   On: 07/23/2014 19:15     EKG Interpretation   Date/Time:  Monday July 23 2014 18:28:44 EST Ventricular Rate:  70 PR Interval:  149 QRS Duration: 148 QT Interval:  410 QTC Calculation: 442 R Axis:   -76 Text Interpretation:  Sinus rhythm RBBB and LAFB Baseline wander in  lead(s) V3 No significant change since last tracing Confirmed by Pina Sirianni   MD-J, Dhruvan Gullion ([id number]) on 07/23/2014 6:37:53 PM     Medications  furosemide (LASIX) injection 40 mg (not administered)  cefTRIAXone (ROCEPHIN) 1 g in dextrose 5 % 50 mL IVPB (not administered)  azithromycin (ZITHROMAX) tablet 500 mg (not administered)    MDM   Final diagnoses:  Acute congestive heart failure, unspecified congestive heart failure type  Chronic obstructive pulmonary disease, unspecified COPD, unspecified chronic bronchitis type  Hypoxia    The patient's laboratory tests show an elevated BNP and a slight increase in the troponin. I doubt acute cardiac ischemia.  Chest x-ray does show pulmonary vascular congestion. His findings to suggest a component of congestive heart failure contributing to his shortness of breath and cough.  The patient has noticed some yellow sputum and the x-ray also suggest the  possibility of a community-acquired pneumonia.  I have ordered IV Lasix and antibiotics to cover for continued coronary pneumonia.  We'll consult the medical service regarding admission for further treatment.   Dorie Rank, MD 07/23/14 2059

## 2014-07-24 ENCOUNTER — Inpatient Hospital Stay (HOSPITAL_COMMUNITY): Payer: Medicare Other

## 2014-07-24 DIAGNOSIS — I359 Nonrheumatic aortic valve disorder, unspecified: Secondary | ICD-10-CM

## 2014-07-24 LAB — CBC WITH DIFFERENTIAL/PLATELET
Basophils Absolute: 0.1 10*3/uL (ref 0.0–0.1)
Basophils Relative: 1 % (ref 0–1)
EOS ABS: 0.1 10*3/uL (ref 0.0–0.7)
Eosinophils Relative: 1 % (ref 0–5)
HCT: 43.6 % (ref 39.0–52.0)
HEMOGLOBIN: 12.8 g/dL — AB (ref 13.0–17.0)
LYMPHS PCT: 17 % (ref 12–46)
Lymphs Abs: 1.5 10*3/uL (ref 0.7–4.0)
MCH: 28.8 pg (ref 26.0–34.0)
MCHC: 29.4 g/dL — ABNORMAL LOW (ref 30.0–36.0)
MCV: 98.2 fL (ref 78.0–100.0)
MONOS PCT: 12 % (ref 3–12)
Monocytes Absolute: 1.1 10*3/uL — ABNORMAL HIGH (ref 0.1–1.0)
Neutro Abs: 6.3 10*3/uL (ref 1.7–7.7)
Neutrophils Relative %: 69 % (ref 43–77)
Platelets: 206 10*3/uL (ref 150–400)
RBC: 4.44 MIL/uL (ref 4.22–5.81)
RDW: 15.3 % (ref 11.5–15.5)
WBC: 9.1 10*3/uL (ref 4.0–10.5)

## 2014-07-24 LAB — COMPREHENSIVE METABOLIC PANEL
ALT: 143 U/L — AB (ref 0–53)
AST: 101 U/L — AB (ref 0–37)
Albumin: 3.1 g/dL — ABNORMAL LOW (ref 3.5–5.2)
Alkaline Phosphatase: 45 U/L (ref 39–117)
Anion gap: 9 (ref 5–15)
BUN: 30 mg/dL — ABNORMAL HIGH (ref 6–23)
CALCIUM: 9.4 mg/dL (ref 8.4–10.5)
CO2: 37 mEq/L — ABNORMAL HIGH (ref 19–32)
CREATININE: 1.63 mg/dL — AB (ref 0.50–1.35)
Chloride: 100 mEq/L (ref 96–112)
GFR calc Af Amer: 45 mL/min — ABNORMAL LOW (ref >90)
GFR calc non Af Amer: 38 mL/min — ABNORMAL LOW (ref >90)
Glucose, Bld: 100 mg/dL — ABNORMAL HIGH (ref 70–99)
Potassium: 4.1 mEq/L (ref 3.7–5.3)
SODIUM: 146 meq/L (ref 137–147)
TOTAL PROTEIN: 7.8 g/dL (ref 6.0–8.3)
Total Bilirubin: 0.6 mg/dL (ref 0.3–1.2)

## 2014-07-24 LAB — TROPONIN I
Troponin I: <0.3 ng/mL (ref <0.30)
Troponin I: <0.3 ng/mL (ref <0.30)

## 2014-07-24 LAB — MRSA PCR SCREENING: MRSA BY PCR: POSITIVE — AB

## 2014-07-24 LAB — STREP PNEUMONIAE URINARY ANTIGEN: Strep Pneumo Urinary Antigen: NEGATIVE

## 2014-07-24 LAB — HEPATITIS PANEL, ACUTE
HCV AB: NEGATIVE
HEP B S AG: NEGATIVE
Hep A IgM: NONREACTIVE
Hep B C IgM: NONREACTIVE

## 2014-07-24 LAB — LIPASE, BLOOD: LIPASE: 103 U/L — AB (ref 11–59)

## 2014-07-24 LAB — TSH: TSH: 0.776 u[IU]/mL (ref 0.350–4.500)

## 2014-07-24 MED ORDER — BUDESONIDE 0.25 MG/2ML IN SUSP
0.2500 mg | Freq: Two times a day (BID) | RESPIRATORY_TRACT | Status: DC
  Administered 2014-07-24 – 2014-07-27 (×6): 0.25 mg via RESPIRATORY_TRACT
  Filled 2014-07-24 (×6): qty 2

## 2014-07-24 MED ORDER — CHLORHEXIDINE GLUCONATE CLOTH 2 % EX PADS
6.0000 | MEDICATED_PAD | Freq: Every day | CUTANEOUS | Status: DC
  Administered 2014-07-24 – 2014-07-27 (×4): 6 via TOPICAL

## 2014-07-24 MED ORDER — MUPIROCIN 2 % EX OINT
1.0000 | TOPICAL_OINTMENT | Freq: Two times a day (BID) | CUTANEOUS | Status: DC
Start: 2014-07-24 — End: 2014-07-27
  Administered 2014-07-24 – 2014-07-27 (×7): 1 via NASAL
  Filled 2014-07-24: qty 22

## 2014-07-24 NOTE — Care Management Note (Addendum)
    Page 1 of 2   07/27/2014     12:35:45 PM CARE MANAGEMENT NOTE 07/27/2014  Patient:  [name], [name]   Account Number:  [id number]  Date Initiated:  07/24/2014  Documentation initiated by:  Dessa Phi  Subjective/Objective Assessment:   80 Y/O M ADMITTED W/CHF.     Action/Plan:   FROM HOME.   Anticipated DC Date:  07/27/2014   Anticipated DC Plan:  [place]  CM consult      Choice offered to / List presented to:  C-1 Patient   DME arranged  [facility]      DME agency  [facility] arranged  HH-1 RN  [place].   Status of service:  Completed, signed off Medicare Important Message given?  YES (If response is "NO", the following Medicare IM given date fields will be blank) Date Medicare IM given:  07/26/2014 Medicare IM given by:  [facility] Date Additional Medicare IM given:   Additional Medicare IM given by:    Discharge Disposition:  [place]  Per UR Regulation:  Reviewed for med. necessity/level of care/duration of stay  If discussed at [place] of Stay Meetings, dates discussed:    Comments:  07/27/14 Lei Dower RN,BSN NCM [place] providing home 02 to be brought to rm.[facility] rep for hhc received hhc orders, & aware of d/c.  07/24/14 Vickee Mormino RN,BSN NCM [phone] WOULD RECOMMEND PT CONS WHEN APPROPRIATE.

## 2014-07-24 NOTE — Progress Notes (Signed)
ANTIBIOTIC CONSULT NOTE - INITIAL  Pharmacy Consult for Rocephin Indication: CAP  Allergies  Allergen Reactions  . Aspirin     REACTION: GI intolerance  . Sulfonamide Derivatives     REACTION: nausea    Patient Measurements: Height: 6\' 4"  (193 cm) Weight: 188 lb 15 oz (85.7 kg) IBW/kg (Calculated) : 86.8   Vital Signs: Temp: 97.8 F (36.6 C) (12/07 2211) Temp Source: Oral (12/07 2211) BP: 161/70 mmHg (12/07 2211) Pulse Rate: 93 (12/07 2211) Intake/Output from previous day: 12/07 0701 - 12/08 0700 In: 50 [IV Piggyback:50] Out: 6945 [Urine:1335] Intake/Output from this shift: Total I/O In: 50 [IV Piggyback:50] Out: 1335 [Urine:1335]  Labs:  Recent Labs  07/23/14 1918  WBC 7.0  HGB 12.1*  PLT 178  CREATININE 1.48*   Estimated Creatinine Clearance: 49.1 mL/min (by C-G formula based on Cr of 1.48). No results for input(s): VANCOTROUGH, VANCOPEAK, VANCORANDOM, GENTTROUGH, GENTPEAK, GENTRANDOM, TOBRATROUGH, TOBRAPEAK, TOBRARND, AMIKACINPEAK, AMIKACINTROU, AMIKACIN in the last 72 hours.   Microbiology: No results found for this or any previous visit (from the past 720 hour(s)).  Medical History: Past Medical History  Diagnosis Date  . Hyperlipidemia   . Allergy   . COPD (chronic obstructive pulmonary disease)     via X-ray  . BPH (benign prostatic hyperplasia)     Hx of (Dr. Rosana Hoes)  . Gallstones     via CT  . Cigarette smoker   . CVA (cerebral infarction)   . Degeneration of intervertebral disc, site unspecified   . Stricture and stenosis of esophagus   . Unspecified gastritis and gastroduodenitis without mention of hemorrhage   . Duodenitis without mention of hemorrhage   . Benign neoplasm of colon   . Diverticulosis of colon (without mention of hemorrhage)   . Abdominal aneurysm without mention of rupture     followed by [facility] (Dr. Oneida Alar)  . Gout, unspecified   . Personal history of urinary calculi   . Hyperglycemia   . Low back pain   . Nausea   .  Carotid arterial disease 08/2010    on [place] 60-80% L ICA and <40% R ICA  . PAD (peripheral artery disease)   . Hypertension   . Stroke 2008    Mini Stroke    Medications:  Scheduled:  . azithromycin  500 mg Intravenous Q24H  . budesonide-formoterol  2 puff Inhalation BID  . captopril  50 mg Oral BID  . cefTRIAXone (ROCEPHIN)  IV  1 g Intravenous Q24H  . enoxaparin (LOVENOX) injection  40 mg Subcutaneous QHS  . fenofibrate  160 mg Oral Daily  . furosemide  40 mg Intravenous Daily  . metoprolol  100 mg Oral Daily  . pantoprazole  40 mg Oral Daily  . sodium chloride  3 mL Intravenous Q12H  . sodium chloride  3 mL Intravenous Q12H   Infusions:   Assessment: 42 yoM c/o SOB. Rocephin per Rx and Zmax per MD for CAP.  Goal of Therapy:  Vancomycin trough level 15-20 mcg/ml  Plan:   Rocephin 1Gm IV q24h   F/u cultures as needed  Lawana Pai R 07/24/2014,12:59 AM

## 2014-07-24 NOTE — Progress Notes (Signed)
PT demonstrated verbal and hands on understanding of Flutter device. 

## 2014-07-24 NOTE — Progress Notes (Signed)
Echocardiogram 2D Echocardiogram has been performed.  [name] 07/24/2014, 10:04 AM

## 2014-07-24 NOTE — Progress Notes (Signed)
[facility] PROGRESS NOTE Interim History: 80 y/o male who presented to ED with increase SOB. Found to have CAP and vascular congestion suggesting acute CHF. No prior history of CHF per records. BNP 14,393. Patient admitted for treatment of SOB due to CHF and CAP.  Filed Weights   07/23/14 1816 07/23/14 2207 07/24/14 0449  Weight: 83.915 kg (185 lb) 85.7 kg (188 lb 15 oz) 83.734 kg (184 lb 9.6 oz)        Intake/Output Summary (Last 24 hours) at 07/24/14 1146 Last data filed at 07/24/14 6295  Gross per 24 hour  Intake     50 ml  Output   2840 ml  Net  -2790 ml     Assessment/Plan: 1-acute on chronic CHF (congestive heart failure): per presentation and history appears to be diastolic in nature. -will follow 2D echo -troponin neg and currently no complaining of CP -will continue low sodium diet, strict I's and O's and daily weights -will continue IV diuretics and will start flutter valve -good  Urine output overnight 2,790 L and approx 4 pounds off  2-Community acquired pneumonia: continue current antibiotics -will start pulmicort -PRN oxygen supplementation -start flutter valve -follow clinical response  -continue IV rocephin and zithromax  3-Abdominal discomfort: abd US demonstrating cholelithiasis w/o cholecystitis; otherwise unremarkable -lipase slightly elevated -patient reports no abd pain, no further nausea  -will continue full liquid diet for now; that will provide bowel rest as well -continue PRN antiemetics and PRN analgesics     4-COPD (chronic obstructive pulmonary disease): overall stable to slightly wheezing -will start pulmicort and continue PRN albuterol -started on flutter valve -continue PRN oxygen supplementation   5-Essential hypertension: stable overall -will follow VS and adjust medications as needed   Code Status: Full Family Communication: daughter at [place] Disposition Plan: to be determine    Consultants:  None    Procedures: ECHO: done 07/24/14, but results pending   Antibiotics:  Rocephin 12/7  Zithromax 12/7  HPI/Subjective: Afebrile, No Acute distress; denies fever, CP, nausea, vomiting or any other complaints. Still mild SOB, but much better than on admission.  Objective: Filed Vitals:   07/24/14 0449 07/24/14 0643 07/24/14 0646 07/24/14 0912  BP:  181/71 148/75   Pulse:  87    Temp:  99.7 F (37.6 C)    TempSrc:  Oral    Resp:  28    Height:      Weight: 83.734 kg (184 lb 9.6 oz)     SpO2:  94%  95%     Exam:  General: Alert, awake, oriented x3,feeling better already; but still with some SOB and orthopnea HEENT: No bruits, no goiter. Positive mild JVD Heart: no rubs or gallops, S1 and S2 appreciated on exam; trace to 1+ edema LE bilaterally Lungs: slight exp wheezing, scattered rhonchi and positive bibasilar crackles Abdomen: Soft, nontender, nondistended, positive bowel sounds.  Neuro: Grossly intact, nonfocal.   Data Reviewed: Basic Metabolic Panel:  Recent Labs Lab 07/23/14 1918 07/24/14 0501  NA 145 146  K 4.5 4.1  CL 105 100  CO2 28 37*  GLUCOSE 94 100*  BUN 33* 30*  CREATININE 1.48* 1.63*  CALCIUM 9.4 9.4   Liver Function Tests:  Recent Labs Lab 07/23/14 1918 07/24/14 0501  AST 129* 101*  ALT 152* 143*  ALKPHOS 43 45  BILITOT 0.5 0.6  PROT 7.5 7.8  ALBUMIN 3.0* 3.1*    Recent Labs Lab 07/24/14 0501  LIPASE 103*   CBC:  Recent  Labs Lab 07/23/14 1918 07/24/14 0501  WBC 7.0 9.1  NEUTROABS  --  6.3  HGB 12.1* 12.8*  HCT 41.1 43.6  MCV 96.9 98.2  PLT 178 206   Cardiac Enzymes:  Recent Labs Lab 07/24/14 0501  TROPONINI <0.30   BNP (last 3 results)  Recent Labs  07/23/14 1918  PROBNP 14393.0*    Recent Results (from the past 240 hour(s))  MRSA PCR Screening     Status: Abnormal   Collection Time: 07/23/14 11:05 PM  Result Value Ref Range Status   MRSA by PCR POSITIVE (A) NEGATIVE Final    Comment:        The  GeneXpert MRSA Assay (FDA approved for NASAL specimens only), is one component of a comprehensive MRSA colonization surveillance program. It is not intended to diagnose MRSA infection nor to guide or monitor treatment for MRSA infections. RESULT CALLED TO, READ BACK BY AND VERIFIED WITH: Wynelle Cleveland [id number] @ 0410 BY J SCOTTON      Studies: Dg Chest 2 View  07/23/2014   CLINICAL DATA:  Cough, congestion, shortness of breath and weakness for several days, personal history of COPD, smoking, stroke, hypertension  EXAM: CHEST  2 VIEW  COMPARISON:  09/21/2013  FINDINGS: Enlargement of cardiac silhouette with pulmonary vascular congestion.  Mediastinal contour stable.  Atherosclerotic calcification aorta.  COPD changes with bibasilar atelectasis versus infiltrate.  No pleural effusion or pneumothorax.  Abdominal aortic endo stent.  Osseous demineralization with broad-based dextro convex thoracic scoliosis and degenerative disc disease changes.  IMPRESSION: Enlargement of cardiac silhouette with pulmonary vascular congestion.  Mild bibasilar atelectasis versus infiltrate.   Electronically Signed   By: Lavonia Dana M.D.   On: 07/23/2014 19:15   US Abdomen Complete  07/24/2014   CLINICAL DATA:  80 year old male with abdominal discomfort x2 days  EXAM: ULTRASOUND ABDOMEN COMPLETE  COMPARISON:  Correlation with the CT from 11/01/2013  FINDINGS: Gallbladder: Multiple layering gallstones are present. The gallbladder wall measures approximately 4 mm. The sonographic Percell Miller sign is negative. There is no pericholecystic fluid  Common bile duct: Diameter: Is normal in size and measures approximately 4 mm  Liver: No focal lesion identified. Within normal limits in parenchymal echogenicity.  IVC: No abnormality visualized.  Pancreas: The pancreas is poorly visualized.  Spleen: Size and appearance within normal limits.  Right Kidney: Length: 10.5 cm. Echogenicity within normal limits. No mass or hydronephrosis  visualized. There is no nephrolithiasis.  Left Kidney: Length: 11.5 cm. Echogenicity within normal limits. No mass or hydronephrosis visualized. There is no nephrolithiasis.  Abdominal aorta: The abdominal aorta measures up to 2.7 cm along its mid aspect.  Other findings: None.  IMPRESSION: Cholelithiasis without evidence for acute cholecystitis.   Electronically Signed   By: Rosemarie Ax   On: 07/24/2014 08:32    Scheduled Meds: . azithromycin  500 mg Intravenous Q24H  . budesonide (PULMICORT) nebulizer solution  0.25 mg Nebulization BID  . captopril  50 mg Oral BID  . cefTRIAXone (ROCEPHIN)  IV  1 g Intravenous Q24H  . Chlorhexidine Gluconate Cloth  6 each Topical Q0600  . enoxaparin (LOVENOX) injection  40 mg Subcutaneous QHS  . fenofibrate  160 mg Oral Daily  . furosemide  40 mg Intravenous Daily  . metoprolol  100 mg Oral Daily  . mupirocin ointment  1 application Nasal BID  . pantoprazole  40 mg Oral Daily  . sodium chloride  3 mL Intravenous Q12H  . sodium chloride  3 mL  Intravenous Q12H   Continuous Infusions:   Time: 30 minutes (> 50% of time dedicated to coordinate care, face to face examination and to educate patient and daughter on condition and treatment; counseling of what type of diet to follow and importance of daily weights)  Barton Dubois  [facility] Pager [phone]. If 8PM-8AM, please contact night-coverage at [email or web address], password [facility] 07/24/2014, 11:46 AM  LOS: 1 day

## 2014-07-25 DIAGNOSIS — J438 Other emphysema: Secondary | ICD-10-CM

## 2014-07-25 DIAGNOSIS — I5041 Acute combined systolic (congestive) and diastolic (congestive) heart failure: Secondary | ICD-10-CM

## 2014-07-25 DIAGNOSIS — I10 Essential (primary) hypertension: Secondary | ICD-10-CM

## 2014-07-25 DIAGNOSIS — I509 Heart failure, unspecified: Secondary | ICD-10-CM | POA: Insufficient documentation

## 2014-07-25 DIAGNOSIS — I420 Dilated cardiomyopathy: Secondary | ICD-10-CM | POA: Insufficient documentation

## 2014-07-25 LAB — LIPASE, BLOOD: Lipase: 69 U/L — ABNORMAL HIGH (ref 11–59)

## 2014-07-25 MED ORDER — ZOLPIDEM TARTRATE 5 MG PO TABS
5.0000 mg | ORAL_TABLET | Freq: Every evening | ORAL | Status: DC | PRN
Start: 2014-07-25 — End: 2014-07-27
  Administered 2014-07-26: 5 mg via ORAL
  Filled 2014-07-25: qty 1

## 2014-07-25 NOTE — Plan of Care (Signed)
Problem: Phase I Progression Outcomes Goal: Pain controlled with appropriate interventions Outcome: Completed/Met Date Met:  07/25/14 Goal: Voiding-avoid urinary catheter unless indicated Outcome: Completed/Met Date Met:  07/25/14     

## 2014-07-25 NOTE — Evaluation (Signed)
Physical Therapy Evaluation Patient Details Name: [name] MRN: [id number] DOB: [birth year] Today's Date: 07/25/2014   History of Present Illness  80 yo male admitted with CHF. Hx of COPD, lung mass, HTN, AAA, CVA, low back pain.   Clinical Impression  On eval, pt required Min assist for mobility-able to ambulate ~175 feet without device, on 4L O2. Sats appeared to drop into upper 80s with ambulation. Pt reported some shortness of breath as well. Wife present-d/c plan is for pt to return home with wife.     Follow Up Recommendations Home health PT;Supervision - Intermittent (depending on progress)    Equipment Recommendations  None recommended by PT    Recommendations for Other Services       Precautions / Restrictions Precautions Precautions: Fall Precaution Comments: monitor sats Restrictions Weight Bearing Restrictions: No      Mobility  Bed Mobility Overal bed mobility: Modified Independent                Transfers Overall transfer level: Needs assistance Equipment used: Rolling walker (2 wheeled) Transfers: Sit to/from Stand Sit to Stand: Min guard            Ambulation/Gait Ambulation/Gait assistance: Min assist Ambulation Distance (Feet): 175 Feet Assistive device: None Gait Pattern/deviations: Drifts right/left;Staggering right;Staggering left;Step-through pattern     General Gait Details: Intermittent unsteadiness requiring small amount of assist to correct. Ambulated on 4L [place] O2-sats reading varied but seemed to drop to upper 80s.   Stairs            Wheelchair Mobility    Modified Rankin (Stroke Patients Only)       Balance Overall balance assessment: Needs assistance         Standing balance support: No upper extremity supported Standing balance-Leahy Scale: Fair                               Pertinent Vitals/Pain Pain Assessment: No/denies pain    Home Living Family/patient expects to be discharged  to:: Private residence Living Arrangements: Spouse/significant other Available Help at Discharge: Family Type of Home: House Home Access: Stairs to enter Entrance Stairs-Rails: None Technical brewer of Steps: 1+1 Home Layout: One level Home Equipment: None      Prior Function Level of Independence: Independent               Hand Dominance        Extremity/Trunk Assessment   Upper Extremity Assessment: Overall WFL for tasks assessed           Lower Extremity Assessment: Generalized weakness      Cervical / Trunk Assessment: Normal  Communication   Communication: No difficulties  Cognition Arousal/Alertness: Awake/alert Behavior During Therapy: WFL for tasks assessed/performed Overall Cognitive Status: Within Functional Limits for tasks assessed (slow processing and command following at times)                      General Comments      Exercises        Assessment/Plan    PT Assessment Patient needs continued PT services  PT Diagnosis Difficulty walking;Generalized weakness   PT Problem List Decreased activity tolerance;Decreased balance;Decreased mobility  PT Treatment Interventions Gait training;Functional mobility training;Therapeutic activities;Patient/family education;Balance training;Therapeutic exercise   PT Goals (Current goals can be found in the Care Plan section) Acute Rehab PT Goals Patient Stated Goal: home with wife PT Goal Formulation: With  patient/family Time For Goal Achievement: 08/08/14 Potential to Achieve Goals: Good    Frequency Min 3X/week   Barriers to discharge        Co-evaluation               End of Session Equipment Utilized During Treatment: Gait belt;Oxygen Activity Tolerance: Patient tolerated treatment well Patient left: in chair;with call bell/phone within reach;with family/visitor present           Time: 3539-1225 PT Time Calculation (min) (ACUTE ONLY): 21 min   Charges:   PT  Evaluation $Initial PT Evaluation Tier I: 1 Procedure PT Treatments $Gait Training: 8-22 mins   PT G Codes:          Weston Anna, MPT Pager: [phone]

## 2014-07-25 NOTE — Consult Note (Addendum)
Patient ID: POSEIDON PAM MRN: [id number] DOB/AGE: [birth year] 80 y.o.  Admit date: 07/23/2014 Referring Physician: Elgergawy Primary Cardiologist: [place] Reason for Consultation: CHF, abnormal echo  HPI: 80 yo male with history of HLD, COPD, BPH, tobacco abuse, lung cancer, stage 3 CKD, CVA, AAA, carotid artery disease, HTN admitted with SOB and found to have CHF. He is known to have advanced stage lung cancer but has refused further workup. He has been followed by pulmonary. He has been seen in the past by Dr. Martinique in our practice but is not known to have CAD. Admitted with elevated BNP and found to have community acquired pneumonia and pulmonary edema c/w CHF. He has been diuresed and is feeling much better. NO chest pain. Echo with LVEF=45-50% with lateral wall akinesis, mild AS, mild MR. Normal stress myoview May 2012. Long history of tobacco abuse with COPD.   At this time he has no complaints. No chest pain. Breathing improved. LE edema improved. No palptitations.    Past Medical History  Diagnosis Date  . Hyperlipidemia   . Allergy   . COPD (chronic obstructive pulmonary disease)     via X-ray  . BPH (benign prostatic hyperplasia)     Hx of (Dr. Rosana Hoes)  . Gallstones     via CT  . Cigarette smoker   . CVA (cerebral infarction)   . Degeneration of intervertebral disc, site unspecified   . Stricture and stenosis of esophagus   . Unspecified gastritis and gastroduodenitis without mention of hemorrhage   . Duodenitis without mention of hemorrhage   . Benign neoplasm of colon   . Diverticulosis of colon (without mention of hemorrhage)   . Abdominal aneurysm without mention of rupture     followed by [facility] (Dr. Oneida Alar)  . Gout, unspecified   . Personal history of urinary calculi   . Hyperglycemia   . Low back pain   . Nausea   . Carotid arterial disease 08/2010    on [place] 60-80% L ICA and <40% R ICA  . PAD (peripheral artery disease)   . Hypertension   . Stroke 2008   Mini Stroke    Family History  Problem Relation Age of Onset  . Heart disease Mother     MI  . Hypertension Mother   . Varicose Veins Mother   . Heart attack Mother   . Aneurysm Father   . Diabetes Neg Hx   . Arthritis Neg Hx   . Depression Neg Hx   . Alcohol abuse Neg Hx   . Drug abuse Neg Hx   . Colon cancer Neg Hx   . Prostate cancer Neg Hx   . Cancer Brother     Lung  . Heart attack Brother   . Heart disease Brother     MI    History   Social History  . Marital Status: Married    Spouse Name: N/A    Number of Children: 1  . Years of Education: N/A   Occupational History  . [place] [employer] [place] History Main Topics  . Smoking status: Current Some Day Smoker -- 1.00 packs/day for 50 years    Types: Cigarettes    Last Attempt to Quit: 08/26/2013  . Smokeless tobacco: Never Used  . Alcohol Use: No  . Drug Use: No  . Sexual Activity: Not on file   Other Topics Concern  . Not on file  Social History Narrative   Remarried 2008, lives with wife.   1 daughter in [place].   Likes to travel.    Past Surgical History  Procedure Laterality Date  . Inguinal hernia repair  03/21/2002    Bilateral  . Esophagogastroduodenoscopy  12/15/2004    Duodenitis, gastritis, esoph stricture, path neg  . Ct of abdomen  10/09/2004    5.1 cm AAA gallstone; 7 cm. stone in the pole of the right kidney  . Ct of pelvis  10/09/2004    Normal except for divertic of bladder  . [place] of aaa  07/06/2005    Unchanged 4.9 cm. (Dr. Amedeo Plenty)  . [place] of aaa  12/24/2005    5.2 cm  . [place] of abdomen  06/24/2006    5.5 cm  . Adenosine myoview  07/13/2007    Normal  . Abdominal aortogram  07/29/2006    Infrafenal abdominal aortic aneurysm with total occulision of inferior mesenteric artery  . Abdomen ct of angio  07/22/2006    Right kidney stone, L4/5/S1 DDD; AAA 5.3 cm. gallstones  . Aaa stent graft  08/31/2006    Right artery repair -- S/P Post op CVA    . Ct of head  09/01/2006    Small vessel changes  . Pre-surgery carotid US      Mod right ICA; mild left ICA stenosis  . Mri brain  09/01/2006    Normal  . Abdominal US  11/18/2006    Normal  . Toe amputation      L 4th 2012  . Femoral-popliteal bypass graft      left, 2012  . Cataract extraction      2012, bilateral  . Eye surgery      Allergies  Allergen Reactions  . Aspirin     REACTION: GI intolerance  . Sulfonamide Derivatives     REACTION: nausea   Current meds:  . azithromycin  500 mg Intravenous Q24H  . budesonide (PULMICORT) nebulizer solution  0.25 mg Nebulization BID  . captopril  50 mg Oral BID  . cefTRIAXone (ROCEPHIN)  IV  1 g Intravenous Q24H  . Chlorhexidine Gluconate Cloth  6 each Topical Q0600  . enoxaparin (LOVENOX) injection  40 mg Subcutaneous QHS  . fenofibrate  160 mg Oral Daily  . furosemide  40 mg Intravenous Daily  . metoprolol  100 mg Oral Daily  . mupirocin ointment  1 application Nasal BID  . pantoprazole  40 mg Oral Daily  . sodium chloride  3 mL Intravenous Q12H  . sodium chloride  3 mL Intravenous Q12H    Review of systems complete and found to be negative unless listed above    Physical Exam: Blood pressure 140/66, pulse 68, temperature 97.9 F (36.6 C), temperature source Oral, resp. rate 22, height 6\' 4"  (1.93 m), weight 181 lb 10.5 oz (82.4 kg), SpO2 100 %.    General: Well developed, well nourished, NAD  HEENT: OP clear, mucus membranes moist  SKIN: warm, dry. No rashes.  Neuro: No focal deficits  Musculoskeletal: Muscle strength 5/5 all ext  Psychiatric: Mood and affect normal  Neck: No JVD, no carotid bruits, no thyromegaly, no lymphadenopathy.  Lungs:Clear bilaterally, no wheezes, rhonci, crackles  Cardiovascular: Regular rate and rhythm. No murmurs, gallops or rubs.  Abdomen:Soft. Bowel sounds present. Non-tender.  Extremities: No lower extremity edema. Pulses are 2 + in the bilateral DP/PT.   Labs:   Lab Results   Component Value Date   WBC 9.1  07/24/2014   HGB 12.8* 07/24/2014   HCT 43.6 07/24/2014   MCV 98.2 07/24/2014   PLT 206 07/24/2014     Recent Labs Lab 07/24/14 0501  NA 146  K 4.1  CL 100  CO2 37*  BUN 30*  CREATININE 1.63*  CALCIUM 9.4  PROT 7.8  BILITOT 0.6  ALKPHOS 45  ALT 143*  AST 101*  GLUCOSE 100*   Lab Results  Component Value Date             TROPONINI <0.30 07/24/2014    Echo 07/24/14: Left ventricle: The cavity size was normal. There was mild concentric hypertrophy with moderate focal basal hypertrophy. Systolic function was mildly reduced. The estimated ejection fraction was in the range of 45% to 50%. There is akinesis of the lateral myocardium. Doppler parameters are consistent with abnormal left ventricular relaxation (grade 1 diastolic dysfunction). - Aortic valve: Cusp separation was reduced. There was mild stenosis. - Mitral valve: There was mild regurgitation. - Left atrium: The atrium was mildly dilated. - Right ventricle: The cavity size was mildly dilated. Wall thickness was normal. - Right atrium: The atrium was mildly dilated. - Pulmonary arteries: Systolic pressure was severely increased. PA peak pressure: 75 mm Hg (S).  EKG: sinus, RBBB, LAFB  ASSESSMENT AND PLAN:   1. Acute diastolic and systolic CHF: He has diuresed well with IV Lasix. Net negative 3 liters since admission. Seems to be near euvolemia.Would change to po Lasix tomorrow.    2. Cardiomyopathy: LVEF=45-50% with lateral wall akinesis. High probability of obstructive CAD given long term use of tobacco and known PAD, carotid artery disease. He is having no chest pain. Prefers not to pursue invasive evaluation and I think that is reasonable, especially given advanced stage lung cancer for which he is refusing treatment. Could pursue stress test as outpatient if he has any chest pain. Would continue beta blocker and Ace-inh. Will likely need Lasix 40 mg po  Qdaily at discharge.    Signed: Lauree Chandler, MD 07/25/2014, 6:10 PM

## 2014-07-25 NOTE — Progress Notes (Signed)
[facility] PROGRESS NOTE Interim History: 80 y/o male who presented to ED with increase SOB. Found to have CAP and vascular congestion suggesting acute CHF. No prior history of CHF per records. BNP 14,393. Patient admitted for treatment of SOB due to CHF and CAP.  Filed Weights   07/23/14 2207 07/24/14 0449 07/25/14 0700  Weight: 85.7 kg (188 lb 15 oz) 83.734 kg (184 lb 9.6 oz) 82.4 kg (181 lb 10.5 oz)        Intake/Output Summary (Last 24 hours) at 07/25/14 1424 Last data filed at 07/25/14 1100  Gross per 24 hour  Intake    540 ml  Output   1000 ml  Net   -460 ml     Assessment/Plan: acute on chronic diastolic CHF (congestive heart failure): -will follow 2D echo : Showing EF 45-50 percent, grade 1 diastolic dysfunction, with lateral wall akinesis ( normal nuclear medicine stress test on 12/2010) -troponin neg and currently no complaining of CP -will continue low sodium diet, strict I's and O's and daily weights -will continue IV diuretics and will start flutter valve - Net ins and outs since admission Admission -3000 ml , lost 3 kg.  Community acquired pneumonia: continue current antibiotics -will start pulmicort -PRN oxygen supplementation -start flutter valve -follow clinical response  -continue IV rocephin and zithromax  Abdominal discomfort: abd US demonstrating cholelithiasis w/o cholecystitis; otherwise unremarkable -lipase slightly elevated -patient reports no abd pain, no further nausea  -will continue full liquid diet for now; that will provide bowel rest as well, will advance in a.m.Marland Kitchen -continue PRN antiemetics and PRN analgesics     COPD (chronic obstructive pulmonary disease): overall stable to slightly wheezing -will start pulmicort and continue PRN albuterol -started on flutter valve -continue PRN oxygen supplementation   hypertension:  - stable overall -will follow VS and adjust medications as needed   Code Status: Full Family Communication:  Wife at ebdside Disposition Plan: Remains on telemetry   Consultants:  Cardiology  Procedures: ECHO: done 07/24/14, EF 45-50 percent, lateral wall akinesis, grade 1 diastolic dysfunction  Antibiotics:  Rocephin 12/7  Zithromax 12/7  HPI/Subjective: Afebrile, No Acute distress; denies fever, CP, nausea, vomiting or any other complaints. Still mild SOB, but much better than on admission.  Objective: Filed Vitals:   07/25/14 2297 07/25/14 0651 07/25/14 0700 07/25/14 0746  BP: 125/59     Pulse: 76     Temp: 97.9 F (36.6 C)     TempSrc: Oral     Resp: 25     Height:      Weight:   82.4 kg (181 lb 10.5 oz)   SpO2: 72% 93%  96%     Exam:  General: Alert, awake, oriented x3,feeling better already; but still with some SOB and orthopnea HEENT: No bruits, no goiter. Positive mild JVD Heart: no rubs or gallops, S1 and S2 appreciated on exam; trace to 1+ edema LE bilaterally Lungs: slight exp wheezing, scattered rhonchi and positive bibasilar crackles Abdomen: Soft, nontender, nondistended, positive bowel sounds.  Neuro: Grossly intact, nonfocal.   Data Reviewed: Basic Metabolic Panel:  Recent Labs Lab 07/23/14 1918 07/24/14 0501  NA 145 146  K 4.5 4.1  CL 105 100  CO2 28 37*  GLUCOSE 94 100*  BUN 33* 30*  CREATININE 1.48* 1.63*  CALCIUM 9.4 9.4   Liver Function Tests:  Recent Labs Lab 07/23/14 1918 07/24/14 0501  AST 129* 101*  ALT 152* 143*  ALKPHOS 43 45  BILITOT 0.5  0.6  PROT 7.5 7.8  ALBUMIN 3.0* 3.1*    Recent Labs Lab 07/24/14 0501 07/25/14 0845  LIPASE 103* 69*   CBC:  Recent Labs Lab 07/23/14 1918 07/24/14 0501  WBC 7.0 9.1  NEUTROABS  --  6.3  HGB 12.1* 12.8*  HCT 41.1 43.6  MCV 96.9 98.2  PLT 178 206   Cardiac Enzymes:  Recent Labs Lab 07/24/14 0501 07/24/14 1054 07/24/14 1642  TROPONINI <0.30 <0.30 <0.30   BNP (last 3 results)  Recent Labs  07/23/14 1918  PROBNP 14393.0*    Recent Results (from the past 240  hour(s))  MRSA PCR Screening     Status: Abnormal   Collection Time: 07/23/14 11:05 PM  Result Value Ref Range Status   MRSA by PCR POSITIVE (A) NEGATIVE Final    Comment:        The GeneXpert MRSA Assay (FDA approved for NASAL specimens only), is one component of a comprehensive MRSA colonization surveillance program. It is not intended to diagnose MRSA infection nor to guide or monitor treatment for MRSA infections. RESULT CALLED TO, READ BACK BY AND VERIFIED WITH: Wynelle Cleveland [id number] @ 0410 BY J SCOTTON      Studies: Dg Chest 2 View  07/23/2014   CLINICAL DATA:  Cough, congestion, shortness of breath and weakness for several days, personal history of COPD, smoking, stroke, hypertension  EXAM: CHEST  2 VIEW  COMPARISON:  09/21/2013  FINDINGS: Enlargement of cardiac silhouette with pulmonary vascular congestion.  Mediastinal contour stable.  Atherosclerotic calcification aorta.  COPD changes with bibasilar atelectasis versus infiltrate.  No pleural effusion or pneumothorax.  Abdominal aortic endo stent.  Osseous demineralization with broad-based dextro convex thoracic scoliosis and degenerative disc disease changes.  IMPRESSION: Enlargement of cardiac silhouette with pulmonary vascular congestion.  Mild bibasilar atelectasis versus infiltrate.   Electronically Signed   By: Lavonia Dana M.D.   On: 07/23/2014 19:15   US Abdomen Complete  07/24/2014   CLINICAL DATA:  80 year old male with abdominal discomfort x2 days  EXAM: ULTRASOUND ABDOMEN COMPLETE  COMPARISON:  Correlation with the CT from 11/01/2013  FINDINGS: Gallbladder: Multiple layering gallstones are present. The gallbladder wall measures approximately 4 mm. The sonographic Percell Miller sign is negative. There is no pericholecystic fluid  Common bile duct: Diameter: Is normal in size and measures approximately 4 mm  Liver: No focal lesion identified. Within normal limits in parenchymal echogenicity.  IVC: No abnormality visualized.   Pancreas: The pancreas is poorly visualized.  Spleen: Size and appearance within normal limits.  Right Kidney: Length: 10.5 cm. Echogenicity within normal limits. No mass or hydronephrosis visualized. There is no nephrolithiasis.  Left Kidney: Length: 11.5 cm. Echogenicity within normal limits. No mass or hydronephrosis visualized. There is no nephrolithiasis.  Abdominal aorta: The abdominal aorta measures up to 2.7 cm along its mid aspect.  Other findings: None.  IMPRESSION: Cholelithiasis without evidence for acute cholecystitis.   Electronically Signed   By: Rosemarie Ax   On: 07/24/2014 08:32    Scheduled Meds: . azithromycin  500 mg Intravenous Q24H  . budesonide (PULMICORT) nebulizer solution  0.25 mg Nebulization BID  . captopril  50 mg Oral BID  . cefTRIAXone (ROCEPHIN)  IV  1 g Intravenous Q24H  . Chlorhexidine Gluconate Cloth  6 each Topical Q0600  . enoxaparin (LOVENOX) injection  40 mg Subcutaneous QHS  . fenofibrate  160 mg Oral Daily  . furosemide  40 mg Intravenous Daily  . metoprolol  100 mg Oral  Daily  . mupirocin ointment  1 application Nasal BID  . pantoprazole  40 mg Oral Daily  . sodium chloride  3 mL Intravenous Q12H  . sodium chloride  3 mL Intravenous Q12H   Continuous Infusions:   Time: 30 minutes (> 50% of time dedicated to coordinate care, face to face examination and to educate patient and daughter on condition and treatment; counseling of what type of diet to follow and importance of daily weights)  [facility], [place] [facility] Pager [phone]. If 8PM-8AM, please contact night-coverage at [email or web address], password [facility] 07/25/2014, 2:24 PM  LOS: 2 days

## 2014-07-26 LAB — BASIC METABOLIC PANEL
Anion gap: 9 (ref 5–15)
BUN: 27 mg/dL — AB (ref 6–23)
CALCIUM: 9.5 mg/dL (ref 8.4–10.5)
CHLORIDE: 90 meq/L — AB (ref 96–112)
CO2: 39 mEq/L — ABNORMAL HIGH (ref 19–32)
Creatinine, Ser: 1.25 mg/dL (ref 0.50–1.35)
GFR calc non Af Amer: 53 mL/min — ABNORMAL LOW (ref >90)
GFR, EST AFRICAN AMERICAN: 61 mL/min — AB (ref >90)
Glucose, Bld: 137 mg/dL — ABNORMAL HIGH (ref 70–99)
Potassium: 3.8 mEq/L (ref 3.7–5.3)
Sodium: 138 mEq/L (ref 137–147)

## 2014-07-26 LAB — LEGIONELLA ANTIGEN, URINE

## 2014-07-26 LAB — LIPASE, BLOOD: Lipase: 58 U/L (ref 11–59)

## 2014-07-26 MED ORDER — LORAZEPAM 2 MG/ML IJ SOLN: 1.0000 mg | Freq: Once | INTRAMUSCULAR | Status: AC | PRN

## 2014-07-26 MED ORDER — FUROSEMIDE 40 MG PO TABS
40.0000 mg | ORAL_TABLET | Freq: Every day | ORAL | Status: DC
Start: 2014-07-26 — End: 2014-07-27
  Administered 2014-07-26 – 2014-07-27 (×2): 40 mg via ORAL
  Filled 2014-07-26 (×2): qty 1

## 2014-07-26 NOTE — Progress Notes (Addendum)
SATURATION QUALIFICATIONS: (This note is used to comply with regulatory documentation for home oxygen)  Patient Saturations on Room Air at Rest = 91%  Patient Saturations on Room Air while Ambulating = 85-87% Initially patient's oxygen saturation was 90-91% but after a few minutes ambulating patient stated he felt a little SOB and his oxygen levels went to 85% and stayed between 85-88%   Patient Saturations on 2 Liters of oxygen while Ambulating = 91%  Please briefly explain why patient needs home oxygen:  Patient's oxygen levels dropped to mid 80s while ambulating on RA.    Patient has poor circulation in hands. Wife states patient has Raynaud's disease. Oxygen saturation levels fluctuating frequently when assessed with his hands.  Oxygen saturation able to be obtained with probe on patient's forehead.

## 2014-07-26 NOTE — Progress Notes (Signed)
[facility] PROGRESS NOTE Interim History: 80 y/o male who presented to ED with increase SOB. Found to have CAP and vascular congestion suggesting acute CHF. No prior history of CHF per records. BNP 14,393. Patient admitted for treatment of SOB due to CHF and CAP.  Filed Weights   07/24/14 0449 07/25/14 0700 07/26/14 0503  Weight: 83.734 kg (184 lb 9.6 oz) 82.4 kg (181 lb 10.5 oz) 82.2 kg (181 lb 3.5 oz)        Intake/Output Summary (Last 24 hours) at 07/26/14 1507 Last data filed at 07/26/14 1148  Gross per 24 hour  Intake      0 ml  Output    950 ml  Net   -950 ml     Assessment/Plan: acute on chronic diastolic CHF (congestive heart failure): -will follow 2D echo : Showing EF 45-50 percent, grade 1 diastolic dysfunction, with lateral wall akinesis ( normal nuclear medicine stress test on 12/2010), cardiology consult appreciated, no further workup is indicated at this point . -troponin neg and currently no complaining of CP -will continue low sodium diet, strict I's and O's and daily weights - Net ins and outs since admission Admission -3400 ml , lost 3 kg. - Will change IV to be all Lasix today patient of discharge tomorrow.  Community acquired pneumonia: continue current antibiotics -will start pulmicort -PRN oxygen supplementation -start flutter valve -follow clinical response  -continue IV rocephin and zithroma  Acute respiratory failure -Appears to be multifactorial, mainly related to acute on chronic diastolic CHF, with pneumonia, with baseline pneumonia, with known history of pulmonary mass, patient will be discharged on home oxygen.  Pulmonary mass -This is on previous CT done earlier this year, patient know it is very likely to be  cancer, but he does not wish for any further workup or treatment .  Abdominal discomfort: abd US demonstrating cholelithiasis w/o cholecystitis; otherwise unremarkable -lipase slightly elevated -patient reports no abd pain, no  further nausea  -will continue full liquid diet for now; that will provide bowel rest as well, will advance in a.m.Marland Kitchen -continue PRN antiemetics and PRN analgesics     COPD (chronic obstructive pulmonary disease): overall stable to slightly wheezing -will start pulmicort and continue PRN albuterol -started on flutter valve -continue PRN oxygen supplementation   hypertension:  - stable overall -will follow VS and adjust medications as needed   Code Status: Full Family Communication: Wife at ebdside Disposition Plan: Remains on telemetry   Consultants:  Cardiology  Procedures: ECHO: done 07/24/14, EF 45-50 percent, lateral wall akinesis, grade 1 diastolic dysfunction  Antibiotics:  Rocephin 12/7  Zithromax 12/7  HPI/Subjective: Afebrile, No Acute distress; denies fever, CP, nausea, vomiting or any other complaints. Still mild SOB, but much better than on admission.  Objective: Filed Vitals:   07/25/14 1925 07/25/14 2249 07/26/14 0503 07/26/14 0755  BP:  141/63 144/63   Pulse:  70 74   Temp:  98.4 F (36.9 C) 97.1 F (36.2 C)   TempSrc:  Oral Oral   Resp:  18 20   Height:      Weight:   82.2 kg (181 lb 3.5 oz)   SpO2: 96% 97% 95% 96%     Exam:  General: Alert, awake, oriented x3,feeling better already; but still with some SOB and orthopnea HEENT: No bruits, no goiter. Positive mild JVD Heart: no rubs or gallops, S1 and S2 appreciated on exam; trace to 1+ edema LE bilaterally Lungs: slight exp wheezing, scattered rhonchi and  positive bibasilar crackles Abdomen: Soft, nontender, nondistended, positive bowel sounds.  Neuro: Grossly intact, nonfocal.   Data Reviewed: Basic Metabolic Panel:  Recent Labs Lab 07/23/14 1918 07/24/14 0501 07/26/14 0405  NA 145 146 138  K 4.5 4.1 3.8  CL 105 100 90*  CO2 28 37* 39*  GLUCOSE 94 100* 137*  BUN 33* 30* 27*  CREATININE 1.48* 1.63* 1.25  CALCIUM 9.4 9.4 9.5   Liver Function Tests:  Recent Labs Lab  07/23/14 1918 07/24/14 0501  AST 129* 101*  ALT 152* 143*  ALKPHOS 43 45  BILITOT 0.5 0.6  PROT 7.5 7.8  ALBUMIN 3.0* 3.1*    Recent Labs Lab 07/24/14 0501 07/25/14 0845 07/26/14 0405  LIPASE 103* 69* 58   CBC:  Recent Labs Lab 07/23/14 1918 07/24/14 0501  WBC 7.0 9.1  NEUTROABS  --  6.3  HGB 12.1* 12.8*  HCT 41.1 43.6  MCV 96.9 98.2  PLT 178 206   Cardiac Enzymes:  Recent Labs Lab 07/24/14 0501 07/24/14 1054 07/24/14 1642  TROPONINI <0.30 <0.30 <0.30   BNP (last 3 results)  Recent Labs  07/23/14 1918  PROBNP 14393.0*    Recent Results (from the past 240 hour(s))  MRSA PCR Screening     Status: Abnormal   Collection Time: 07/23/14 11:05 PM  Result Value Ref Range Status   MRSA by PCR POSITIVE (A) NEGATIVE Final    Comment:        The GeneXpert MRSA Assay (FDA approved for NASAL specimens only), is one component of a comprehensive MRSA colonization surveillance program. It is not intended to diagnose MRSA infection nor to guide or monitor treatment for MRSA infections. RESULT CALLED TO, READ BACK BY AND VERIFIED WITH: DAWN BROWN,RN [id number] @ 0410 BY J SCOTTON      Studies: No results found.  Scheduled Meds: . azithromycin  500 mg Intravenous Q24H  . budesonide (PULMICORT) nebulizer solution  0.25 mg Nebulization BID  . captopril  50 mg Oral BID  . cefTRIAXone (ROCEPHIN)  IV  1 g Intravenous Q24H  . Chlorhexidine Gluconate Cloth  6 each Topical Q0600  . enoxaparin (LOVENOX) injection  40 mg Subcutaneous QHS  . fenofibrate  160 mg Oral Daily  . furosemide  40 mg Intravenous Daily  . metoprolol  100 mg Oral Daily  . mupirocin ointment  1 application Nasal BID  . pantoprazole  40 mg Oral Daily  . sodium chloride  3 mL Intravenous Q12H  . sodium chloride  3 mL Intravenous Q12H   Continuous Infusions:   Time: 25 minutes (> 50% of time dedicated to coordinate care, face to face examination and to educate patient and daughter on condition  and treatment; counseling of what type of diet to follow and importance of daily weights)  [facility], [place] [facility] Pager [phone]. If 8PM-8AM, please contact night-coverage at [email or web address], password [facility] 07/26/2014, 3:07 PM  LOS: 3 days

## 2014-07-26 NOTE — Progress Notes (Signed)
Walked by patient's room and found patient up without wearing his oxygen with his wife about to walk in the hall.  I told the patient and his wife that it was fine if the patient walked but he needed to wear his oxygen in the hall since his oxygen levels decreased to the mid 80s on RA.  Wife became very agitated stating that patient "needed his IV out this instant and that she could take him out of this hospital any minute." I instructed the patient's wife that he needed his IV for his IV antibiotics.  Wife became even more agitated and stated that she was going to leave.  After wife left, I educated the patient on why he needed to wear the oxygen and his IV again. Patient in bed with bed alarm on wearing oxygen at 2 L/M [place].  Will continue to monitor.

## 2014-07-26 NOTE — Progress Notes (Signed)
Patient woke up confused. Patient got up out of bed. Staff reoriented the patient. Patient had a snack and kept saying "I'm mixed up". Then patient telephoned his spouse at 22 (present time). RN spoke to spouse.

## 2014-07-26 NOTE — Progress Notes (Signed)
Patient ID: [name], male   DOB: 10-29-1932, 80 y.o.   MRN: [id number]  Please refer to complete cardiology note by Dr. Angelena Form yesterday, July 25, 2014.  [name], MD

## 2014-07-27 DIAGNOSIS — I5040 Unspecified combined systolic (congestive) and diastolic (congestive) heart failure: Secondary | ICD-10-CM

## 2014-07-27 MED ORDER — ZOLPIDEM TARTRATE 5 MG PO TABS: 5.0000 mg | ORAL_TABLET | Freq: Every evening | ORAL | Status: DC | PRN

## 2014-07-27 MED ORDER — LEVOFLOXACIN 500 MG PO TABS: 500.0000 mg | ORAL_TABLET | Freq: Every day | ORAL | Status: AC

## 2014-07-27 MED ORDER — LORAZEPAM 2 MG/ML IJ SOLN
1.0000 mg | Freq: Once | INTRAMUSCULAR | Status: AC | PRN
  Administered 2014-07-27: 1 mg via INTRAVENOUS
  Filled 2014-07-27: qty 1

## 2014-07-27 MED ORDER — FUROSEMIDE 40 MG PO TABS: 40.0000 mg | ORAL_TABLET | Freq: Every day | ORAL | Status: DC

## 2014-07-27 NOTE — Progress Notes (Signed)
[facility] is providing the following services: Home 02  If patient discharges after hours, please call [phone].   Linward Headland 07/27/2014, 10:52 AM

## 2014-07-27 NOTE — Discharge Instructions (Signed)
Follow with Primary MD Perry Stain, MD in 7 days   Get CBC, CMP, 2 view Chest X ray checked  by Primary MD next visit.    Activity: As tolerated with Full fall precautions use walker/cane & assistance as needed   Disposition Home with home health   Diet: Heart Healthy  , with feeding assistance and aspiration precautions as needed.  For Heart failure patients - Check your Weight same time everyday, if you gain over 2 pounds, or you develop in leg swelling, experience more shortness of breath or chest pain, call your Primary MD immediately. Follow Cardiac Low Salt Diet and 1.8 lit/day fluid restriction.   On your next visit with your primary care physician please Get Medicines reviewed and adjusted.   Please request your Prim.MD to go over all Hospital Tests and Procedure/Radiological results at the follow up, please get all Hospital records sent to your Prim MD by signing hospital release before you go home.   If you experience worsening of your admission symptoms, develop shortness of breath, life threatening emergency, suicidal or homicidal thoughts you must seek medical attention immediately by calling 911 or calling your MD immediately  if symptoms less severe.  You Must read complete instructions/literature along with all the possible adverse reactions/side effects for all the Medicines you take and that have been prescribed to you. Take any new Medicines after you have completely understood and accpet all the possible adverse reactions/side effects.   Do not drive, operating heavy machinery, perform activities at heights, swimming or participation in water activities or provide baby sitting services if your were admitted for syncope or siezures until you have seen by Primary MD or a Neurologist and advised to do so again.  Do not drive when taking Pain medications.    Do not take more than prescribed Pain, Sleep and Anxiety Medications  Special Instructions: If you have  smoked or chewed Tobacco  in the last 2 yrs please stop smoking, stop any regular Alcohol  and or any Recreational drug use.  Wear Seat belts while driving.   Please note  You were cared for by a hospitalist during your hospital stay. If you have any questions about your discharge medications or the care you received while you were in the hospital after you are discharged, you can call the unit and asked to speak with the hospitalist on call if the hospitalist that took care of you is not available. Once you are discharged, your primary care physician will handle any further medical issues. Please note that NO REFILLS for any discharge medications will be authorized once you are discharged, as it is imperative that you return to your primary care physician (or establish a relationship with a primary care physician if you do not have one) for your aftercare needs so that they can reassess your need for medications and monitor your lab values.   Metoprolol tablets What is this medicine? METOPROLOL (me TOE proe lole) is a beta-blocker. Beta-blockers reduce the workload on the heart and help it to beat more regularly. This medicine is used to treat high blood pressure and to prevent chest pain. It is also used to after a heart attack and to prevent an additional heart attack from occurring. This medicine may be used for other purposes; ask your health care provider or pharmacist if you have questions. COMMON BRAND NAME(S): Lopressor What should I tell my health care provider before I take this medicine? They need to know if you  have any of these conditions: -diabetes -heart or vessel disease like slow heart rate, worsening heart failure, heart block, sick sinus syndrome or Raynaud's disease -kidney disease -liver disease -lung or breathing disease, like asthma or emphysema -pheochromocytoma -thyroid disease -an unusual or allergic reaction to metoprolol, other beta-blockers, medicines, foods,  dyes, or preservatives -pregnant or trying to get pregnant -breast-feeding How should I use this medicine? Take this medicine by mouth with a drink of water. Follow the directions on the prescription label. Take this medicine immediately after meals. Take your doses at regular intervals. Do not take more medicine than directed. Do not stop taking this medicine suddenly. This could lead to serious heart-related effects. Talk to your pediatrician regarding the use of this medicine in children. Special care may be needed. Overdosage: If you think you have taken too much of this medicine contact a poison control center or emergency room at once. NOTE: This medicine is only for you. Do not share this medicine with others. What if I miss a dose? If you miss a dose, take it as soon as you can. If it is almost time for your next dose, take only that dose. Do not take double or extra doses. What may interact with this medicine? This medicine may interact with the following medications: -certain medicines for blood pressure, heart disease, irregular heart beat -certain medicines for depression like monoamine oxidase (MAO) inhibitors, fluoxetine, or paroxetine -clonidine -dobutamine -epinephrine -isoproterenol -reserpine This list may not describe all possible interactions. Give your health care provider a list of all the medicines, herbs, non-prescription drugs, or dietary supplements you use. Also tell them if you smoke, drink alcohol, or use illegal drugs. Some items may interact with your medicine. What should I watch for while using this medicine? Visit your doctor or health care professional for regular check ups. Contact your doctor right away if your symptoms worsen. Check your blood pressure and pulse rate regularly. Ask your health care professional what your blood pressure and pulse rate should be, and when you should contact them. You may get drowsy or dizzy. Do not drive, use machinery, or do  anything that needs mental alertness until you know how this medicine affects you. Do not sit or stand up quickly, especially if you are an older patient. This reduces the risk of dizzy or fainting spells. Contact your doctor if these symptoms continue. Alcohol may interfere with the effect of this medicine. Avoid alcoholic drinks. What side effects may I notice from receiving this medicine? Side effects that you should report to your doctor or health care professional as soon as possible: -allergic reactions like skin rash, itching or hives -cold or numb hands or feet -depression -difficulty breathing -faint -fever with sore throat -irregular heartbeat, chest pain -rapid weight gain -swollen legs or ankles Side effects that usually do not require medical attention (report to your doctor or health care professional if they continue or are bothersome): -anxiety or nervousness -change in sex drive or performance -dry skin -headache -nightmares or trouble sleeping -short term memory loss -stomach upset or diarrhea -unusually tired This list may not describe all possible side effects. Call your doctor for medical advice about side effects. You may report side effects to FDA at 1-800-FDA-1088. Where should I keep my medicine? Keep out of the reach of children. Store at room temperature between 15 and 30 degrees C (59 and 86 degrees F). Throw away any unused medicine after the expiration date. NOTE: This sheet is a  summary. It may not cover all possible information. If you have questions about this medicine, talk to your doctor, pharmacist, or health care provider.  2015, Else[employer]/Gold Standard. (2013-04-07 14:40:36)      Furosemide tablets What is this medicine? FUROSEMIDE (fyoor [place] se mide) is a diuretic. It helps you make more urine and to lose salt and excess water from your body. This medicine is used to treat high blood pressure, and edema or swelling from heart, kidney, or liver  disease. This medicine may be used for other purposes; ask your health care provider or pharmacist if you have questions. COMMON BRAND NAME(S): Delone, Lasix What should I tell my health care provider before I take this medicine? They need to know if you have any of these conditions: -abnormal blood electrolytes -diarrhea or vomiting -gout -heart disease -kidney disease, small amounts of urine, or difficulty passing urine -liver disease -an unusual or allergic reaction to furosemide, sulfa drugs, other medicines, foods, dyes, or preservatives -pregnant or trying to get pregnant -breast-feeding How should I use this medicine? Take this medicine by mouth with a glass of water. Follow the directions on the prescription label. You may take this medicine with or without food. If it upsets your stomach, take it with food or milk. Do not take your medicine more often than directed. Remember that you will need to pass more urine after taking this medicine. Do not take your medicine at a time of day that will cause you problems. Do not take at bedtime. Talk to your pediatrician regarding the use of this medicine in children. While this drug may be prescribed for selected conditions, precautions do apply. Overdosage: If you think you have taken too much of this medicine contact a poison control center or emergency room at once. NOTE: This medicine is only for you. Do not share this medicine with others. What if I miss a dose? If you miss a dose, take it as soon as you can. If it is almost time for your next dose, take only that dose. Do not take double or extra doses. What may interact with this medicine? -aspirin and aspirin-like medicines -certain antibiotics -chloral hydrate -cisplatin -cyclosporine -digoxin -diuretics -laxatives -lithium -medicines for blood pressure -medicines that relax muscles for surgery -methotrexate -NSAIDs, medicines for pain and inflammation like ibuprofen,  naproxen, or indomethacin -phenytoin -steroid medicines like prednisone or cortisone -sucralfate This list may not describe all possible interactions. Give your health care provider a list of all the medicines, herbs, non-prescription drugs, or dietary supplements you use. Also tell them if you smoke, drink alcohol, or use illegal drugs. Some items may interact with your medicine. What should I watch for while using this medicine? Visit your doctor or health care professional for regular checks on your progress. Check your blood pressure regularly. Ask your doctor or health care professional what your blood pressure should be, and when you should contact him or her. If you are a diabetic, check your blood sugar as directed. You may need to be on a special diet while taking this medicine. Check with your doctor. Also, ask how many glasses of fluid you need to drink a day. You must not get dehydrated. You may get drowsy or dizzy. Do not drive, use machinery, or do anything that needs mental alertness until you know how this drug affects you. Do not stand or sit up quickly, especially if you are an older patient. This reduces the risk of dizzy or fainting spells. Alcohol  can make you more drowsy and dizzy. Avoid alcoholic drinks. This medicine can make you more sensitive to the sun. Keep out of the sun. If you cannot avoid being in the sun, wear protective clothing and use sunscreen. Do not use sun lamps or tanning beds/booths. What side effects may I notice from receiving this medicine? Side effects that you should report to your doctor or health care professional as soon as possible: -blood in urine or stools -dry mouth -fever or chills -hearing loss or ringing in the ears -irregular heartbeat -muscle pain or weakness, cramps -skin rash -stomach upset, pain, or nausea -tingling or numbness in the hands or feet -unusually weak or tired -vomiting or diarrhea -yellowing of the eyes or skin Side  effects that usually do not require medical attention (report to your doctor or health care professional if they continue or are bothersome): -headache -loss of appetite -unusual bleeding or bruising This list may not describe all possible side effects. Call your doctor for medical advice about side effects. You may report side effects to FDA at 1-800-FDA-1088. Where should I keep my medicine? Keep out of the reach of children. Store at room temperature between 15 and 30 degrees C (59 and 86 degrees F). Protect from light. Throw away any unused medicine after the expiration date. NOTE: This sheet is a summary. It may not cover all possible information. If you have questions about this medicine, talk to your doctor, pharmacist, or health care provider.  2015, Elsevier/Gold Standard. (2009-07-22 16:24:50)      Fenofibrate (micronized or non-micronized) tablets What is this medicine? FENOFIBRATE (fen oh FYE brate) can help lower blood fats and cholesterol for people who are at risk of getting inflammation of the pancreas (pancreatitis) from having very high amounts of fats in their blood. This medicine is only for patients whose blood fats are not controlled by diet. This medicine may be used for other purposes; ask your health care provider or pharmacist if you have questions. COMMON BRAND NAME(S): Fenoglide, Judith Blonder, Triglide What should I tell my health care provider before I take this medicine? They need to know if you have any of these conditions: -gallbladder disease -heart disease -kidney disease -liver disease -an unusual or allergic reaction to fenofibrate, gemfibrozil, other medicines, foods, dyes, or preservatives -pregnant or trying to get pregnant -breast-feeding How should I use this medicine? Take this medicine by mouth with a glass of water. Follow the directions on the prescription label. Do not take chipped or broken tablets. Take your medicine at regular  intervals. Do not take it more often than directed. Do not stop taking except on your doctor's advice. Talk to your pediatrician regarding the use of this medicine in children. Special care may be needed. Overdosage: If you think you have taken too much of this medicine contact a poison control center or emergency room at once. NOTE: This medicine is only for you. Do not share this medicine with others. What if I miss a dose? If you miss a dose, take it as soon as you can. If it is almost time for your next dose, take only that dose. Do not take double or extra doses. What may interact with this medicine? Do not take this medicine with any of the following medications: -ezetimibe -statin-type cholesterol lowering drugs like atorvastatin, cerivastatin, fluvastatin, lovastatin, pravastatin, or simvastatin -red yeast rice This medicine may also interact with the following medications: -cholestyramine or colestipol -cyclosporine -warfarin This list may not describe all  possible interactions. Give your health care provider a list of all the medicines, herbs, non-prescription drugs, or dietary supplements you use. Also tell them if you smoke, drink alcohol, or use illegal drugs. Some items may interact with your medicine. What should I watch for while using this medicine? Visit your doctor or health care professional for regular checks on your progress. Your blood fats and other tests will be measured from time to time. Do not stop taking this medicine except on the advice of your doctor or health care professional. This medicine is only part of a total cholesterol-lowering program. Your health care professional or dietician can suggest a low-cholesterol and low-fat diet that will reduce your risk of getting heart and blood vessel disease. Avoid alcohol and smoking, and keep a proper exercise schedule. If you are diabetic, close regulation and monitoring of your blood sugars can help your blood fat  levels. This medicine may change the way your diabetic medication works, and sometimes will require that your dosages be adjusted. Check with your doctor or health care professional. This medicine can make you more sensitive to the sun. Keep out of the sun. If you cannot avoid being in the sun, wear protective clothing and use sunscreen. Do not use sun lamps or tanning beds/booths. What side effects may I notice from receiving this medicine? Side effects that you should report to your doctor or health care professional as soon as possible: -allergic reactions like skin rash, itching or hives, swelling of the face, lips, or tongue -dark urine -lower back or side pain -muscle pain, tenderness, or weakness -skin-bruising -stomach pain -trouble passing urine or change in the amount of urine -unusually weak or tired -yellowing of the eyes or skin Side effects that usually do not require medical attention (report to your doctor or health care professional if they continue or are bothersome): -constipation -headache -nausea This list may not describe all possible side effects. Call your doctor for medical advice about side effects. You may report side effects to FDA at 1-800-FDA-1088. Where should I keep my medicine? Keep out of the reach of children. Store the tablets in the original container at room temperature between 15 and 30 degrees C (59 and 86 degrees F). Keep container tightly closed. Throw away any unused medicine after the expiration date. NOTE: This sheet is a summary. It may not cover all possible information. If you have questions about this medicine, talk to your doctor, pharmacist, or health care provider.  2015, Elsevier/Gold Standard. (2010-05-28 13:39:05)     Captopril tablets What is this medicine? CAPTOPRIL (KAP toe pril) is an ACE inhibitor. This medicine is used to treat high blood pressure and heart failure. It is used to treat heart damage after a heart attack. It can  also slow the progression of kidney disease in diabetic patients. This medicine may be used for other purposes; ask your health care provider or pharmacist if you have questions. COMMON BRAND NAME(S): Capoten What should I tell my health care provider before I take this medicine? They need to know if you have any of these conditions: -bone marrow disease -heart or blood vessel disease -if you are on a special diet, such as a low salt diet -immune system disease like lupus or scleroderma -kidney disease -low blood pressure -previous swelling of the tongue, face, or lips with difficulty breathing, difficulty swallowing, hoarseness, or tightening of the throat -an unusual or allergic reaction to captopril, other ACE inhibitors, insect venom, foods, dyes, or  preservatives -pregnant or trying to get pregnant -breast-feeding How should I use this medicine? Take this medicine by mouth with a glass of water. Follow the directions on your prescription label. Take this medicine on an empty stomach, at least 1 hour before meals. Take your doses at regular intervals. Do not take more medicine than directed. Do not stop taking this medicine except on the advice of your doctor or health care professional. Talk to your pediatrician regarding the use of this medicine in children. Special care may be needed. Overdosage: If you think you have taken too much of this medicine contact a poison control center or emergency room at once. NOTE: This medicine is only for you. Do not share this medicine with others. What if I miss a dose? If you miss a dose, take it as soon as you can. If it is almost time for your next dose, take only that dose. Do not take double or extra doses. What may interact with this medicine? -antacids -diuretics -lithium -medicines for chest pain like nitroglycerin -medicines for high blood pressure -NSAIDs, medicines for pain and inflammation, like ibuprofen or  naproxen -over-the-counter herbal supplements like hawthorn -potassium salts or potassium supplements This list may not describe all possible interactions. Give your health care provider a list of all the medicines, herbs, non-prescription drugs, or dietary supplements you use. Also tell them if you smoke, drink alcohol, or use illegal drugs. Some items may interact with your medicine. What should I watch for while using this medicine? Visit your doctor or health care professional for regular checks on your progress. Check your blood pressure as directed. Ask your doctor or health care professional what your blood pressure should be and when you should contact him or her. Call your doctor or health care professional if you notice an irregular or fast heart beat. Women should inform their doctor if they wish to become pregnant or think they might be pregnant. There is a potential for serious side effects to an unborn child. Talk to your health care professional or pharmacist for more information. Check with your doctor or health care professional if you get an attack of severe diarrhea, nausea and vomiting, or if you sweat a lot. The loss of too much body fluid can make it dangerous for you to take this medicine. You may get drowsy or dizzy. Do not drive, use machinery, or do anything that needs mental alertness until you know how this drug affects you. Do not stand or sit up quickly, especially if you are an older patient. This reduces the risk of dizzy or fainting spells. Alcohol can make you more drowsy and dizzy. Avoid alcoholic drinks. Avoid salt substitutes unless you are told otherwise by your doctor or health care professional. Do not treat yourself for coughs, colds, or pain while you are taking this medicine without asking your doctor or health care professional for advice. Some ingredients may increase your blood pressure. What side effects may I notice from receiving this medicine? Side  effects that you should report to your doctor or health care professional as soon as possible: -allergic reactions like skin rash, itching or hives, swelling of the face, lips, or tongue -breathing problems -chest pain -dark urine -feeling faint or lightheaded, falls -fever or sore throat -irregular heart beat -pain or difficulty passing urine -redness, blistering, peeling or loosening of the skin, including inside the mouth -stomach pain with or without nausea or vomiting -unusually weak -yellowing of the eyes or skin  Side effects that usually do not require medical attention (report to your doctor or health care professional if they continue or are bothersome): -change in sex drive or performance -cough -loss of taste -sun sensitivity -tiredness This list may not describe all possible side effects. Call your doctor for medical advice about side effects. You may report side effects to FDA at 1-800-FDA-1088. Where should I keep my medicine? Keep out of the reach of children. Store at room temperature below 30 degrees C (86 degrees F). Protect from moisture. Keep container tightly closed. Throw away any unused medicine after the expiration date. NOTE: This sheet is a summary. It may not cover all possible information. If you have questions about this medicine, talk to your doctor, pharmacist, or health care provider.  2015, [employer] Standard. (2007-11-08 15:16:03) Heart Failure Heart failure is a condition in which the heart has trouble pumping blood. This means your heart does not pump blood efficiently for your body to work well. In some cases of heart failure, fluid may back up into your lungs or you may have swelling (edema) in your lower legs. Heart failure is usually a long-term (chronic) condition. It is important for you to take good care of yourself and follow your health care provider's treatment plan. CAUSES  Some health conditions can cause heart failure. Those health  conditions include:  High blood pressure (hypertension). Hypertension causes the heart muscle to work harder than normal. When pressure in the blood vessels is high, the heart needs to pump (contract) with more force in order to circulate blood throughout the body. High blood pressure eventually causes the heart to become stiff and weak.  Coronary artery disease (CAD). CAD is the buildup of cholesterol and fat (plaque) in the arteries of the heart. The blockage in the arteries deprives the heart muscle of oxygen and blood. This can cause chest pain and may lead to a heart attack. High blood pressure can also contribute to CAD.  Heart attack (myocardial infarction). A heart attack occurs when one or more arteries in the heart become blocked. The loss of oxygen damages the muscle tissue of the heart. When this happens, part of the heart muscle dies. The injured tissue does not contract as well and weakens the heart's ability to pump blood.  Abnormal heart valves. When the heart valves do not open and close properly, it can cause heart failure. This makes the heart muscle pump harder to keep the blood flowing.  Heart muscle disease (cardiomyopathy or myocarditis). Heart muscle disease is damage to the heart muscle from a variety of causes. These can include drug or alcohol abuse, infections, or unknown reasons. These can increase the risk of heart failure.  Lung disease. Lung disease makes the heart work harder because the lungs do not work properly. This can cause a strain on the heart, leading it to fail.  Diabetes. Diabetes increases the risk of heart failure. High blood sugar contributes to high fat (lipid) levels in the blood. Diabetes can also cause slow damage to tiny blood vessels that carry important nutrients to the heart muscle. When the heart does not get enough oxygen and food, it can cause the heart to become weak and stiff. This leads to a heart that does not contract efficiently.  Other  conditions can contribute to heart failure. These include abnormal heart rhythms, thyroid problems, and low blood counts (anemia). Certain unhealthy behaviors can increase the risk of heart failure, including:  Being overweight.  Smoking or chewing  tobacco.  Eating foods high in fat and cholesterol.  Abusing illicit drugs or alcohol.  Lacking physical activity. SYMPTOMS  Heart failure symptoms may vary and can be hard to detect. Symptoms may include:  Shortness of breath with activity, such as climbing stairs.  Persistent cough.  Swelling of the feet, ankles, legs, or abdomen.  Unexplained weight gain.  Difficulty breathing when lying flat (orthopnea).  Waking from sleep because of the need to sit up and get more air.  Rapid heartbeat.  Fatigue and loss of energy.  Feeling light-headed, dizzy, or close to fainting.  Loss of appetite.  Nausea.  Increased urination during the night (nocturia). DIAGNOSIS  A diagnosis of heart failure is based on your history, symptoms, physical examination, and diagnostic tests. Diagnostic tests for heart failure may include:  Echocardiography.  Electrocardiography.  Chest X-ray.  Blood tests.  Exercise stress test.  Cardiac angiography.  Radionuclide scans. TREATMENT  Treatment is aimed at managing the symptoms of heart failure. Medicines, behavioral changes, or surgical intervention may be necessary to treat heart failure.  Medicines to help treat heart failure may include:  Angiotensin-converting enzyme (ACE) inhibitors. This type of medicine blocks the effects of a blood protein called angiotensin-converting enzyme. ACE inhibitors relax (dilate) the blood vessels and help lower blood pressure.  Angiotensin receptor blockers (ARBs). This type of medicine blocks the actions of a blood protein called angiotensin. Angiotensin receptor blockers dilate the blood vessels and help lower blood pressure.  Water pills (diuretics).  Diuretics cause the kidneys to remove salt and water from the blood. The extra fluid is removed through urination. This loss of extra fluid lowers the volume of blood the heart pumps.  Beta blockers. These prevent the heart from beating too fast and improve heart muscle strength.  Digitalis. This increases the force of the heartbeat.  Healthy behavior changes include:  Obtaining and maintaining a healthy weight.  Stopping smoking or chewing tobacco.  Eating heart-healthy foods.  Limiting or avoiding alcohol.  Stopping illicit drug use.  Physical activity as directed by your health care provider.  Surgical treatment for heart failure may include:  A procedure to open blocked arteries, repair damaged heart valves, or remove damaged heart muscle tissue.  A pacemaker to improve heart muscle function and control certain abnormal heart rhythms.  An internal cardioverter defibrillator to treat certain serious abnormal heart rhythms.  A left ventricular assist device (LVAD) to assist the pumping ability of the heart. HOME CARE INSTRUCTIONS   Take medicines only as directed by your health care provider. Medicines are important in reducing the workload of your heart, slowing the progression of heart failure, and improving your symptoms.  Do not stop taking your medicine unless directed by your health care provider.  Do not skip any dose of medicine.  Refill your prescriptions before you run out of medicine. Your medicines are needed every day.  Engage in moderate physical activity if directed by your health care provider. Moderate physical activity can benefit some people. The elderly and people with severe heart failure should consult with a health care provider for physical activity recommendations.  Eat heart-healthy foods. Food choices should be free of trans fat and low in saturated fat, cholesterol, and salt (sodium). Healthy choices include fresh or frozen fruits and vegetables,  fish, lean meats, legumes, fat-free or low-fat dairy products, and whole grain or high fiber foods. Talk to a dietitian to learn more about heart-healthy foods.  Limit sodium if directed by your health  care provider. Sodium restriction may reduce symptoms of heart failure in some people. Talk to a dietitian to learn more about heart-healthy seasonings.  Use healthy cooking methods. Healthy cooking methods include roasting, grilling, broiling, baking, poaching, steaming, or stir-frying. Talk to a dietitian to learn more about healthy cooking methods.  Limit fluids if directed by your health care provider. Fluid restriction may reduce symptoms of heart failure in some people.  Weigh yourself every day. Daily weights are important in the early recognition of excess fluid. You should weigh yourself every morning after you urinate and before you eat breakfast. Wear the same amount of clothing each time you weigh yourself. Record your daily weight. Provide your health care provider with your weight record.  Monitor and record your blood pressure if directed by your health care provider.  Check your pulse if directed by your health care provider.  Lose weight if directed by your health care provider. Weight loss may reduce symptoms of heart failure in some people.  Stop smoking or chewing tobacco. Nicotine makes your heart work harder by causing your blood vessels to constrict. Do not use nicotine gum or patches before talking to your health care provider.  Keep all follow-up visits as directed by your health care provider. This is important.  Limit alcohol intake to no more than 1 drink per day for nonpregnant women and 2 drinks per day for men. One drink equals 12 ounces of beer, 5 ounces of wine, or 1 ounces of hard liquor. Drinking more than that is harmful to your heart. Tell your health care provider if you drink alcohol several times a week. Talk with your health care provider about whether  alcohol is safe for you. If your heart has already been damaged by alcohol or you have severe heart failure, drinking alcohol should be stopped completely.  Stop illicit drug use.  Stay up-to-date with immunizations. It is especially important to prevent respiratory infections through current pneumococcal and influenza immunizations.  Manage other health conditions such as hypertension, diabetes, thyroid disease, or abnormal heart rhythms as directed by your health care provider.  Learn to manage stress.  Plan rest periods when fatigued.  Learn strategies to manage high temperatures. If the weather is extremely hot:  Avoid vigorous physical activity.  Use air conditioning or fans or seek a cooler location.  Avoid caffeine and alcohol.  Wear loose-fitting, lightweight, and light-colored clothing.  Learn strategies to manage cold temperatures. If the weather is extremely cold:  Avoid vigorous physical activity.  Layer clothes.  Wear mittens or gloves, a hat, and a scarf when going outside.  Avoid alcohol.  Obtain ongoing education and support as needed.  Participate in or seek rehabilitation as needed to maintain or improve independence and quality of life. SEEK MEDICAL CARE IF:   Your weight increases by 03 lb/1.4 kg in 1 day or 05 lb/2.3 kg in a week.  You have increasing shortness of breath that is unusual for you.  You are unable to participate in your usual physical activities.  You tire easily.  You cough more than normal, especially with physical activity.  You have any or more swelling in areas such as your hands, feet, ankles, or abdomen.  You are unable to sleep because it is hard to breathe.  You feel like your heart is beating fast (palpitations).  You become dizzy or light-headed upon standing up. SEEK IMMEDIATE MEDICAL CARE IF:   You have difficulty breathing.  There is a change  in mental status such as decreased alertness or difficulty with  concentration.  You have a pain or discomfort in your chest.  You have an episode of fainting (syncope). MAKE SURE YOU:   Understand these instructions.  Will watch your condition.  Will get help right away if you are not doing well or get worse. Document Released: 08/03/2005 Document Revised: 12/18/2013 Document Reviewed: 09/02/2012 [facility] Patient Information 2015 [place], [place]. This information is not intended to replace advice given to you by your health care provider. Make sure you discuss any questions you have with your health care provider.

## 2014-07-27 NOTE — Plan of Care (Signed)
Problem: Phase I Progression Outcomes Goal: Dyspnea controlled at rest (HF) Outcome: Completed/Met Date Met:  07/27/14

## 2014-07-27 NOTE — Progress Notes (Signed)
Patient ID: MARTINE TRAGESER, male   DOB: [birth year], 80 y.o.   MRN: [id number]    Patient Name: [name] Date of Encounter: 07/27/2014     Principal Problem:   CHF (congestive heart failure) Active Problems:   Essential hypertension   COPD (chronic obstructive pulmonary disease)   Community acquired pneumonia   Abdominal discomfort   Congestive heart disease   Congestive dilated cardiomyopathy    SUBJECTIVE  Patient sleeping comfortably  CURRENT MEDS . azithromycin  500 mg Intravenous Q24H  . budesonide (PULMICORT) nebulizer solution  0.25 mg Nebulization BID  . captopril  50 mg Oral BID  . cefTRIAXone (ROCEPHIN)  IV  1 g Intravenous Q24H  . Chlorhexidine Gluconate Cloth  6 each Topical Q0600  . enoxaparin (LOVENOX) injection  40 mg Subcutaneous QHS  . fenofibrate  160 mg Oral Daily  . furosemide  40 mg Oral Daily  . metoprolol  100 mg Oral Daily  . mupirocin ointment  1 application Nasal BID  . pantoprazole  40 mg Oral Daily  . sodium chloride  3 mL Intravenous Q12H  . sodium chloride  3 mL Intravenous Q12H    OBJECTIVE  Filed Vitals:   07/26/14 0755 07/26/14 2053 07/26/14 2116 07/27/14 0440  BP:  142/52  142/58  Pulse:  68  77  Temp:  97.8 F (36.6 C)  97.5 F (36.4 C)  TempSrc:    Oral  Resp:    18  Height:      Weight:      SpO2: 96% 91% 95% 93%    Intake/Output Summary (Last 24 hours) at 07/27/14 0749 Last data filed at 07/26/14 1148  Gross per 24 hour  Intake      0 ml  Output    650 ml  Net   -650 ml   Filed Weights   07/24/14 0449 07/25/14 0700 07/26/14 0503  Weight: 184 lb 9.6 oz (83.734 kg) 181 lb 10.5 oz (82.4 kg) 181 lb 3.5 oz (82.2 kg)    PHYSICAL EXAM  General: Pleasant, sleeping elderly man, NAD. Neuro: Alert and oriented X 3. Moves all extremities spontaneously. Psych: Normal affect. HEENT:  Normal  Neck: Supple without bruits or JVD. Lungs:  Resp regular and unlabored, CTA. Heart: RRR no s3, s4, or murmurs. Abdomen:  Soft, non-tender, non-distended, BS + x 4.  Extremities: No clubbing, cyanosis or edema. DP/PT/Radials 2+ and equal bilaterally.  Accessory Clinical Findings  CBC No results for input(s): WBC, NEUTROABS, HGB, HCT, MCV, PLT in the last 72 hours. Basic Metabolic Panel  Recent Labs  07/26/14 0405  NA 138  K 3.8  CL 90*  CO2 39*  GLUCOSE 137*  BUN 27*  CREATININE 1.25  CALCIUM 9.5   Liver Function Tests No results for input(s): AST, ALT, ALKPHOS, BILITOT, PROT, ALBUMIN in the last 72 hours.  Recent Labs  07/25/14 0845 07/26/14 0405  LIPASE 69* 58   Cardiac Enzymes  Recent Labs  07/24/14 1054 07/24/14 1642  TROPONINI <0.30 <0.30   BNP Invalid input(s): POCBNP D-Dimer No results for input(s): DDIMER in the last 72 hours. Hemoglobin A1C No results for input(s): HGBA1C in the last 72 hours. Fasting Lipid Panel No results for input(s): CHOL, HDL, LDLCALC, TRIG, CHOLHDL, LDLDIRECT in the last 72 hours. Thyroid Function Tests No results for input(s): TSH, T4TOTAL, T3FREE, THYROIDAB in the last 72 hours.  Invalid input(s): FREET3  TELE  nsr  Radiology/Studies  Dg Chest 2 View  07/23/2014  CLINICAL DATA:  Cough, congestion, shortness of breath and weakness for several days, personal history of COPD, smoking, stroke, hypertension  EXAM: CHEST  2 VIEW  COMPARISON:  09/21/2013  FINDINGS: Enlargement of cardiac silhouette with pulmonary vascular congestion.  Mediastinal contour stable.  Atherosclerotic calcification aorta.  COPD changes with bibasilar atelectasis versus infiltrate.  No pleural effusion or pneumothorax.  Abdominal aortic endo stent.  Osseous demineralization with broad-based dextro convex thoracic scoliosis and degenerative disc disease changes.  IMPRESSION: Enlargement of cardiac silhouette with pulmonary vascular congestion.  Mild bibasilar atelectasis versus infiltrate.   Electronically Signed   By: Lavonia Dana M.D.   On: 07/23/2014 19:15   US Abdomen  Complete  07/24/2014   CLINICAL DATA:  80 year old male with abdominal discomfort x2 days  EXAM: ULTRASOUND ABDOMEN COMPLETE  COMPARISON:  Correlation with the CT from 11/01/2013  FINDINGS: Gallbladder: Multiple layering gallstones are present. The gallbladder wall measures approximately 4 mm. The sonographic Percell Miller sign is negative. There is no pericholecystic fluid  Common bile duct: Diameter: Is normal in size and measures approximately 4 mm  Liver: No focal lesion identified. Within normal limits in parenchymal echogenicity.  IVC: No abnormality visualized.  Pancreas: The pancreas is poorly visualized.  Spleen: Size and appearance within normal limits.  Right Kidney: Length: 10.5 cm. Echogenicity within normal limits. No mass or hydronephrosis visualized. There is no nephrolithiasis.  Left Kidney: Length: 11.5 cm. Echogenicity within normal limits. No mass or hydronephrosis visualized. There is no nephrolithiasis.  Abdominal aorta: The abdominal aorta measures up to 2.7 cm along its mid aspect.  Other findings: None.  IMPRESSION: Cholelithiasis without evidence for acute cholecystitis.   Electronically Signed   By: Rosemarie Ax   On: 07/24/2014 08:32    ASSESSMENT AND PLAN  1. Acute on chronic systolic/diastolic heart failure 2. Lung CAD 3. hypoxmia Rec: appears to be ok for discharge. He has oxygen desaturation and will likely need home oxygen, at least temporarily. Please call for questions.  Gregg Taylor,M.D.  07/27/2014 7:49 AM

## 2014-07-27 NOTE — Plan of Care (Signed)
Problem: Phase I Progression Outcomes Goal: EF % per last Echo/documented,Core Reminder form on chart Outcome: Completed/Met Date Met:  07/27/14 45-50%

## 2014-07-27 NOTE — Progress Notes (Signed)
ANTIBIOTIC CONSULT NOTE - Follow-Up  Pharmacy Consult for Rocephin Indication: CAP  Allergies  Allergen Reactions  . Aspirin     REACTION: GI intolerance  . Sulfonamide Derivatives     REACTION: nausea    Patient Measurements: Height: 6\' 4"  (193 cm) Weight: 181 lb 3.5 oz (82.2 kg) IBW/kg (Calculated) : 86.8   Vital Signs: Temp: 97.5 F (36.4 C) (12/11 0440) Temp Source: Oral (12/11 0440) BP: 142/58 mmHg (12/11 0440) Pulse Rate: 77 (12/11 0440) Intake/Output from previous day: 12/10 0701 - 12/11 0700 In: -  Out: 650 [Urine:650] Intake/Output from this shift:    Labs:  Recent Labs  07/26/14 0405  CREATININE 1.25   Estimated Creatinine Clearance: 55.7 mL/min (by C-G formula based on Cr of 1.25). No results for input(s): VANCOTROUGH, VANCOPEAK, VANCORANDOM, GENTTROUGH, GENTPEAK, GENTRANDOM, TOBRATROUGH, TOBRAPEAK, TOBRARND, AMIKACINPEAK, AMIKACINTROU, AMIKACIN in the last 72 hours.   Microbiology: Recent Results (from the past 720 hour(s))  MRSA PCR Screening     Status: Abnormal   Collection Time: 07/23/14 11:05 PM  Result Value Ref Range Status   MRSA by PCR POSITIVE (A) NEGATIVE Final    Comment:        The GeneXpert MRSA Assay (FDA approved for NASAL specimens only), is one component of a comprehensive MRSA colonization surveillance program. It is not intended to diagnose MRSA infection nor to guide or monitor treatment for MRSA infections. RESULT CALLED TO, READ BACK BY AND VERIFIED WITH: Wynelle Cleveland [id number] @ 0410 BY J SCOTTON     Medical History: Past Medical History  Diagnosis Date  . Hyperlipidemia   . Allergy   . COPD (chronic obstructive pulmonary disease)     via X-ray  . BPH (benign prostatic hyperplasia)     Hx of (Dr. Rosana Hoes)  . Gallstones     via CT  . Cigarette smoker   . CVA (cerebral infarction)   . Degeneration of intervertebral disc, site unspecified   . Stricture and stenosis of esophagus   . Unspecified gastritis and  gastroduodenitis without mention of hemorrhage   . Duodenitis without mention of hemorrhage   . Benign neoplasm of colon   . Diverticulosis of colon (without mention of hemorrhage)   . Abdominal aneurysm without mention of rupture     followed by [facility] (Dr. Oneida Alar)  . Gout, unspecified   . Personal history of urinary calculi   . Hyperglycemia   . Low back pain   . Nausea   . Carotid arterial disease 08/2010    on [place] 60-80% L ICA and <40% R ICA  . PAD (peripheral artery disease)   . Hypertension   . Stroke 2008    Mini Stroke    Medications:  Scheduled:  . azithromycin  500 mg Intravenous Q24H  . budesonide (PULMICORT) nebulizer solution  0.25 mg Nebulization BID  . captopril  50 mg Oral BID  . cefTRIAXone (ROCEPHIN)  IV  1 g Intravenous Q24H  . Chlorhexidine Gluconate Cloth  6 each Topical Q0600  . enoxaparin (LOVENOX) injection  40 mg Subcutaneous QHS  . fenofibrate  160 mg Oral Daily  . furosemide  40 mg Oral Daily  . metoprolol  100 mg Oral Daily  . mupirocin ointment  1 application Nasal BID  . pantoprazole  40 mg Oral Daily  . sodium chloride  3 mL Intravenous Q12H  . sodium chloride  3 mL Intravenous Q12H   Infusions:     Assessment: 80 yoM c/o SOB x several weeks.  PMH COPD, lung mass, CKD 3, AAA s/p repair. Congestion and possible infiltrates on CXR. Rocephin per Rx and Zmax per MD for CAP  12/7 >> ceftriaxone >> 12/7 >> azithromycin >>    Tmax: afebrile WBCs: wnl Renal: SCr back to baseline; CrCl 56 CG  12/8 MRSA pcr screen: positive 12/8 Urine strep/legionella: neg/neg 12/8 Hep A IgM: NR 12/8 Hep B Surface Ag: neg 12/8 Hep B C IgM: NR 12/8 HCV Ab: neg   Goal of Therapy:  Eradication of infection, appropriate dosing per indication  Plan:   Rocephin 1Gm IV q24h   F/u cultures as needed  Reuel Boom, PharmD Pager: [phone] 07/27/2014, 12:36 PM

## 2014-07-27 NOTE — Progress Notes (Signed)
Physical Therapy Treatment Patient Details Name: [name] MRN: [id number] DOB: Aug 22, 1932 Today's Date: 07/27/2014    History of Present Illness 80 yo male admitted with CHF. Hx of COPD, lung mass, HTN, AAA, CVA, low back pain.     PT Comments    Pt in bed on 2 lts O2 assisted OOB to amb to BR.  Assisted with toil toileting as pt was VERY unsteady/shaky and required increased VC's for safety.  Limited activity tolerance as pt fatigues quickly.    Follow Up Recommendations  Home health PT;Supervision - Intermittent     Equipment Recommendations   (family checking to see if they already have a RW)    Recommendations for Other Services       Precautions / Restrictions Precautions Precautions: Fall Precaution Comments: monitor sats Restrictions Weight Bearing Restrictions: No    Mobility  Bed Mobility Overal bed mobility: Needs Assistance Bed Mobility: Supine to Sit     Supine to sit: Min guard;Min assist     General bed mobility comments: increased time.  Unsteady/shaky/weak  Transfers Overall transfer level: Needs assistance Equipment used: Rolling walker (2 wheeled) Transfers: Sit to/from Stand Sit to Stand: Min assist;Mod assist         General transfer comment: 50% VC's on safety, hand placement and trun completion.  very unsteady.  Ambulation/Gait Ambulation/Gait assistance: Min assist;Mod assist Ambulation Distance (Feet): 58 Feet Assistive device: Rolling walker (2 wheeled) Gait Pattern/deviations: Step-to pattern;Step-through pattern;Staggering left;Staggering right;Drifts right/left;Narrow base of support Gait velocity: decreased   General Gait Details: very unsteady gait with x 3 LOB therapist recovered.  Lateral drift with poor self coorective reaction.  Advised family that pt should not amb on his own.  HIGH FALL RISK.    Stairs            Wheelchair Mobility    Modified Rankin (Stroke Patients Only)       Balance                                     Cognition                            Exercises      General Comments        Pertinent Vitals/Pain Pain Assessment: No/denies pain    Home Living                      Prior Function            PT Goals (current goals can now be found in the care plan section) Progress towards PT goals: Progressing toward goals    Frequency       PT Plan      Co-evaluation             End of Session Equipment Utilized During Treatment: Gait belt;Oxygen Activity Tolerance: Patient tolerated treatment well Patient left: in chair;with call bell/phone within reach;with family/visitor present     Time: 0950-1016 PT Time Calculation (min) (ACUTE ONLY): 26 min  Charges:  $Gait Training: 8-22 mins $Therapeutic Activity: 8-22 mins                    G Codes:      Rica Koyanagi  PTA [facility]  Acute  Rehab Pager      [phone]

## 2014-07-27 NOTE — Discharge Summary (Addendum)
[name], 80 y.o., DOB 18-Jul-1933, MRN [id number]. Admission date: 07/23/2014 Discharge Date 07/27/2014 Primary MD [name], MD Admitting Physician Rise Patience, MD  Admission Diagnosis  SOB (shortness of breath) [R06.02] Hypoxia [R09.02] Chronic obstructive pulmonary disease, unspecified COPD, unspecified chronic bronchitis type [J44.9] Acute congestive heart failure, unspecified congestive heart failure type [I50.9]  Discharge Diagnosis   Principal Problem:   CHF (congestive heart failure) Active Problems:   Essential hypertension   COPD (chronic obstructive pulmonary disease)   Community acquired pneumonia   Abdominal discomfort   Congestive heart disease   Congestive dilated cardiomyopathy      Past Medical History  Diagnosis Date  . Hyperlipidemia   . Allergy   . COPD (chronic obstructive pulmonary disease)     via X-ray  . BPH (benign prostatic hyperplasia)     Hx of (Dr. Rosana Hoes)  . Gallstones     via CT  . Cigarette smoker   . CVA (cerebral infarction)   . Degeneration of intervertebral disc, site unspecified   . Stricture and stenosis of esophagus   . Unspecified gastritis and gastroduodenitis without mention of hemorrhage   . Duodenitis without mention of hemorrhage   . Benign neoplasm of colon   . Diverticulosis of colon (without mention of hemorrhage)   . Abdominal aneurysm without mention of rupture     followed by [facility] (Dr. Oneida Alar)  . Gout, unspecified   . Personal history of urinary calculi   . Hyperglycemia   . Low back pain   . Nausea   . Carotid arterial disease 08/2010    on [place] 60-80% L ICA and <40% R ICA  . PAD (peripheral artery disease)   . Hypertension   . Stroke 2008    Mini Stroke    Past Surgical History  Procedure Laterality Date  . Inguinal hernia repair  03/21/2002    Bilateral  . Esophagogastroduodenoscopy  12/15/2004    Duodenitis, gastritis, esoph stricture, path neg  . Ct of abdomen  10/09/2004    5.1 cm AAA  gallstone; 7 cm. stone in the pole of the right kidney  . Ct of pelvis  10/09/2004    Normal except for divertic of bladder  . [place] of aaa  07/06/2005    Unchanged 4.9 cm. (Dr. Amedeo Plenty)  . [place] of aaa  12/24/2005    5.2 cm  . [place] of abdomen  06/24/2006    5.5 cm  . Adenosine myoview  07/13/2007    Normal  . Abdominal aortogram  07/29/2006    Infrafenal abdominal aortic aneurysm with total occulision of inferior mesenteric artery  . Abdomen ct of angio  07/22/2006    Right kidney stone, L4/5/S1 DDD; AAA 5.3 cm. gallstones  . Aaa stent graft  08/31/2006    Right artery repair -- S/P Post op CVA  . Ct of head  09/01/2006    Small vessel changes  . Pre-surgery carotid US      Mod right ICA; mild left ICA stenosis  . Mri brain  09/01/2006    Normal  . Abdominal US  11/18/2006    Normal  . Toe amputation      L 4th 2012  . Femoral-popliteal bypass graft      left, 2012  . Cataract extraction      2012, bilateral  . Eye surgery     Admission history of present illness/brief narrative: 80 yo male with history of HLD, COPD, BPH, tobacco abuse, lung cancer, stage  3 CKD, CVA, AAA, carotid artery disease, HTN admitted with SOB and found to have CHF. He is known to have advanced stage lung cancer but has refused further workup. He has been followed by pulmonary. He has been seen in the past by Dr. Martinique in our practice but is not known to have CAD. Admitted with elevated BNP and found to have community acquired pneumonia and pulmonary edema c/w CHF. He has been diuresed and is feeling much better. NO chest pain. Echo with LVEF=45-50% with lateral wall akinesis, mild AS, mild MR. Normal stress myoview May 2012. Long history of tobacco abuse with COPD.  Patient has been diuresed very well, it treated with total of 4 days of IV antibiotic for his pneumonia, still requiring home oxygen, so he'll be discharged today on home O2 and by mouth Lasix.  Hospital Course See H&P, Labs, Consult and Test reports for  all details in brief, patient was admitted for **  Principal Problem:   CHF (congestive heart failure) Active Problems:   Essential hypertension   COPD (chronic obstructive pulmonary disease)   Community acquired pneumonia   Abdominal discomfort   Congestive heart disease   Congestive dilated cardiomyopathy  acute on chronic diastolic CHF (congestive heart failure): -2D echo : Showing EF 45-50 percent, grade 1 diastolic dysfunction, with lateral wall akinesis ( normal nuclear medicine stress test on 12/2010), cardiology consulted,  no further workup is indicated at this point . -troponin neg and no complaints of CP -will need to continue low sodium diet on discharge. - Net ins and outs since admission Admission -3400 ml , lost 3 kg. - Will be discharged on Lasix 40 mg oral daily  Community acquired pneumonia: continue current antibiotics -Treated with IV rocephin and zithromax while inpatient, will continue another 4 days of oral levofloxacin as an outpatient.  Acute respiratory failure -Appears to be multifactorial, mainly related to acute on chronic diastolic CHF, with pneumonia, with baseline advanced COPD, with known history of pulmonary mass, patient will be discharged on home oxygen.  Pulmonary mass -This is on previous CT done earlier this year, patient know it is very likely to be cancer, but he does not wish for any further workup or treatment .  Abdominal discomfort: abd US demonstrating cholelithiasis w/o cholecystitis; otherwise unremarkable -lipase slightly elevated on admission, normalized prior to discharge. -patient reports no abd pain, no further nausea   COPD (chronic obstructive pulmonary disease): overall stable during this hospitalization -Resume home medication -continue oxygen supplementation   hypertension:  - stable overall, resume home medication including captopril and metoprolol.  Consultants:  Cardiology  Procedures: ECHO: done 07/24/14, EF  45-50 percent, lateral wall akinesis, grade 1 diastolic dysfunction  Antibiotics:  Rocephin 12/7-12/11  Zithromax 12/7-12/11  Dg Chest 2 View  07/23/2014   CLINICAL DATA:  Cough, congestion, shortness of breath and weakness for several days, personal history of COPD, smoking, stroke, hypertension  EXAM: CHEST  2 VIEW  COMPARISON:  09/21/2013  FINDINGS: Enlargement of cardiac silhouette with pulmonary vascular congestion.  Mediastinal contour stable.  Atherosclerotic calcification aorta.  COPD changes with bibasilar atelectasis versus infiltrate.  No pleural effusion or pneumothorax.  Abdominal aortic endo stent.  Osseous demineralization with broad-based dextro convex thoracic scoliosis and degenerative disc disease changes.  IMPRESSION: Enlargement of cardiac silhouette with pulmonary vascular congestion.  Mild bibasilar atelectasis versus infiltrate.   Electronically Signed   By: Lavonia Dana M.D.   On: 07/23/2014 19:15   US Abdomen Complete  07/24/2014  CLINICAL DATA:  80 year old male with abdominal discomfort x2 days  EXAM: ULTRASOUND ABDOMEN COMPLETE  COMPARISON:  Correlation with the CT from 11/01/2013  FINDINGS: Gallbladder: Multiple layering gallstones are present. The gallbladder wall measures approximately 4 mm. The sonographic Percell Miller sign is negative. There is no pericholecystic fluid  Common bile duct: Diameter: Is normal in size and measures approximately 4 mm  Liver: No focal lesion identified. Within normal limits in parenchymal echogenicity.  IVC: No abnormality visualized.  Pancreas: The pancreas is poorly visualized.  Spleen: Size and appearance within normal limits.  Right Kidney: Length: 10.5 cm. Echogenicity within normal limits. No mass or hydronephrosis visualized. There is no nephrolithiasis.  Left Kidney: Length: 11.5 cm. Echogenicity within normal limits. No mass or hydronephrosis visualized. There is no nephrolithiasis.  Abdominal aorta: The abdominal aorta measures up to 2.7  cm along its mid aspect.  Other findings: None.  IMPRESSION: Cholelithiasis without evidence for acute cholecystitis.   Electronically Signed   By: Rosemarie Ax   On: 07/24/2014 08:32     Today   Subjective:   [name] today has no headache,no chest abdominal pain,no new weakness tingling or numbness, feels much better today.  Objective:   Blood pressure 142/58, pulse 77, temperature 97.5 F (36.4 C), temperature source Oral, resp. rate 18, height 6\' 4"  (1.93 m), weight 82.2 kg (181 lb 3.5 oz), SpO2 93 %.  Intake/Output Summary (Last 24 hours) at 07/27/14 1110 Last data filed at 07/26/14 1148  Gross per 24 hour  Intake      0 ml  Output    650 ml  Net   -650 ml    Exam  General: Alert, awake, oriented x3, HEENT: No bruits, no goiter.  Heart: no rubs or gallops, S1 and S2 appreciated on exam;  Lungs: Good air entry, clear to auscultation Abdomen: Soft, nontender, nondistended, positive bowel sounds.  Neuro: Grossly intact, nonfocal. Data Review     CBC w Diff:  Lab Results  Component Value Date   WBC 9.1 07/24/2014   HGB 12.8* 07/24/2014   HCT 43.6 07/24/2014   PLT 206 07/24/2014   LYMPHOPCT 17 07/24/2014   MONOPCT 12 07/24/2014   EOSPCT 1 07/24/2014   BASOPCT 1 07/24/2014   CMP:  Lab Results  Component Value Date   NA 138 07/26/2014   K 3.8 07/26/2014   CL 90* 07/26/2014   CO2 39* 07/26/2014   BUN 27* 07/26/2014   CREATININE 1.25 07/26/2014   PROT 7.8 07/24/2014   ALBUMIN 3.1* 07/24/2014   BILITOT 0.6 07/24/2014   ALKPHOS 45 07/24/2014   AST 101* 07/24/2014   ALT 143* 07/24/2014  .  Micro Results Recent Results (from the past 240 hour(s))  MRSA PCR Screening     Status: Abnormal   Collection Time: 07/23/14 11:05 PM  Result Value Ref Range Status   MRSA by PCR POSITIVE (A) NEGATIVE Final    Comment:        The GeneXpert MRSA Assay (FDA approved for NASAL specimens only), is one component of a comprehensive MRSA  colonization surveillance program. It is not intended to diagnose MRSA infection nor to guide or monitor treatment for MRSA infections. RESULT CALLED TO, READ BACK BY AND VERIFIED WITH: Wynelle Cleveland [phone] @ [facility]      Discharge Instructions          Follow-up Information    Follow up with [name], MD. Call in 1 week.   Specialty:  Family Medicine   Contact information:   [place] [address] [phone]       Follow up with Lauree Chandler, MD. Call in 3 weeks.   Specialty:  Cardiology   Why:  CHF   Contact information:   [address] [place] [address] [phone]       Discharge Medications     Medication List    STOP taking these medications        OVER THE COUNTER MEDICATION      TAKE these medications        acetaminophen 325 MG tablet  Commonly known as:  TYLENOL  Take 650 mg by mouth every 6 (six) hours as needed for moderate pain (pain).     albuterol 108 (90 BASE) MCG/ACT inhaler  Commonly known as:  PROVENTIL HFA;VENTOLIN HFA  Inhale 2 puffs into the lungs every 4 (four) hours as needed for wheezing.     budesonide-formoterol 160-4.5 MCG/ACT inhaler  Commonly known as:  SYMBICORT  Inhale 2 puffs into the lungs 2 (two) times daily.     captopril 50 MG tablet  Commonly known as:  CAPOTEN  Take 1 tablet (50 mg total) by mouth 2 (two) times daily.     Coral Calcium 1000 (390 CA) MG Tabs  Take 1 tablet by mouth daily.     fenofibrate 160 MG tablet  Take 1 tablet (160 mg total) by mouth daily.     furosemide 40 MG tablet  Commonly known as:  LASIX  Take 1 tablet (40 mg total) by mouth daily.     levofloxacin 500 MG tablet  Commonly known as:  LEVAQUIN  Take 1 tablet (500 mg total) by mouth daily.     metoCLOPramide 5 MG tablet  Commonly known as:  REGLAN  Take 1 tablet (5 mg total) by mouth daily as needed.     metoprolol 100 MG tablet  Commonly known as:   LOPRESSOR  Take 1 tablet (100 mg total) by mouth daily.     pantoprazole 40 MG tablet  Commonly known as:  PROTONIX  TAKE 1 TABLET BY MOUTH DAILY     VITAMIN D PO  Take 5,000 Units by mouth daily.     vitamin E 400 UNIT capsule  Take 400 Units by mouth daily.     zolpidem 5 MG tablet  Commonly known as:  AMBIEN  Take 1 tablet (5 mg total) by mouth at bedtime as needed for sleep.         Total Time in preparing paper work, data evaluation and todays exam - 35 minutes  Adi Seales M.D on 07/27/2014 at 11:10 AM  [name]  [phone]

## 2014-08-01 ENCOUNTER — Telehealth: Payer: Self-pay

## 2014-08-01 NOTE — Telephone Encounter (Signed)
[name] left v/m; pt was d/c from [facility] 07/27/14 with CHF; [name] has been monitoring pts weight since discharge; pt has gained 5 lbs since yesterday but pt weighed this morning in jeans and belt; previously weighing in [place]. Pt having no SOB,CP,dizziness; pt does not have swelling in legs or ankles. Pt is using O2 at 2 L. Pt walked to get newspaper this morning with no problems, no SOB etc. Pt taking Lasix 40 mg daily. Pt is also having confusion since admitted to hospital; [name] noted info on Lasix may cause confusion. Today pts confusion is not as bad; pt knows where he is and who his wife is but pt has to be told as if a child when to eat, drink and brush teeth etc. Pt's wife said pt is not voiding very much. Pt is eating well but [name] said "forget about diet with no salt"; pt will not eat that food. Pt has f/u hospital appt on 08/03/14.Please advise.

## 2014-08-01 NOTE — Telephone Encounter (Signed)
Keep the f/u appointment.  Limit salt as much as possible.  Since clinically improved, wouldn't change lasix today.   Would recheck weight tomorrow and on future AMs before getting dressed and before eating breakfast.  Update [place] if he has more changes in the meantime.  Thanks.

## 2014-08-01 NOTE — Telephone Encounter (Signed)
Patient notified as instructed by telephone. Patient verbalized understanding. 

## 2014-08-03 ENCOUNTER — Encounter: Payer: Self-pay | Admitting: Family Medicine

## 2014-08-03 ENCOUNTER — Ambulatory Visit (INDEPENDENT_AMBULATORY_CARE_PROVIDER_SITE_OTHER): Payer: Medicare Other | Admitting: Family Medicine

## 2014-08-03 VITALS — BP 108/56 | HR 67 | Temp 97.9°F | Wt 179.2 lb

## 2014-08-03 DIAGNOSIS — I509 Heart failure, unspecified: Secondary | ICD-10-CM

## 2014-08-03 DIAGNOSIS — I10 Essential (primary) hypertension: Secondary | ICD-10-CM

## 2014-08-03 DIAGNOSIS — I6529 Occlusion and stenosis of unspecified carotid artery: Secondary | ICD-10-CM

## 2014-08-03 LAB — BASIC METABOLIC PANEL
BUN: 40 mg/dL — AB (ref 6–23)
CALCIUM: 9.2 mg/dL (ref 8.4–10.5)
CHLORIDE: 96 meq/L (ref 96–112)
CO2: 30 meq/L (ref 19–32)
CREATININE: 2.01 mg/dL — AB (ref 0.50–1.35)
Glucose, Bld: 112 mg/dL — ABNORMAL HIGH (ref 70–99)
Potassium: 4 mEq/L (ref 3.5–5.3)
Sodium: 139 mEq/L (ref 135–145)

## 2014-08-03 NOTE — Patient Instructions (Signed)
Go to the lab on the way out.  We'll contact you with your lab report. Try to limit salt.  Weigh daily (each AM), notify us if your baseline weight goes up or down by 3 lbs.  We'll be in touch about your lasix dose, your potassium and your kidney function.  Let me know how you are feeling in a few days- a phone call would be great.  Take care.  Glad to see you.

## 2014-08-03 NOTE — Progress Notes (Signed)
Pre visit review using our clinic review tool, if applicable. No additional management support is needed unless otherwise documented below in the visit note.  Admitted with lung mass, hypoxia, CHF and fluid overload, and COPD.  He was hallucinating as inpatient, likely from combination of acute illness, age, and change of environment.    He was treated with abx, diuresed, echo done, and here for f/u today.  D/w pt about inpatient course and CHF, cardio/renal consideration.    Breathing much improved, inc in exercise tolerance.  Weight much lower now.  Compliant with meds.  Not adding salt to foods.  Has less edema in legs and in abd per patient report.   PMH and SH reviewed  ROS: See HPI, otherwise noncontributory.  Meds, vitals, and allergies reviewed.   nad ncat A&O during the exam Mmm rrr Dec BS at [facility] at baseline but no other rales (expeted with prev LLL findings) abd soft, not ttp Ext w/o edema CN 2-12 wnl B, S/S wnl x4

## 2014-08-05 ENCOUNTER — Encounter: Payer: Self-pay | Admitting: Family Medicine

## 2014-08-05 NOTE — Assessment & Plan Note (Signed)
With mult comorbid issues going on.  He is aware of likely LLL lung cancer.  Declines w/u.  It is reasonable for patient to consider how invasive he wants to be in the w/u of cardiac issues with his known lung mass.  He'll consider this and notify me of his preferences.  He is improved clinically, d/w pt and wife about dry weight.   See notes on labs.  No change in meds.  Patient was alert and oriented during the exam, but then reported a brief red flash of light that immediately resolved.  It was in B eyes per patient.  He has no other neuro sx.  Unclear if this was related to prev hallucinations.   Given his nonfocal exam o/w, we elected not to change his meds and follow this clinically.   >25 minutes spent in face to face time with patient, >50% spent in counselling or coordination of care.

## 2014-08-06 ENCOUNTER — Telehealth: Payer: Self-pay | Admitting: Family Medicine

## 2014-08-06 NOTE — Telephone Encounter (Signed)
emmi emailed °

## 2014-08-08 ENCOUNTER — Telehealth: Payer: Self-pay | Admitting: Radiology

## 2014-08-08 ENCOUNTER — Other Ambulatory Visit (INDEPENDENT_AMBULATORY_CARE_PROVIDER_SITE_OTHER): Payer: Medicare Other

## 2014-08-08 ENCOUNTER — Telehealth: Payer: Self-pay | Admitting: Family Medicine

## 2014-08-08 ENCOUNTER — Ambulatory Visit (INDEPENDENT_AMBULATORY_CARE_PROVIDER_SITE_OTHER)
Admission: RE | Admit: 2014-08-08 | Discharge: 2014-08-08 | Disposition: A | Discharge status: Home / Self Care | Payer: Medicare Other | Source: Ambulatory Visit | Attending: Family Medicine | Admitting: Family Medicine

## 2014-08-08 DIAGNOSIS — I509 Heart failure, unspecified: Secondary | ICD-10-CM

## 2014-08-08 LAB — BASIC METABOLIC PANEL
BUN: 24 mg/dL — ABNORMAL HIGH (ref 6–23)
CO2: 27 mEq/L (ref 19–32)
CREATININE: 1.3 mg/dL (ref 0.4–1.5)
Calcium: 8.9 mg/dL (ref 8.4–10.5)
Chloride: 104 mEq/L (ref 96–112)
GFR: 57.04 mL/min — AB (ref >60.00)
Glucose, Bld: 107 mg/dL — ABNORMAL HIGH (ref 70–99)
Potassium: 3.9 mEq/L (ref 3.5–5.1)
Sodium: 137 mEq/L (ref 135–145)

## 2014-08-08 NOTE — Addendum Note (Signed)
Addended by: Ellamae Sia on: 08/08/2014 10:21 AM   Modules accepted: Orders

## 2014-08-08 NOTE — Telephone Encounter (Signed)
I wouldn't do this now and likely not until his fluid status is levelled of.   I encouraged him to use it when he was at the last OV.

## 2014-08-08 NOTE — Telephone Encounter (Signed)
Patients wife very confused on his medication and health condition. I had her schedule an appt with you next week to discuss these concerns.

## 2014-08-08 NOTE — Telephone Encounter (Signed)
Please find out what she is confused about and let me know.  Thanks for scheduling.  Try to get 57min appointment if not already done.  Thanks.

## 2014-08-08 NOTE — Telephone Encounter (Signed)
His BP meds are to keep him out of heart failure.  He's been taken of lasix in the meantime due to his renal fuction.  We talked about this prev labs at the labs OV.  I'll await his f/u labs with directions about his lasix and other meds.  We may have to make mult changes depending on his labs.  Thanks.

## 2014-08-08 NOTE — Telephone Encounter (Signed)
Patient's wife notified as instructed by telephone. Patient's wife verbalized understanding.

## 2014-08-08 NOTE — Telephone Encounter (Addendum)
Spoke to patient's wife and was advised that she is not confused about his medication.  Patient's wife does not understand why he is on 2 blood pressure medications since his BP has been so low. Mrs. [name] stated that she feels that he had some side effects from the Lasix while he was in the hospital at [facility]. Mrs. [name] stated that they were told that his blood work was fine while he was at the hospital and that he was real confused while in the hospital. Appointment time has been changed to a 30 minute appointment as request.

## 2014-08-08 NOTE — Telephone Encounter (Signed)
Tiffany notified as instructed by telephone. Tiffany verbalized understanding.

## 2014-08-08 NOTE — Telephone Encounter (Signed)
Tiffany with [facility] is calling to get an order to overnight pulse oximetry test to see if the pt really needs the O2 since he is not very compliant. Fax to [phone]

## 2014-08-15 ENCOUNTER — Telehealth: Payer: Self-pay | Admitting: Family Medicine

## 2014-08-15 NOTE — Telephone Encounter (Signed)
Tiffany notified as instructed by telephone. Tiffany stated that the wife told her that they got the results from the xray that was done last week and the report shows that patient does not have CHF.

## 2014-08-15 NOTE — Telephone Encounter (Signed)
Is doesn't matter what she says.  He has CHF.  They are each free to disagree with me, but he still has CHF.   cxr report: cxr with mass still in left lower lobe, no sign of fluid overload currently.  He didn't have fluid overload at the time of the cxr, but that is because he was diuresed.  He still had CHF then, and still has it now.  We talked about this in detail at the last OV.  All questions were answered at that time.  We'll start over at the next OV.  The prev instructions still stand: If he has any weight gain in the AMs, ie if his AM weight is above 182 lbs and he isn't lightheaded, then take a lasix that day. If his weight is <182 or if he is lightheaded, then skip the lasix that day. He'll likely be taking it intermittently over the next few days.

## 2014-08-15 NOTE — Telephone Encounter (Signed)
Noted, thanks!

## 2014-08-15 NOTE — Telephone Encounter (Signed)
Tiffany notified as instructed by telephone. Tiffany stated that she was told by patient's wife that Lasix has been stopped. Gave Tiffany the lab results from 08/09/14 and that patient had been notified. Tiffany stated that she was not told about these results by the patient or his wife. Tiffany stated that she will be in touch with the patient's wife and reinforce the instructions. Tiffany stated that patient's weight this am was 178.9 and she will be seeing him again Tuesday and will report back to Dr. Damita Dunnings after that visit.

## 2014-08-15 NOTE — Telephone Encounter (Signed)
He has CHF.  Regardless of what he reports, and even if he doesn't have sx right now, he has CHF.

## 2014-08-15 NOTE — Telephone Encounter (Signed)
T[facility] with [facility] [phone] is at the patients home providing education on CHF but she was told that the patient had a chest xray a couple of weeks ago and was told that he did not have CHF. He has a lot of tele monitoring in his house for CHF, so she wants to make sure that he really needs it. She is requesting a call back.

## 2014-08-20 ENCOUNTER — Other Ambulatory Visit: Payer: Self-pay | Admitting: Family Medicine

## 2014-08-21 ENCOUNTER — Telehealth: Payer: Self-pay | Admitting: *Deleted

## 2014-08-21 NOTE — Telephone Encounter (Signed)
I agree, I was counting that as an extra dose.   If weight >182 or if pitting edema, then I would take the lasix that day.

## 2014-08-21 NOTE — Telephone Encounter (Signed)
Tiffany says she was told last week by this office that he was to only take the Lasix as needed so he doesn't take it every day.  He is to take the Lasix only if his weight is > 182.  Therefore, this is not really an extra dosage but Tiffany felt like he needed the Lasix because of his edema even though his weight was 180.

## 2014-08-21 NOTE — Telephone Encounter (Signed)
[name] from [facility] says she is at the patient's home now and he has 2+ pitting edema on the left leg and 1+ pitting edema on the right.  His weight is 180 pounds this morning.  She is asking if he should take an additional Lasix because of the edema?

## 2014-08-21 NOTE — Telephone Encounter (Signed)
Yes, take 1 extra lasix today and then continue daily lasix until the edema is better.  He may have to stop it intermittently later on, depending on weight and swelling.  Thanks.

## 2014-08-21 NOTE — Telephone Encounter (Signed)
Understood 

## 2014-08-28 ENCOUNTER — Encounter: Payer: Self-pay | Admitting: Family Medicine

## 2014-08-28 ENCOUNTER — Ambulatory Visit (INDEPENDENT_AMBULATORY_CARE_PROVIDER_SITE_OTHER): Payer: Medicare Other | Admitting: Family Medicine

## 2014-08-28 ENCOUNTER — Ambulatory Visit: Payer: Medicare Other | Admitting: Family Medicine

## 2014-08-28 VITALS — BP 142/70 | HR 69 | Wt 184.5 lb

## 2014-08-28 DIAGNOSIS — R0602 Shortness of breath: Secondary | ICD-10-CM

## 2014-08-28 DIAGNOSIS — I509 Heart failure, unspecified: Secondary | ICD-10-CM

## 2014-08-28 DIAGNOSIS — R410 Disorientation, unspecified: Secondary | ICD-10-CM

## 2014-08-28 DIAGNOSIS — I10 Essential (primary) hypertension: Secondary | ICD-10-CM

## 2014-08-28 LAB — BASIC METABOLIC PANEL
BUN: 44 mg/dL — ABNORMAL HIGH (ref 6–23)
CALCIUM: 9 mg/dL (ref 8.4–10.5)
CO2: 32 meq/L (ref 19–32)
CREATININE: 1.6 mg/dL — AB (ref 0.4–1.5)
Chloride: 107 mEq/L (ref 96–112)
GFR: 43.85 mL/min — ABNORMAL LOW (ref >60.00)
Glucose, Bld: 99 mg/dL (ref 70–99)
Potassium: 3.6 mEq/L (ref 3.5–5.1)
Sodium: 145 mEq/L (ref 135–145)

## 2014-08-28 LAB — BRAIN NATRIURETIC PEPTIDE: Pro B Natriuretic peptide (BNP): 3518 pg/mL — ABNORMAL HIGH (ref 0.0–100.0)

## 2014-08-28 NOTE — Assessment & Plan Note (Signed)
Would restart lasix for now, see notes on labs. Continue daily unless his weight is lower and the swelling is a lot better.  He likely has concurrent lung cancer, and that could cause confusion even w/o brain mets.  Will check CT head for planning purposes.  He wants to take a trip with his wife in 12/2014 to [facility].  His family is important to him.  He wants to avoid hospitalization.  We talked about hospice and if they could meet his goals.  We agreed to get the labs and CT done first and then go from there.  >45 minutes spent in face to face time with patient, >50% spent in counselling or coordination of care

## 2014-08-28 NOTE — Patient Instructions (Signed)
Go to the lab on the way out.  We'll contact you with your lab report. I would take a lasix tomorrow AM unless your weight is lower and the swelling is a lot better.  [name] will call about your referral (head CT).  We'll go from there.

## 2014-08-28 NOTE — Progress Notes (Signed)
Pre visit review using our clinic review tool, if applicable. No additional management support is needed unless otherwise documented below in the visit note.  Path/phys CHF d/w pt and wife, rationale for meds d/w pt. Hasn't had to take lasix every day.  Recent events reviewed.  He has more confusion recently, wife has noted.  He likely has lung CA, unknown if brain mets present.  His breathing is "not bad, not great."  Some BLE edema noted.  Compliant with other meds.  Sleeping on 1 pillow.  Weight usually 176lbs on his scales at home.  We discussed his goals.    Meds, vitals, and allergies reviewed.   ROS: See HPI.  Otherwise, noncontributory.  nad ncat Mmm Neck supple rrr ctab except for LLL crackles similar to prev.  abd soft 2+ BLE edema

## 2014-08-30 ENCOUNTER — Telehealth: Payer: Self-pay

## 2014-08-30 MED ORDER — ALPRAZOLAM 0.25 MG PO TABS: 0.2500 mg | ORAL_TABLET | Freq: Two times a day (BID) | ORAL | Status: DC | PRN

## 2014-08-30 NOTE — Telephone Encounter (Signed)
Pt is scheduled for CT scan at [facility] on 08/31/14 at 1 pm. Mrs Fedie said pt is very anxious and request cb on 08/31/14 in the afternoon with results of CT of head. Mrs Maret cannot allow pt to go all weekend worrying about test results. Mrs Boorman request cb today to confirm Dr Damita Dunnings is aware of this message and pt will receive results on 08/31/14.

## 2014-08-30 NOTE — Telephone Encounter (Signed)
Wife advised.  [facility] nurse is at the home now and patient is displaying signs of anxiousness that hasn't been seen previously and nurse says to ask if something can be called in for him to  "take the edge off" a little.

## 2014-08-30 NOTE — Telephone Encounter (Signed)
Wife advised. 

## 2014-08-30 NOTE — Telephone Encounter (Signed)
Would take an extra lasix today, ie now.  Can use very low dose of xanax for anxiety assuming his O2 stats stay >90%. If clearly hypoxic with sats <90% and any resp distress, then he'll need eval tonight.  Thanks.

## 2014-08-30 NOTE — Telephone Encounter (Signed)
I don't know how long it will take to get the information to me, but I will make every effort to get them the results as soon as I can.  I understand that it is important to them.

## 2014-08-30 NOTE — Telephone Encounter (Signed)
[place] with [facility] left v/m; Jinny Blossom was called to pts home by pts family to evaluate pt being more anxious,confused and agitated and elevated BP and swelling in legs. Pt reports to [facility] he has not had any changes since saw nurse on 08/29/14. Pt has 2+ pitting edema in bilateral lower legs; BP was 150/72 and with telemonitoring was 152/82 reported by wife; pt did have respiratory wheezing;pt has taken one lasix today but has not helped edema; no SOB. O2 sat range room air 87% - 95%. Mrs Batson is to monitor O2 and that it remains above 90 %. [place] request cb.

## 2014-08-31 ENCOUNTER — Ambulatory Visit (INDEPENDENT_AMBULATORY_CARE_PROVIDER_SITE_OTHER)
Admission: RE | Admit: 2014-08-31 | Discharge: 2014-08-31 | Disposition: A | Discharge status: Home / Self Care | Payer: Medicare Other | Source: Ambulatory Visit | Attending: Family Medicine | Admitting: Family Medicine

## 2014-08-31 DIAGNOSIS — R410 Disorientation, unspecified: Secondary | ICD-10-CM

## 2014-09-03 ENCOUNTER — Telehealth: Payer: Self-pay | Admitting: Family Medicine

## 2014-09-03 NOTE — Telephone Encounter (Signed)
I would continue the 2 lasix in AM for now.   If his AM weight is <170, if lightheaded, or if no edema, then would only take 1 lasix in AM.  Thanks.

## 2014-09-03 NOTE — Telephone Encounter (Signed)
[name] called to let dr Damita Dunnings know that' Friday 08/31/14 weight 175 bp 138/80 O2   92-93  Saturday Weight 174 bp 140/78 o2  90  Sunday  Weight 169.6 bp 112/73 o2 89  Today  Weight 171 bp 110/76 o2   87 @ 5:20am  85 @ 8:15am    90@ 12:55  [name] has been taking 2 lasik in am

## 2014-09-03 NOTE — Telephone Encounter (Signed)
Vaughan Basta advised.

## 2014-09-12 ENCOUNTER — Ambulatory Visit (INDEPENDENT_AMBULATORY_CARE_PROVIDER_SITE_OTHER)
Admission: RE | Admit: 2014-09-12 | Discharge: 2014-09-12 | Disposition: A | Discharge status: Home / Self Care | Payer: Medicare Other | Source: Ambulatory Visit | Attending: Family Medicine | Admitting: Family Medicine

## 2014-09-12 ENCOUNTER — Encounter: Payer: Self-pay | Admitting: Family Medicine

## 2014-09-12 ENCOUNTER — Ambulatory Visit (INDEPENDENT_AMBULATORY_CARE_PROVIDER_SITE_OTHER): Payer: Medicare Other | Admitting: Family Medicine

## 2014-09-12 ENCOUNTER — Telehealth: Payer: Self-pay

## 2014-09-12 VITALS — BP 130/78 | HR 76 | Temp 97.8°F | Wt 182.5 lb

## 2014-09-12 DIAGNOSIS — I10 Essential (primary) hypertension: Secondary | ICD-10-CM

## 2014-09-12 DIAGNOSIS — J189 Pneumonia, unspecified organism: Secondary | ICD-10-CM

## 2014-09-12 DIAGNOSIS — E785 Hyperlipidemia, unspecified: Secondary | ICD-10-CM

## 2014-09-12 DIAGNOSIS — R0602 Shortness of breath: Secondary | ICD-10-CM

## 2014-09-12 DIAGNOSIS — R05 Cough: Secondary | ICD-10-CM

## 2014-09-12 DIAGNOSIS — R059 Cough, unspecified: Secondary | ICD-10-CM

## 2014-09-12 DIAGNOSIS — D649 Anemia, unspecified: Secondary | ICD-10-CM

## 2014-09-12 MED ORDER — LEVOFLOXACIN 750 MG PO TABS
750.0000 mg | ORAL_TABLET | ORAL | Status: DC
Start: 2014-09-12 — End: 2014-09-17

## 2014-09-12 NOTE — Telephone Encounter (Signed)
Left message with patient and requested that his wife call me back to discuss her concerns.

## 2014-09-12 NOTE — Patient Instructions (Signed)
Go to the lab on the way out.  We'll contact you with your lab report. Take levaquin 750mg  every other day for now.   Get back on oxygen at home.  Update me tomorrow. Take care.

## 2014-09-12 NOTE — Assessment & Plan Note (Signed)
CXR with new R lower lobe lesion.  Known L lung mass still seen on CXR.  All d/w pt at [facility].  Pulse ox up to 92-94% resting on RA in OV.  He is still okay for outpatient f/u, he doesn't want to go to hospital.  Advised to restart O2 at home, start levaquin.  Check basic labs today.  No other changes in meds yet, that may change when I see his labs.  We talked about my limited ability to help him- if he wants to stay at home, then that is his choice.  He condition will likely worsen over time and then we'll likely have to make arrangements at that point.  I would prefer to make gradual changes ahead of time and not try to rush through if he deteriorates quickly.   Again, we have talked about hospice and his goals prev.  >25 minutes spent in face to face time with patient, >50% spent in counselling or coordination of care.

## 2014-09-12 NOTE — Telephone Encounter (Signed)
Tiffany notified as instructed by telephone. Tiffany requested that patient come to the office and get xray done since he is mobile and able to come to the office. Tiffany stated that patient's wife has other concerns and states that patient's confusion has not cleared up and wants his iron panel checked because he stays cold all the time, eating ice chips and shows signs of being anemic. Tiffany requested that the patient's wife be contacted regarding the chest x-ray and her other concerns. Please advise.

## 2014-09-12 NOTE — Telephone Encounter (Signed)
If there is a 30 min slot, then you can put him on the schedule, come early to get CXR before the visit, labs potentially done on the way out.  The confusion is still likely related to his overall illness, including likely lung CA and heart failure.  Those conditions haven't changed.  He was not anemia on last CBC.   If needing to be seen sooner than the potential office visit here at [facility], then UC/ER.

## 2014-09-12 NOTE — Progress Notes (Signed)
Pre visit review using our clinic review tool, if applicable. No additional management support is needed unless otherwise documented below in the visit note.  Cough today, some discolored sputum, more SOB.  Using O2 at home at night and prn during the day.  Off O2 at OV.  Still with some confusion.  Weight has been stable at home, usually 170-175 lbs on his scale.  Usually taking 1 lasix a day, occ 2 tabs if weight has been up.  Had been eating ice chips and wife was worried about iron levels.  He/she reports patient feeling cold frequently.    We had talked about CHF, likely lung CA dx, and goals/hospice prev.  See last OV note.  Prev CT head showed no brain mets.   PMH and SH reviewed  ROS: See HPI, otherwise noncontributory.  Meds, vitals, and allergies reviewed.   nad ncat Mmm Neck supple rrr ctab in superior fields, LLL crackles similar to prev, now with new RLL crackles No inc in wob abd soft trace BLE edema, improved from prev.   CXR with new R lower lobe lesion.  Known L lung mass still seen on CXR.  Pulse ox up to 92-94% resting on RA in OV.

## 2014-09-12 NOTE — Telephone Encounter (Signed)
Tiffany with [facility] left v/m requesting order for CXR. Pt having more SOB than usual and prod cough; O2 sats are 88 - 89 % on oxygen 2 L. Tiffany request cb.

## 2014-09-12 NOTE — Telephone Encounter (Signed)
Patient's wife called back and appointments scheduled for today.

## 2014-09-12 NOTE — Telephone Encounter (Signed)
Proceed with CXR, get call report.  I will be in route to clinic later today and due to driving will be temporarily off the EMR.  You can call me if needed.

## 2014-09-13 ENCOUNTER — Telehealth: Payer: Self-pay

## 2014-09-13 LAB — COMPREHENSIVE METABOLIC PANEL
ALT: 35 U/L (ref 0–53)
AST: 30 U/L (ref 0–37)
Albumin: 3.2 g/dL — ABNORMAL LOW (ref 3.5–5.2)
Alkaline Phosphatase: 47 U/L (ref 39–117)
BILIRUBIN TOTAL: 1.2 mg/dL (ref 0.2–1.2)
BUN: 41 mg/dL — AB (ref 6–23)
CALCIUM: 9.6 mg/dL (ref 8.4–10.5)
CO2: 34 mEq/L — ABNORMAL HIGH (ref 19–32)
CREATININE: 1.62 mg/dL — AB (ref 0.40–1.50)
Chloride: 102 mEq/L (ref 96–112)
GFR: 43.84 mL/min — AB (ref >60.00)
Glucose, Bld: 114 mg/dL — ABNORMAL HIGH (ref 70–99)
Potassium: 3.6 mEq/L (ref 3.5–5.1)
Sodium: 146 mEq/L — ABNORMAL HIGH (ref 135–145)
TOTAL PROTEIN: 6.9 g/dL (ref 6.0–8.3)

## 2014-09-13 LAB — CBC WITH DIFFERENTIAL/PLATELET
Basophils Absolute: 0 10*3/uL (ref 0.0–0.1)
Basophils Relative: 0.4 % (ref 0.0–3.0)
EOS PCT: 0.1 % (ref 0.0–5.0)
Eosinophils Absolute: 0 10*3/uL (ref 0.0–0.7)
HEMATOCRIT: 43.5 % (ref 39.0–52.0)
HEMOGLOBIN: 14 g/dL (ref 13.0–17.0)
LYMPHS ABS: 0.9 10*3/uL (ref 0.7–4.0)
Lymphocytes Relative: 12 % (ref 12.0–46.0)
MCHC: 32.2 g/dL (ref 30.0–36.0)
MCV: 91.8 fl (ref 78.0–100.0)
Monocytes Absolute: 0.3 10*3/uL (ref 0.1–1.0)
Monocytes Relative: 3.4 % (ref 3.0–12.0)
NEUTROS ABS: 6.7 10*3/uL (ref 1.4–7.7)
NEUTROS PCT: 84.1 % — AB (ref 43.0–77.0)
Platelets: 199 10*3/uL (ref 150.0–400.0)
RBC: 4.73 Mil/uL (ref 4.22–5.81)
RDW: 19.8 % — ABNORMAL HIGH (ref 11.5–15.5)
WBC: 7.9 10*3/uL (ref 4.0–10.5)

## 2014-09-13 LAB — IBC PANEL
Iron: 52 ug/dL (ref 42–165)
Saturation Ratios: 12.1 % — ABNORMAL LOW (ref 20.0–50.0)
TRANSFERRIN: 308 mg/dL (ref 212.0–360.0)

## 2014-09-13 LAB — BRAIN NATRIURETIC PEPTIDE

## 2014-09-13 NOTE — Telephone Encounter (Signed)
Call wife - ok to hold on med for now. Will see what PCP recommends tomorrow (or tonight if he gets this message).

## 2014-09-13 NOTE — Telephone Encounter (Signed)
Patient's wife notified and verbalized understanding. However, she said that if Dr. Damita Dunnings would actually read about medications before prescribing them, she doubted he would ever prescribe them. She said the side effects were worse than the actual illness. I advised that just because side effects are listed, does not mean that everyone that takes the med will have them and that Dr. Damita Dunnings was well aware of medications and wouldn't prescribe them if he didn't think they were appropriate.

## 2014-09-13 NOTE — Telephone Encounter (Signed)
Ms Yeatts left v/m and was to cb today with update; pt is doing OK but Ms Halt does not want pt to take Levoquin;thinks med is too strong; there are too many possible consequences or side effects to med. Ms Lavell request cb.

## 2014-09-14 ENCOUNTER — Encounter: Payer: Self-pay | Admitting: *Deleted

## 2014-09-14 NOTE — Telephone Encounter (Signed)
I am familiar with the medicine.  It is appropriate to prescribe and take.  He has a known lung mass and a new PNA by CXR.  It really is up to the patient and his wife about what to do.  I have been more than willing to try to explain the rationale for his treatment throughout this process. They should find another doctor/clinic for a second opinion if they have concerns.

## 2014-09-14 NOTE — Telephone Encounter (Signed)
Wife states she and patient do have confidence in your care of the patient but that patient became so weak after taking the medication, he couldn't even walk and wouldn't eat.  Wife says "I'm the one that has to carry him on my back" and that prior to this, he has had a good appetite and yesterday, he wouldn't eat at all.  Wife asks if he could have something that is not as strong?  She states "you know it's strong if you only take it every other day" and I can read the pamphlet.  When I explained that the patient has a known lung mass and a new PNA by CXR, she interrupted by saying "they say it is probable PNA but they don't know for sure".  Wife asked that I explain this to you.  She is concerned with how he's acting on this medication.  She is with him all the time and wishes to keep him around a bit longer.

## 2014-09-14 NOTE — Telephone Encounter (Signed)
Wife advised.  She says they will work on it.

## 2014-09-14 NOTE — Telephone Encounter (Addendum)
He's more SOB with discolored sputum with abnormal lung findings that match the CXR findings. All available evidence points to the fact that he has PNA. I don't need that explained to me.   If he's getting weaker, it's probably from the PNA, the prev lung mass, the ongoing CHF. This med is the best oral option I know of and I'm obligated NOT to offer substandard treatment that is less likely to work (ie a different abx). They can make up their mind one way or the other, but I'm not changing the abx.  If they are concerned about him worsening, then they should consider having him rechecked.  That is still a reasonable option.  As far as "keeping him around a bit longer" is concerned, then that is a goals of care issue that she needs to resolve with him.  He has opted not to w/u the lung mass/likely lung cancer.

## 2014-09-17 ENCOUNTER — Ambulatory Visit (INDEPENDENT_AMBULATORY_CARE_PROVIDER_SITE_OTHER): Payer: Medicare Other | Admitting: Cardiovascular Disease

## 2014-09-17 ENCOUNTER — Telehealth: Payer: Self-pay

## 2014-09-17 ENCOUNTER — Encounter: Payer: Self-pay | Admitting: Cardiovascular Disease

## 2014-09-17 VITALS — BP 126/60 | HR 66 | Ht 76.0 in | Wt 184.2 lb

## 2014-09-17 DIAGNOSIS — I255 Ischemic cardiomyopathy: Secondary | ICD-10-CM

## 2014-09-17 DIAGNOSIS — I5022 Chronic systolic (congestive) heart failure: Secondary | ICD-10-CM

## 2014-09-17 MED ORDER — FUROSEMIDE 40 MG PO TABS: 40.0000 mg | ORAL_TABLET | Freq: Two times a day (BID) | ORAL | Status: AC

## 2014-09-17 NOTE — Telephone Encounter (Signed)
Phone call from pt.  Reported he has had problems with walking over past month, due to pain in the right hip and down the right leg.  Stated he has swelling in both legs with right > left.  Stated he saw his cardiologist today, and the fluid pill dose has been changed.  Stated Dr. Julianne Handler recommended to make an appt. with Dr. Oneida Alar for his right leg pain to be evaluated.  Denies any open sores on his lower extremities.  Advised will call pt. Back with new appt.

## 2014-09-17 NOTE — Patient Instructions (Addendum)
Your physician recommends that you schedule a follow-up appointment in:  3 months. --Scheduled for Dec 25, 2014 at 8:00  Your physician recommends that you return for lab work in: 1 week--September 24, 2014.  The lab opens at 7:30 AM.   Your physician has recommended you make the following change in your medication:  Increase furosemide to 40 mg by mouth twice daily

## 2014-09-17 NOTE — Progress Notes (Signed)
History of Present Illness: 81 yo male with history of HLD, COPD, BPH, tobacco abuse, lung cancer, stage 3 CKD, CVA, AAA, carotid artery disease, HTN here today for hospital follow up. He was admitted to [facility] December 2015 with SOB and found to have CHF. I saw him as a new consult in the hospital. He is known to have advanced stage lung cancer but has refused further workup. He has been followed by pulmonary. He has been seen in the past by Dr. Martinique in our practice but is not known to have CAD. Admitted with elevated BNP and found to have community acquired pneumonia and pulmonary edema c/w CHF. He has been diuresed and felt much better. No chest pain. Echo with LVEF=45-50% with lateral wall akinesis, mild AS, mild MR. Normal stress myoview May 2012. Long history of tobacco abuse with COPD.   He is here today for follow up. He has had no chest pain. Continues to have dyspnea likely from his lung disease. Continued LE edema. He is still refusing further evaluation for likely lung cancer. Having left leg pain and to see Dr. Oneida Alar soon.   Primary Care Physician:  Last Lipid Profile:Lipid Panel     Component Value Date/Time   CHOL 205* 03/23/2013 0819   TRIG 194.0* 03/23/2013 0819   HDL 30.20* 03/23/2013 0819   CHOLHDL 7 03/23/2013 0819   VLDL 38.8 03/23/2013 0819   LDLCALC 103* 12/14/2011 0827     Past Medical History  Diagnosis Date  . Hyperlipidemia   . Allergy   . COPD (chronic obstructive pulmonary disease)     via X-ray  . BPH (benign prostatic hyperplasia)     Hx of (Dr. Rosana Hoes)  . Gallstones     via CT  . Cigarette smoker   . CVA (cerebral infarction)   . Degeneration of intervertebral disc, site unspecified   . Stricture and stenosis of esophagus   . Unspecified gastritis and gastroduodenitis without mention of hemorrhage   . Duodenitis without mention of hemorrhage   . Benign neoplasm of colon   . Diverticulosis of colon (without mention of hemorrhage)   .  Abdominal aneurysm without mention of rupture     followed by [facility] (Dr. Oneida Alar)  . Gout, unspecified   . Personal history of urinary calculi   . Hyperglycemia   . Low back pain   . Nausea   . Carotid arterial disease 08/2010    on [place] 60-80% L ICA and <40% R ICA  . PAD (peripheral artery disease)   . Hypertension   . Stroke 2008    Mini Stroke    Past Surgical History  Procedure Laterality Date  . Inguinal hernia repair  03/21/2002    Bilateral  . Esophagogastroduodenoscopy  12/15/2004    Duodenitis, gastritis, esoph stricture, path neg  . Ct of abdomen  10/09/2004    5.1 cm AAA gallstone; 7 cm. stone in the pole of the right kidney  . Ct of pelvis  10/09/2004    Normal except for divertic of bladder  . [place] of aaa  07/06/2005    Unchanged 4.9 cm. (Dr. Amedeo Plenty)  . [place] of aaa  12/24/2005    5.2 cm  . [place] of abdomen  06/24/2006    5.5 cm  . Adenosine myoview  07/13/2007    Normal  . Abdominal aortogram  07/29/2006    Infrafenal abdominal aortic aneurysm with total occulision of inferior mesenteric artery  . Abdomen ct of angio  07/22/2006    Right kidney stone, L4/5/S1 DDD; AAA 5.3 cm. gallstones  . Aaa stent graft  08/31/2006    Right artery repair -- S/P Post op CVA  . Ct of head  09/01/2006    Small vessel changes  . Pre-surgery carotid US      Mod right ICA; mild left ICA stenosis  . Mri brain  09/01/2006    Normal  . Abdominal US  11/18/2006    Normal  . Toe amputation      L 4th 2012  . Femoral-popliteal bypass graft      left, 2012  . Cataract extraction      2012, bilateral  . Eye surgery      Current Outpatient Prescriptions  Medication Sig Dispense Refill  . acetaminophen (TYLENOL) 325 MG tablet Take 650 mg by mouth every 6 (six) hours as needed for moderate pain (pain).     Marland Kitchen albuterol (PROVENTIL HFA;VENTOLIN HFA) 108 (90 BASE) MCG/ACT inhaler Inhale 2 puffs into the lungs every 4 (four) hours as needed for wheezing. 54 g 3  . ALPRAZolam (XANAX) 0.25 MG tablet Take 1  tablet (0.25 mg total) by mouth 2 (two) times daily as needed for anxiety (sedation caution). 20 tablet 0  . budesonide-formoterol (SYMBICORT) 160-4.5 MCG/ACT inhaler Inhale 2 puffs into the lungs 2 (two) times daily. 3 Inhaler 3  . captopril (CAPOTEN) 50 MG tablet Take 1 tablet (50 mg total) by mouth 2 (two) times daily. 180 tablet 3  . Cholecalciferol (VITAMIN D PO) Take 5,000 Units by mouth daily.    . [place] Calcium 1000 (390 CA) MG TABS Take 1 tablet by mouth daily.    . fenofibrate 160 MG tablet Take 1 tablet (160 mg total) by mouth daily. 90 tablet 3  . furosemide (LASIX) 40 MG tablet TAKE 1 TABLET BY MOUTH EVERY DAY 30 tablet 3  . metoCLOPramide (REGLAN) 5 MG tablet Take 1 tablet (5 mg total) by mouth daily as needed. 90 tablet 3  . metoprolol (LOPRESSOR) 100 MG tablet Take 1 tablet (100 mg total) by mouth daily. 90 tablet 3  . NON FORMULARY Oxygen 2 liters as needed    . pantoprazole (PROTONIX) 40 MG tablet TAKE 1 TABLET BY MOUTH DAILY 90 tablet 3  . vitamin E 400 UNIT capsule Take 400 Units by mouth daily.    Marland Kitchen zolpidem (AMBIEN) 5 MG tablet Take 1 tablet (5 mg total) by mouth at bedtime as needed for sleep. 15 tablet 0   No current facility-administered medications for this visit.    Allergies  Allergen Reactions  . Aspirin     REACTION: GI intolerance  . Sulfonamide Derivatives     REACTION: nausea    History   Social History  . Marital Status: Married    Spouse Name: N/A    Number of Children: 1  . Years of Education: N/A   Occupational History  . [place] [employer] History Main Topics  . Smoking status: Former Smoker -- 1.00 packs/day for 50 years    Types: Cigarettes    Quit date: 08/26/2013  . Smokeless tobacco: Never Used  . Alcohol Use: No  . Drug Use: No  . Sexual Activity: Not on file   Other Topics Concern  . Not on file   Social History Narrative   Remarried 2008, lives with wife.   1 daughter in  [place].   Likes to travel.  Family History  Problem Relation Age of Onset  . Heart disease Mother     MI  . Hypertension Mother   . Varicose Veins Mother   . Heart attack Mother   . Aneurysm Father   . Diabetes Neg Hx   . Arthritis Neg Hx   . Depression Neg Hx   . Alcohol abuse Neg Hx   . Drug abuse Neg Hx   . Colon cancer Neg Hx   . Prostate cancer Neg Hx   . Cancer Brother     Lung  . Heart attack Brother   . Heart disease Brother     MI    Review of Systems:  As stated in the HPI and otherwise negative.   BP 126/60 mmHg  Pulse 66  Ht 6\' 4"  (1.93 m)  Wt 184 lb 3.2 oz (83.553 kg)  BMI 22.43 kg/m2  SpO2 96%  Physical Examination: General: Well developed, well nourished, NAD HEENT: OP clear, mucus membranes moist SKIN: warm, dry. No rashes. Neuro: No focal deficits Musculoskeletal: Muscle strength 5/5 all ext Psychiatric: Mood and affect normal Neck: No JVD, no carotid bruits, no thyromegaly, no lymphadenopathy. Lungs:Clear bilaterally, no wheezes, rhonci, crackles Cardiovascular: Regular rate and rhythm. No murmurs, gallops or rubs. Abdomen:Soft. Bowel sounds present. Non-tender.  Extremities: No lower extremity edema. Pulses are 2 + in the bilateral DP/PT.  Echo 07/24/14: Left ventricle: The cavity size was normal. There was mild concentric hypertrophy with moderate focal basal hypertrophy. Systolic function was mildly reduced. The estimated ejection fraction was in the range of 45% to 50%. There is akinesis of the lateral myocardium. Doppler parameters are consistent with abnormal left ventricular relaxation (grade 1 diastolic dysfunction). - Aortic valve: Cusp separation was reduced. There was mild stenosis. - Mitral valve: There was mild regurgitation. - Left atrium: The atrium was mildly dilated. - Right ventricle: The cavity size was mildly dilated. Wall thickness was normal. - Right atrium: The atrium was mildly dilated. -  Pulmonary arteries: Systolic pressure was severely increased. PA peak pressure: 75 mm Hg (S).  Assessment and Plan:   1. Chronic diastolic and systolic CHF: Weight stable on daily Lasix but BNP elevated last week and he is still dyspneic with LE edema. Will increase Lasix to 40 mg po BID. Repeat BMET one week. Baseline creatinine is around 1.6.   2. Cardiomyopathy: LVEF=45-50% with lateral wall akinesis. High probability of obstructive CAD given long term use of tobacco and known PAD, carotid artery disease. I have discussed stress testing but given his likely lung cancer, he prefers not to pursue further cardiac evaluation. I think this is reasonable especially considering there have been talks by primary care regarding hospice as there is a high probability that his lung disease will progress rapidly. I would pursue invasive evaluation if he had lifestyle limiting angina. Will continue beta blocker and Ace-inh.

## 2014-09-19 ENCOUNTER — Encounter: Payer: Self-pay | Admitting: Family

## 2014-09-19 ENCOUNTER — Other Ambulatory Visit: Payer: Self-pay | Admitting: *Deleted

## 2014-09-19 DIAGNOSIS — I739 Peripheral vascular disease, unspecified: Secondary | ICD-10-CM

## 2014-09-20 ENCOUNTER — Ambulatory Visit (HOSPITAL_COMMUNITY)
Admission: RE | Admit: 2014-09-20 | Discharge: 2014-09-20 | Disposition: A | Discharge status: Home / Self Care | Payer: Medicare Other | Source: Ambulatory Visit | Attending: Family | Admitting: Family

## 2014-09-20 ENCOUNTER — Encounter: Payer: Self-pay | Admitting: Family

## 2014-09-20 ENCOUNTER — Encounter: Payer: Self-pay | Admitting: Vascular Surgery

## 2014-09-20 ENCOUNTER — Ambulatory Visit (INDEPENDENT_AMBULATORY_CARE_PROVIDER_SITE_OTHER): Payer: Medicare Other | Admitting: Family

## 2014-09-20 ENCOUNTER — Other Ambulatory Visit: Payer: Self-pay

## 2014-09-20 VITALS — BP 116/69 | HR 54 | Resp 14 | Ht 76.0 in | Wt 180.0 lb

## 2014-09-20 DIAGNOSIS — I10 Essential (primary) hypertension: Secondary | ICD-10-CM | POA: Diagnosis not present

## 2014-09-20 DIAGNOSIS — M25561 Pain in right knee: Secondary | ICD-10-CM

## 2014-09-20 DIAGNOSIS — I739 Peripheral vascular disease, unspecified: Secondary | ICD-10-CM | POA: Diagnosis not present

## 2014-09-20 DIAGNOSIS — I998 Other disorder of circulatory system: Secondary | ICD-10-CM

## 2014-09-20 DIAGNOSIS — I6523 Occlusion and stenosis of bilateral carotid arteries: Secondary | ICD-10-CM

## 2014-09-20 DIAGNOSIS — I70229 Atherosclerosis of native arteries of extremities with rest pain, unspecified extremity: Secondary | ICD-10-CM

## 2014-09-20 DIAGNOSIS — Z87891 Personal history of nicotine dependence: Secondary | ICD-10-CM | POA: Insufficient documentation

## 2014-09-20 DIAGNOSIS — I255 Ischemic cardiomyopathy: Secondary | ICD-10-CM

## 2014-09-20 DIAGNOSIS — Z95828 Presence of other vascular implants and grafts: Secondary | ICD-10-CM

## 2014-09-20 DIAGNOSIS — M25569 Pain in unspecified knee: Secondary | ICD-10-CM | POA: Insufficient documentation

## 2014-09-20 NOTE — Progress Notes (Signed)
[facility] [place] HISTORY AND PHYSICAL   MRN : [id number]  History of Present Illness:   DRAEDEN KELLMAN is a 81 y.o. male patient of Dr. Oneida Alar who is status post left femoropopliteal bypass graft with vein placed May of 2012.  He had left 4th toe amputation in 2012.  He also had EVAR stent placement 08/31/2006 for AAA.  The patient has no history of carotid artery intervention.  He returns today with C/O Right Hip to foot pain, duration 2-3 wks., shortness of breath w/exertion 2 1/2 mos, advised by Dr. Julianne Handler to be evaluated by our practice.  He saw Dr. Damita Dunnings, his PCP, about a month ago, for sudden onset right hip to calf with walking, relieved by rest. Bleeding wound developed in right great toe about 3 days ago when pt peeled off loose skin, great toe was itching also. He was hospitalized in December 2015 at [facility] with CHF, denies MI.  Patient denies recent symptoms of TIA or stroke. He does report a TIA in 2008 after the EVAR as manifested by slurred speech and syncope while in bed, denies amaurosis fugax or monocular blindness, denies facial drooping, denies hemiplegia, denies any further TIA or stroke symptoms.  The patient has not had back, chest, or abdominal pain.   Dr. Elsworth Soho is following a mass on his lung, pt decided not to have a lung bx.   Pt Diabetic: No  Pt smoker: former smoker (quit about May 2015, started in his 20's)  Pt meds include: Statin :No Betablocker: Yes ASA: No, GI intolerance Other anticoagulants/antiplatelets: no    Current Outpatient Prescriptions  Medication Sig Dispense Refill  . acetaminophen (TYLENOL) 325 MG tablet Take 650 mg by mouth every 6 (six) hours as needed for moderate pain (pain).     Marland Kitchen albuterol (PROVENTIL HFA;VENTOLIN HFA) 108 (90 BASE) MCG/ACT inhaler Inhale 2 puffs into the lungs every 4 (four) hours as needed for wheezing. 54 g 3  . ALPRAZolam (XANAX) 0.25 MG tablet Take 1 tablet (0.25  mg total) by mouth 2 (two) times daily as needed for anxiety (sedation caution). 20 tablet 0  . budesonide-formoterol (SYMBICORT) 160-4.5 MCG/ACT inhaler Inhale 2 puffs into the lungs 2 (two) times daily. 3 Inhaler 3  . captopril (CAPOTEN) 50 MG tablet Take 1 tablet (50 mg total) by mouth 2 (two) times daily. 180 tablet 3  . Cholecalciferol (VITAMIN D PO) Take 5,000 Units by mouth daily.    . Coral Calcium 1000 (390 CA) MG TABS Take 1 tablet by mouth daily.    . fenofibrate 160 MG tablet Take 1 tablet (160 mg total) by mouth daily. 90 tablet 3  . furosemide (LASIX) 40 MG tablet Take 1 tablet (40 mg total) by mouth 2 (two) times daily. 60 tablet 6  . metoCLOPramide (REGLAN) 5 MG tablet Take 1 tablet (5 mg total) by mouth daily as needed. 90 tablet 3  . metoprolol (LOPRESSOR) 100 MG tablet Take 1 tablet (100 mg total) by mouth daily. 90 tablet 3  . NON FORMULARY Oxygen 2 liters as needed    . pantoprazole (PROTONIX) 40 MG tablet TAKE 1 TABLET BY MOUTH DAILY 90 tablet 3  . vitamin E 400 UNIT capsule Take 400 Units by mouth daily.    Marland Kitchen zolpidem (AMBIEN) 5 MG tablet Take 1 tablet (5 mg total) by mouth at bedtime as needed for sleep. 15 tablet 0   No current facility-administered medications for this visit.  Past Medical History  Diagnosis Date  . Hyperlipidemia   . Allergy   . COPD (chronic obstructive pulmonary disease)     via X-ray  . BPH (benign prostatic hyperplasia)     Hx of (Dr. Rosana Hoes)  . Gallstones     via CT  . Cigarette smoker   . CVA (cerebral infarction)   . Degeneration of intervertebral disc, site unspecified   . Stricture and stenosis of esophagus   . Unspecified gastritis and gastroduodenitis without mention of hemorrhage   . Duodenitis without mention of hemorrhage   . Benign neoplasm of colon   . Diverticulosis of colon (without mention of hemorrhage)   . Abdominal aneurysm without mention of rupture     followed by [facility] (Dr. Oneida Alar)  . Gout, unspecified   .  Personal history of urinary calculi   . Hyperglycemia   . Low back pain   . Nausea   . Carotid arterial disease 08/2010    on [place] 60-80% L ICA and <40% R ICA  . PAD (peripheral artery disease)   . Hypertension   . Stroke 2008    Mini Stroke    Social History History  Substance Use Topics  . Smoking status: Former Smoker -- 1.00 packs/day for 50 years    Types: Cigarettes    Quit date: 08/26/2013  . Smokeless tobacco: Never Used  . Alcohol Use: No    Family History Family History  Problem Relation Age of Onset  . Heart disease Mother     MI  . Hypertension Mother   . Varicose Veins Mother   . Heart attack Mother   . Aneurysm Father   . Diabetes Neg Hx   . Arthritis Neg Hx   . Depression Neg Hx   . Alcohol abuse Neg Hx   . Drug abuse Neg Hx   . Colon cancer Neg Hx   . Prostate cancer Neg Hx   . Cancer Brother     Lung  . Heart attack Brother   . Heart disease Brother     MI    Surgical History Past Surgical History  Procedure Laterality Date  . Inguinal hernia repair  03/21/2002    Bilateral  . Esophagogastroduodenoscopy  12/15/2004    Duodenitis, gastritis, esoph stricture, path neg  . Ct of abdomen  10/09/2004    5.1 cm AAA gallstone; 7 cm. stone in the pole of the right kidney  . Ct of pelvis  10/09/2004    Normal except for divertic of bladder  . [place] of aaa  07/06/2005    Unchanged 4.9 cm. (Dr. Amedeo Plenty)  . [place] of aaa  12/24/2005    5.2 cm  . [place] of abdomen  06/24/2006    5.5 cm  . Adenosine myoview  07/13/2007    Normal  . Abdominal aortogram  07/29/2006    Infrafenal abdominal aortic aneurysm with total occulision of inferior mesenteric artery  . Abdomen ct of angio  07/22/2006    Right kidney stone, L4/5/S1 DDD; AAA 5.3 cm. gallstones  . Aaa stent graft  08/31/2006    Right artery repair -- S/P Post op CVA  . Ct of head  09/01/2006    Small vessel changes  . Pre-surgery carotid US      Mod right ICA; mild left ICA stenosis  . Mri brain  09/01/2006     Normal  . Abdominal US  11/18/2006    Normal  . Toe amputation  L 4th 2012  . Femoral-popliteal bypass graft      left, 2012  . Cataract extraction      2012, bilateral  . Eye surgery      Allergies  Allergen Reactions  . Aspirin Nausea Only and Other (See Comments)    REACTION: GI intolerance  . Sulfonamide Derivatives Nausea Only    REACTION: nausea    Current Outpatient Prescriptions  Medication Sig Dispense Refill  . acetaminophen (TYLENOL) 325 MG tablet Take 650 mg by mouth every 6 (six) hours as needed for moderate pain (pain).     Marland Kitchen albuterol (PROVENTIL HFA;VENTOLIN HFA) 108 (90 BASE) MCG/ACT inhaler Inhale 2 puffs into the lungs every 4 (four) hours as needed for wheezing. 54 g 3  . ALPRAZolam (XANAX) 0.25 MG tablet Take 1 tablet (0.25 mg total) by mouth 2 (two) times daily as needed for anxiety (sedation caution). 20 tablet 0  . budesonide-formoterol (SYMBICORT) 160-4.5 MCG/ACT inhaler Inhale 2 puffs into the lungs 2 (two) times daily. 3 Inhaler 3  . captopril (CAPOTEN) 50 MG tablet Take 1 tablet (50 mg total) by mouth 2 (two) times daily. 180 tablet 3  . Cholecalciferol (VITAMIN D PO) Take 5,000 Units by mouth daily.    . Coral Calcium 1000 (390 CA) MG TABS Take 1 tablet by mouth daily.    . fenofibrate 160 MG tablet Take 1 tablet (160 mg total) by mouth daily. 90 tablet 3  . furosemide (LASIX) 40 MG tablet Take 1 tablet (40 mg total) by mouth 2 (two) times daily. 60 tablet 6  . metoCLOPramide (REGLAN) 5 MG tablet Take 1 tablet (5 mg total) by mouth daily as needed. 90 tablet 3  . metoprolol (LOPRESSOR) 100 MG tablet Take 1 tablet (100 mg total) by mouth daily. 90 tablet 3  . NON FORMULARY Oxygen 2 liters as needed    . pantoprazole (PROTONIX) 40 MG tablet TAKE 1 TABLET BY MOUTH DAILY 90 tablet 3  . vitamin E 400 UNIT capsule Take 400 Units by mouth daily.    Marland Kitchen zolpidem (AMBIEN) 5 MG tablet Take 1 tablet (5 mg total) by mouth at bedtime as needed for sleep. 15 tablet 0    No current facility-administered medications for this visit.     REVIEW OF SYSTEMS: See HPI for pertinent positives and negatives.  Physical Examination Filed Vitals:   09/20/14 1054  BP: 116/69  Pulse: 54  Resp: 14  Height: 6\' 4"  (1.93 m)  Weight: 180 lb (81.647 kg)  SpO2: 91%   Body mass index is 21.92 kg/(m^2).  General: WDWN in NAD  Gait: limp  HENT: WNL  Eyes: Pupils equal  Pulmonary: slightly labored breathing at rest , + Rales left posterior fields, - rhonchi, - wheezes Cardiac: RRR, positive for murmer   Abdomen: soft, NT, no palpable masses  Skin: several AK's on forearms/hands, see extremities; positive for multiple small varicoities in ankles.    VASCULAR EXAM  Carotid Bruits  Left  Right    Negative  Negative    Aorta is not  palpable Radial pulses are 2+ palpable    VASCULAR EXAM:  Extremities with ischemic changes: dark lateral edges of 4th and 5th right toes, tip of right great toe is denuded of dermal layer, red moist tissue exposed, left 4th toe surgically absent.   Positive for 2-3+ bilateral pretibial pitting edema.   LE Pulses  LEFT  RIGHT   FEMORAL  palpable  palpable   POPLITEAL  Not palpable  Not palpable   POSTERIOR TIBIAL  not palpable  not palpable   DORSALIS PEDIS  ANTERIOR TIBIAL  not palpable  not palpable    Musculoskeletal: no muscle wasting or atrophy; left 4th toe surgically absent.  Neurologic: A&O X 3; Appropriate Affect ; SENSATION: normal; MOTOR FUNCTION: 5/5 Symmetric, CN 2-12 intact, Speech is fluent/normal.   Carotid Duplex on 05/25/14: 1. Less than 40% right internal carotid artery stenosis 2. 60 - 79% left internal carotid artery stenosis.  Non-Invasive Vascular Imaging (09/20/2014):  ABI (Date: 09/20/2014)  R: absent (05/25/14, 0.72), DP: absent, PT: absent, TBI: 0  L: 1.11 (05/25/14, 1.07), DP: biphasic, PT: triphasic, TBI: 0.61    ASSESSMENT:  SIMPSON PAULOS is a 81  y.o. male  who is status post left femoropopliteal bypass graft with vein placed May of 2012.  He had left 4th toe amputation in 2012.  He also had EVAR stent placement 08/31/2006 for AAA.  As best can be determined, about 2-4 weeks ago pt started having pain from right hips to calf with walking, relieved by rest. 3 days ago patient denuded his right great toe tip by pulling off loose skin.  Uncertain as to when patient developed dark lateral edges of right 4th and 5th toes. ABI's today are absent in right ankle and foot, no Doppler waveforms in right ankle and foot, pulses are absent in right foot with tissue loss in right first, fourth, and fifth toes. Critical right leg ischemia.  Plan arteriogram, see Plan.  Dr. Oneida Alar spoke with patient's cardiologist Dr. Julianne Handler.   PLAN:   Based on today's exam and non-invasive vascular lab results, and after Dr. Oneida Alar spoke with patient and wife, and examined patient, schedule arteriogram on 09/25/14 with Dr. Bridgett Larsson.   Clemon Chambers, RN, MSN, FNP-C [facility] Office: [phone]  Clinic MD: [facility] 09/20/2014 11:06 AM

## 2014-09-20 NOTE — Progress Notes (Unsigned)
History and exam details as provided by our nurse practitioner. Patient with acute on chronic ischemia right leg. He has a new ulceration on the right first toe. He has no flow on ABIs today. Patient had prior left femoral to above-knee popliteal bypass by me in 2012 with an amputation of one toe at that time. He also previously had abdominal aortic aneurysm stent grafting by Dr. Amedeo Plenty several years ago. He has known peripheral arterial disease in the right lower extremity but has had progressive changes. He has multiple comorbidities including what is most likely metastatic lung cancer although no tissue diagnosis currently his PET scan 1 year ago was highly suggestive of this. The patient refused to undergo biopsy. He also is currently not interested in any treatment for lung cancer currently. He is on home oxygen approximately 18 hours a day. He has known coronary artery disease with no recent objective testing. I spoke with Dr. Blanchard Mane regarding this today. If any significant invasive procedure was considered he would need to have at least noninvasive testing if not cardiac catheterization.  I did discuss with the patient and his wife today the possibility of an arteriogram and possible percutaneous intervention. However the patient most likely has multilevel disease and a percutaneous option is probably not going to be feasible. If we are not able to do a percutaneous revascularization, in light of the above multiple comorbidities I do not believe that he is a reasonable bypass candidate. I did discuss with the patient today the possibility that he require primary amputation. Potentially this would be a below-knee amputation. However based on the arteriogram findings this certainly could be an above-knee amputation.  Hopefully there is something easy that we can fix percutaneously. Otherwise I will have further discussions with the family regarding possible amputation.  Ruta Hinds, MD [facility] [place] Office: [phone] Pager: [phone]

## 2014-09-23 MED ORDER — SODIUM CHLORIDE 0.9 % IV SOLN
INTRAVENOUS | Status: DC
  Administered 2014-09-24: 11:00:00 via INTRAVENOUS

## 2014-09-24 ENCOUNTER — Ambulatory Visit (HOSPITAL_COMMUNITY)
Admission: RE | Admit: 2014-09-24 | Discharge: 2014-09-24 | Disposition: A | Discharge status: Home / Self Care | Payer: Medicare Other | Source: Ambulatory Visit | Attending: Vascular Surgery | Admitting: Vascular Surgery

## 2014-09-24 ENCOUNTER — Other Ambulatory Visit: Payer: Medicare Other

## 2014-09-24 ENCOUNTER — Encounter (HOSPITAL_COMMUNITY): Payer: Self-pay | Admitting: Vascular Surgery

## 2014-09-24 ENCOUNTER — Encounter (HOSPITAL_COMMUNITY)
Admission: RE | Disposition: A | Discharge status: Home / Self Care | Payer: Self-pay | Source: Ambulatory Visit | Attending: Vascular Surgery

## 2014-09-24 ENCOUNTER — Telehealth: Payer: Self-pay | Admitting: Family Medicine

## 2014-09-24 DIAGNOSIS — Z95828 Presence of other vascular implants and grafts: Secondary | ICD-10-CM | POA: Insufficient documentation

## 2014-09-24 DIAGNOSIS — M79671 Pain in right foot: Secondary | ICD-10-CM | POA: Diagnosis present

## 2014-09-24 DIAGNOSIS — Z87891 Personal history of nicotine dependence: Secondary | ICD-10-CM | POA: Diagnosis not present

## 2014-09-24 DIAGNOSIS — I10 Essential (primary) hypertension: Secondary | ICD-10-CM | POA: Insufficient documentation

## 2014-09-24 DIAGNOSIS — I70229 Atherosclerosis of native arteries of extremities with rest pain, unspecified extremity: Secondary | ICD-10-CM | POA: Diagnosis present

## 2014-09-24 DIAGNOSIS — R0602 Shortness of breath: Secondary | ICD-10-CM | POA: Insufficient documentation

## 2014-09-24 DIAGNOSIS — Z79899 Other long term (current) drug therapy: Secondary | ICD-10-CM | POA: Insufficient documentation

## 2014-09-24 DIAGNOSIS — E785 Hyperlipidemia, unspecified: Secondary | ICD-10-CM | POA: Insufficient documentation

## 2014-09-24 DIAGNOSIS — I2510 Atherosclerotic heart disease of native coronary artery without angina pectoris: Secondary | ICD-10-CM | POA: Diagnosis not present

## 2014-09-24 DIAGNOSIS — Z8673 Personal history of transient ischemic attack (TIA), and cerebral infarction without residual deficits: Secondary | ICD-10-CM | POA: Insufficient documentation

## 2014-09-24 DIAGNOSIS — Z89422 Acquired absence of other left toe(s): Secondary | ICD-10-CM | POA: Insufficient documentation

## 2014-09-24 DIAGNOSIS — I748 Embolism and thrombosis of other arteries: Secondary | ICD-10-CM | POA: Insufficient documentation

## 2014-09-24 DIAGNOSIS — M109 Gout, unspecified: Secondary | ICD-10-CM | POA: Diagnosis not present

## 2014-09-24 DIAGNOSIS — J449 Chronic obstructive pulmonary disease, unspecified: Secondary | ICD-10-CM | POA: Insufficient documentation

## 2014-09-24 DIAGNOSIS — I70221 Atherosclerosis of native arteries of extremities with rest pain, right leg: Secondary | ICD-10-CM

## 2014-09-24 DIAGNOSIS — N400 Enlarged prostate without lower urinary tract symptoms: Secondary | ICD-10-CM | POA: Diagnosis not present

## 2014-09-24 DIAGNOSIS — I739 Peripheral vascular disease, unspecified: Secondary | ICD-10-CM | POA: Insufficient documentation

## 2014-09-24 HISTORY — PX: LOWER EXTREMITY ANGIOGRAM: SHX5508

## 2014-09-24 LAB — POCT I-STAT, CHEM 8
BUN: 46 mg/dL — AB (ref 6–23)
CHLORIDE: 96 mmol/L (ref 96–112)
Calcium, Ion: 1.16 mmol/L (ref 1.13–1.30)
Creatinine, Ser: 1.9 mg/dL — ABNORMAL HIGH (ref 0.50–1.35)
Glucose, Bld: 129 mg/dL — ABNORMAL HIGH (ref 70–99)
HCT: 55 % — ABNORMAL HIGH (ref 39.0–52.0)
Hemoglobin: 18.7 g/dL — ABNORMAL HIGH (ref 13.0–17.0)
Potassium: 2.9 mmol/L — ABNORMAL LOW (ref 3.5–5.1)
SODIUM: 147 mmol/L — AB (ref 135–145)
TCO2: 32 mmol/L (ref 0–100)

## 2014-09-24 SURGERY — ANGIOGRAM, LOWER EXTREMITY
Anesthesia: LOCAL | Laterality: Right

## 2014-09-24 MED ORDER — HEPARIN (PORCINE) IN NACL 2-0.9 UNIT/ML-% IJ SOLN
INTRAMUSCULAR | Status: AC
  Filled 2014-09-24: qty 1000

## 2014-09-24 MED ORDER — SODIUM CHLORIDE 0.9 % IJ SOLN: 3.0000 mL | Freq: Two times a day (BID) | INTRAMUSCULAR | Status: DC

## 2014-09-24 MED ORDER — OXYCODONE-ACETAMINOPHEN 5-325 MG PO TABS: 1.0000 | ORAL_TABLET | Freq: Four times a day (QID) | ORAL | Status: AC | PRN

## 2014-09-24 MED ORDER — LIDOCAINE HCL (PF) 1 % IJ SOLN
INTRAMUSCULAR | Status: AC
  Filled 2014-09-24: qty 30

## 2014-09-24 MED ORDER — POTASSIUM CHLORIDE CRYS ER 20 MEQ PO TBCR
EXTENDED_RELEASE_TABLET | ORAL | Status: AC
  Administered 2014-09-24: 40 meq via ORAL
  Filled 2014-09-24: qty 2

## 2014-09-24 MED ORDER — ACETAMINOPHEN 325 MG PO TABS: 650.0000 mg | ORAL_TABLET | ORAL | Status: DC | PRN

## 2014-09-24 MED ORDER — SODIUM CHLORIDE 0.9 % IV SOLN: 250.0000 mL | INTRAVENOUS | Status: DC | PRN

## 2014-09-24 MED ORDER — POTASSIUM CHLORIDE ER 10 MEQ PO TBCR
40.0000 meq | EXTENDED_RELEASE_TABLET | Freq: Once | ORAL | Status: AC
  Administered 2014-09-24: 40 meq via ORAL
  Filled 2014-09-24: qty 4

## 2014-09-24 MED ORDER — SODIUM CHLORIDE 0.9 % IJ SOLN: 3.0000 mL | INTRAMUSCULAR | Status: DC | PRN

## 2014-09-24 NOTE — Telephone Encounter (Signed)
Was not able to call [facility] before 5 pm.  Will call first thing in the morning.  Pt has appt at Hospice at 10 tomorrow.

## 2014-09-24 NOTE — Telephone Encounter (Signed)
[name] called wanting to get a call back before 5 today  She left message triage line  Pt is discharging [name] system tonight  [name] wants to know if dr Damita Dunnings will be his attending  Would you like symptom management from hospice dr. Abbott [name] has appointment with hospice @ 10 tomorrow

## 2014-09-24 NOTE — Telephone Encounter (Signed)
If he's going on hospice, I'll attend and I want hospice help. Thanks.

## 2014-09-24 NOTE — Progress Notes (Signed)
Referral made to [facility].  [facility] will meet with pt/family at pt's home at 10:00 am on 09/25/14.  Hospice will call pt's home prior to visit.

## 2014-09-24 NOTE — H&P (View-Only) (Signed)
[facility] [place] HISTORY AND PHYSICAL   MRN : [id number]  History of Present Illness:   [name] is a 81 y.o. male patient of Dr. Oneida Alar who is status post left femoropopliteal bypass graft with vein placed May of 2012.  He had left 4th toe amputation in 2012.  He also had EVAR stent placement 08/31/2006 for AAA.  The patient has no history of carotid artery intervention.  He returns today with C/O Right Hip to foot pain, duration 2-3 wks., shortness of breath w/exertion 2 1/2 mos, advised by Dr. Julianne Handler to be evaluated by our practice.  He saw Dr. Damita Dunnings, his PCP, about a month ago, for sudden onset right hip to calf with walking, relieved by rest. Bleeding wound developed in right great toe about 3 days ago when pt peeled off loose skin, great toe was itching also. He was hospitalized in December 2015 at [facility] with CHF, denies MI.  Patient denies recent symptoms of TIA or stroke. He does report a TIA in 2008 after the EVAR as manifested by slurred speech and syncope while in bed, denies amaurosis fugax or monocular blindness, denies facial drooping, denies hemiplegia, denies any further TIA or stroke symptoms.  The patient has not had back, chest, or abdominal pain.   Dr. Elsworth Soho is following a mass on his lung, pt decided not to have a lung bx.   Pt Diabetic: No  Pt smoker: former smoker (quit about May 2015, started in his 20's)  Pt meds include: Statin :No Betablocker: Yes ASA: No, GI intolerance Other anticoagulants/antiplatelets: no    Current Outpatient Prescriptions  Medication Sig Dispense Refill  . acetaminophen (TYLENOL) 325 MG tablet Take 650 mg by mouth every 6 (six) hours as needed for moderate pain (pain).     Marland Kitchen albuterol (PROVENTIL HFA;VENTOLIN HFA) 108 (90 BASE) MCG/ACT inhaler Inhale 2 puffs into the lungs every 4 (four) hours as needed for wheezing. 54 g 3  . ALPRAZolam (XANAX) 0.25 MG tablet Take 1 tablet (0.25  mg total) by mouth 2 (two) times daily as needed for anxiety (sedation caution). 20 tablet 0  . budesonide-formoterol (SYMBICORT) 160-4.5 MCG/ACT inhaler Inhale 2 puffs into the lungs 2 (two) times daily. 3 Inhaler 3  . captopril (CAPOTEN) 50 MG tablet Take 1 tablet (50 mg total) by mouth 2 (two) times daily. 180 tablet 3  . Cholecalciferol (VITAMIN D PO) Take 5,000 Units by mouth daily.    . Coral Calcium 1000 (390 CA) MG TABS Take 1 tablet by mouth daily.    . fenofibrate 160 MG tablet Take 1 tablet (160 mg total) by mouth daily. 90 tablet 3  . furosemide (LASIX) 40 MG tablet Take 1 tablet (40 mg total) by mouth 2 (two) times daily. 60 tablet 6  . metoCLOPramide (REGLAN) 5 MG tablet Take 1 tablet (5 mg total) by mouth daily as needed. 90 tablet 3  . metoprolol (LOPRESSOR) 100 MG tablet Take 1 tablet (100 mg total) by mouth daily. 90 tablet 3  . NON FORMULARY Oxygen 2 liters as needed    . pantoprazole (PROTONIX) 40 MG tablet TAKE 1 TABLET BY MOUTH DAILY 90 tablet 3  . vitamin E 400 UNIT capsule Take 400 Units by mouth daily.    Marland Kitchen zolpidem (AMBIEN) 5 MG tablet Take 1 tablet (5 mg total) by mouth at bedtime as needed for sleep. 15 tablet 0   No current facility-administered medications for this visit.  Past Medical History  Diagnosis Date  . Hyperlipidemia   . Allergy   . COPD (chronic obstructive pulmonary disease)     via X-ray  . BPH (benign prostatic hyperplasia)     Hx of (Dr. Rosana Hoes)  . Gallstones     via CT  . Cigarette smoker   . CVA (cerebral infarction)   . Degeneration of intervertebral disc, site unspecified   . Stricture and stenosis of esophagus   . Unspecified gastritis and gastroduodenitis without mention of hemorrhage   . Duodenitis without mention of hemorrhage   . Benign neoplasm of colon   . Diverticulosis of colon (without mention of hemorrhage)   . Abdominal aneurysm without mention of rupture     followed by [facility] (Dr. Oneida Alar)  . Gout, unspecified   .  Personal history of urinary calculi   . Hyperglycemia   . Low back pain   . Nausea   . Carotid arterial disease 08/2010    on [place] 60-80% L ICA and <40% R ICA  . PAD (peripheral artery disease)   . Hypertension   . Stroke 2008    Mini Stroke    Social History History  Substance Use Topics  . Smoking status: Former Smoker -- 1.00 packs/day for 50 years    Types: Cigarettes    Quit date: 08/26/2013  . Smokeless tobacco: Never Used  . Alcohol Use: No    Family History Family History  Problem Relation Age of Onset  . Heart disease Mother     MI  . Hypertension Mother   . Varicose Veins Mother   . Heart attack Mother   . Aneurysm Father   . Diabetes Neg Hx   . Arthritis Neg Hx   . Depression Neg Hx   . Alcohol abuse Neg Hx   . Drug abuse Neg Hx   . Colon cancer Neg Hx   . Prostate cancer Neg Hx   . Cancer Brother     Lung  . Heart attack Brother   . Heart disease Brother     MI    Surgical History Past Surgical History  Procedure Laterality Date  . Inguinal hernia repair  03/21/2002    Bilateral  . Esophagogastroduodenoscopy  12/15/2004    Duodenitis, gastritis, esoph stricture, path neg  . Ct of abdomen  10/09/2004    5.1 cm AAA gallstone; 7 cm. stone in the pole of the right kidney  . Ct of pelvis  10/09/2004    Normal except for divertic of bladder  . [place] of aaa  07/06/2005    Unchanged 4.9 cm. (Dr. Amedeo Plenty)  . [place] of aaa  12/24/2005    5.2 cm  . [place] of abdomen  06/24/2006    5.5 cm  . Adenosine myoview  07/13/2007    Normal  . Abdominal aortogram  07/29/2006    Infrafenal abdominal aortic aneurysm with total occulision of inferior mesenteric artery  . Abdomen ct of angio  07/22/2006    Right kidney stone, L4/5/S1 DDD; AAA 5.3 cm. gallstones  . Aaa stent graft  08/31/2006    Right artery repair -- S/P Post op CVA  . Ct of head  09/01/2006    Small vessel changes  . Pre-surgery carotid US      Mod right ICA; mild left ICA stenosis  . Mri brain  09/01/2006     Normal  . Abdominal US  11/18/2006    Normal  . Toe amputation  L 4th 2012  . Femoral-popliteal bypass graft      left, 2012  . Cataract extraction      2012, bilateral  . Eye surgery      Allergies  Allergen Reactions  . Aspirin Nausea Only and Other (See Comments)    REACTION: GI intolerance  . Sulfonamide Derivatives Nausea Only    REACTION: nausea    Current Outpatient Prescriptions  Medication Sig Dispense Refill  . acetaminophen (TYLENOL) 325 MG tablet Take 650 mg by mouth every 6 (six) hours as needed for moderate pain (pain).     Marland Kitchen albuterol (PROVENTIL HFA;VENTOLIN HFA) 108 (90 BASE) MCG/ACT inhaler Inhale 2 puffs into the lungs every 4 (four) hours as needed for wheezing. 54 g 3  . ALPRAZolam (XANAX) 0.25 MG tablet Take 1 tablet (0.25 mg total) by mouth 2 (two) times daily as needed for anxiety (sedation caution). 20 tablet 0  . budesonide-formoterol (SYMBICORT) 160-4.5 MCG/ACT inhaler Inhale 2 puffs into the lungs 2 (two) times daily. 3 Inhaler 3  . captopril (CAPOTEN) 50 MG tablet Take 1 tablet (50 mg total) by mouth 2 (two) times daily. 180 tablet 3  . Cholecalciferol (VITAMIN D PO) Take 5,000 Units by mouth daily.    . Coral Calcium 1000 (390 CA) MG TABS Take 1 tablet by mouth daily.    . fenofibrate 160 MG tablet Take 1 tablet (160 mg total) by mouth daily. 90 tablet 3  . furosemide (LASIX) 40 MG tablet Take 1 tablet (40 mg total) by mouth 2 (two) times daily. 60 tablet 6  . metoCLOPramide (REGLAN) 5 MG tablet Take 1 tablet (5 mg total) by mouth daily as needed. 90 tablet 3  . metoprolol (LOPRESSOR) 100 MG tablet Take 1 tablet (100 mg total) by mouth daily. 90 tablet 3  . NON FORMULARY Oxygen 2 liters as needed    . pantoprazole (PROTONIX) 40 MG tablet TAKE 1 TABLET BY MOUTH DAILY 90 tablet 3  . vitamin E 400 UNIT capsule Take 400 Units by mouth daily.    Marland Kitchen zolpidem (AMBIEN) 5 MG tablet Take 1 tablet (5 mg total) by mouth at bedtime as needed for sleep. 15 tablet 0    No current facility-administered medications for this visit.     REVIEW OF SYSTEMS: See HPI for pertinent positives and negatives.  Physical Examination Filed Vitals:   09/20/14 1054  BP: 116/69  Pulse: 54  Resp: 14  Height: 6\' 4"  (1.93 m)  Weight: 180 lb (81.647 kg)  SpO2: 91%   Body mass index is 21.92 kg/(m^2).  General: WDWN in NAD  Gait: limp  HENT: WNL  Eyes: Pupils equal  Pulmonary: slightly labored breathing at rest , + Rales left posterior fields, - rhonchi, - wheezes Cardiac: RRR, positive for murmer   Abdomen: soft, NT, no palpable masses  Skin: several AK's on forearms/hands, see extremities; positive for multiple small varicoities in ankles.    VASCULAR EXAM  Carotid Bruits  Left  Right    Negative  Negative    Aorta is not  palpable Radial pulses are 2+ palpable    VASCULAR EXAM:  Extremities with ischemic changes: dark lateral edges of 4th and 5th right toes, tip of right great toe is denuded of dermal layer, red moist tissue exposed, left 4th toe surgically absent.   Positive for 2-3+ bilateral pretibial pitting edema.   LE Pulses  LEFT  RIGHT   FEMORAL  palpable  palpable   POPLITEAL  Not palpable  Not palpable   POSTERIOR TIBIAL  not palpable  not palpable   DORSALIS PEDIS  ANTERIOR TIBIAL  not palpable  not palpable    Musculoskeletal: no muscle wasting or atrophy; left 4th toe surgically absent.  Neurologic: A&O X 3; Appropriate Affect ; SENSATION: normal; MOTOR FUNCTION: 5/5 Symmetric, CN 2-12 intact, Speech is fluent/normal.   Carotid Duplex on 05/25/14: 1. Less than 40% right internal carotid artery stenosis 2. 60 - 79% left internal carotid artery stenosis.  Non-Invasive Vascular Imaging (09/20/2014):  ABI (Date: 09/20/2014)  R: absent (05/25/14, 0.72), DP: absent, PT: absent, TBI: 0  L: 1.11 (05/25/14, 1.07), DP: biphasic, PT: triphasic, TBI: 0.61    ASSESSMENT:  [name] is a 81  y.o. male  who is status post left femoropopliteal bypass graft with vein placed May of 2012.  He had left 4th toe amputation in 2012.  He also had EVAR stent placement 08/31/2006 for AAA.  As best can be determined, about 2-4 weeks ago pt started having pain from right hips to calf with walking, relieved by rest. 3 days ago patient denuded his right great toe tip by pulling off loose skin.  Uncertain as to when patient developed dark lateral edges of right 4th and 5th toes. ABI's today are absent in right ankle and foot, no Doppler waveforms in right ankle and foot, pulses are absent in right foot with tissue loss in right first, fourth, and fifth toes. Critical right leg ischemia.  Plan arteriogram, see Plan.  Dr. Oneida Alar spoke with patient's cardiologist Dr. Julianne Handler.   PLAN:   Based on today's exam and non-invasive vascular lab results, and after Dr. Oneida Alar spoke with patient and wife, and examined patient, schedule arteriogram on 09/25/14 with Dr. Bridgett Larsson.   Clemon Chambers, RN, MSN, FNP-C [facility] Office: [phone]  Clinic MD: [facility] 09/20/2014 11:06 AM

## 2014-09-24 NOTE — Progress Notes (Signed)
Discussed arteriogram findings with patient and family.  Due to his underlying comorbidities would only offer him right above knee amputation at this point.  In light of the patient's presumed lung CA for which he does not wish treatment, his underlying severe CAD and COPD and his wish to not lose his legs.  He has opted for palliative care at this point.    Will see if we can get palliative care consult prior to d/c today or possibly admit overnight if necessary.  If he goes home today will d/c on Percocet for pain control.  Ruta Hinds, MD [facility] [place] Office: [phone] Pager: [phone]

## 2014-09-24 NOTE — Interval H&P Note (Signed)
[facility] [place]  History and Physical Update  The patient was interviewed and re-examined.  The patient's previous History and Physical has been reviewed and is unchanged from Dr. Nona Dell consult.  There is no change in the plan of care: Aortogram andright leg runoff.  Pt's Cr is elevated today, so procedure will be done with Carbon dioxide also to limit contrast use.  As the patient has had a EVAR, I would keep with R femoral cannulation as crossing the bifurcation will not be possible.  I discussed with the patient the nature of angiographic procedures, especially the limited patencies of any endovascular intervention.  The patient is aware of that the risks of an angiographic procedure include but are not limited to: bleeding, infection, access site complications, renal failure, embolization, rupture of vessel, dissection, possible need for emergent surgical intervention, possible need for surgical procedures to treat the patient's pathology, anaphylactic reaction to contrast, and stroke and death.  The patient is aware of the risks and agrees to proceed.   Adele Barthel, MD [facility] [place] Office: [phone] Pager: [phone]  09/24/2014, 11:18 AM

## 2014-09-24 NOTE — Discharge Instructions (Signed)

## 2014-09-24 NOTE — Progress Notes (Signed)
Client had his wife dress him and he was up in wheelchair; I notified Dr Oneida Alar and he asked me to advise client he would like for him to stay for the 4 hours bedrest and client advised

## 2014-09-24 NOTE — Op Note (Addendum)
   OPERATIVE NOTE   PROCEDURE: 1.  Right common femoral artery cannulation under ultrasound guidance 2.  Right leg runoff  PRE-OPERATIVE DIAGNOSIS: right foot rest pain  POST-OPERATIVE DIAGNOSIS: same as above   SURGEON: Adele Barthel, MD  ANESTHESIA: conscious sedation  ESTIMATED BLOOD LOSS: 50 cc  CONTRAST: 35 cc  FINDING(S): Patent right common femoral artery and profunda femoral artery (medial and lateral branches) Right superficial femoral artery is proximally patent but occludes from mid-segment on with a few collaterals feeding a reconstituted peroneal artery  Right peroneal artery gives off a posterior tibial artery with no blood flow into mid-foot evident  SPECIMEN(S):  none  INDICATIONS:   [name] is a 81 y.o. male who presents with right leg ischemia and rest pain.  The patient has previously had an EVAR completed, so I discussed with the patient that no intervention was likely, so the plan was to cannulate the right leg and complete the runoff via the sheath.  As this patient's creatinine is worsened today, I elected to avoid the aortogram and pelvic injections.  The patient presents for: right femoral cannulation and right leg runoff.  I discussed with the patient the nature of angiographic procedures, especially the limited patencies of any endovascular intervention.  The patient is aware of that the risks of an angiographic procedure include but are not limited to: bleeding, infection, access site complications, renal failure, embolization, rupture of vessel, dissection, possible need for emergent surgical intervention, possible need for surgical procedures to treat the patient's pathology, and stroke and death.  The patient is aware of the risks and agrees to proceed.  DESCRIPTION: After full informed consent was obtained from the patient, the patient was brought back to the angiography suite.  The patient was placed supine upon the angiography table and connected  to monitoring equipment.  The patient was desaturating down to 88% on 2 L/hr oxygen via nasal cannula, so I felt he was NOT a candidate for conscious sedationThe patient was prepped and drape in the standard fashion for an angiographic procedure.  At this point, attention was turned to the right groin.  Under ultrasound guidance, the right common femoral artery was anesthesize and then cannulated with a 18 gauge needle.  The [facility] wire was passed up into the aorta.  The needle was exchanged for a 5-Fr sheath, which was advanced over the wire into the common femoral artery.  The dilator was then removed.  The sheath was connected the power injector circuit.  An automated right leg runoff was completed.   The findings are listed above.  The sheath was aspirated.  No clots were present and the sheath was reloaded with heparinized saline.    Based on the images, he will need a right above-knee amputation.  His pulmonary status is too tenuous to consider any vascular reconstruction, as I suspect he would be chronically ventilator dependent if he required general anesthesia.  Note, no post-procedure intravenous fluid will be administered due to his tenuous pulmonary status.  COMPLICATIONS: none  CONDITION: stable  Adele Barthel, MD [facility] [place] Office: [phone] Pager: [phone]  09/24/2014, 1:53 PM

## 2014-09-25 NOTE — Telephone Encounter (Signed)
Yvette advised.

## 2014-09-30 ENCOUNTER — Encounter: Payer: Self-pay | Admitting: Family Medicine

## 2014-09-30 DIAGNOSIS — Z515 Encounter for palliative care: Secondary | ICD-10-CM | POA: Insufficient documentation

## 2014-10-01 ENCOUNTER — Other Ambulatory Visit: Payer: Self-pay | Admitting: Family Medicine

## 2014-10-01 NOTE — Telephone Encounter (Signed)
Please call in.  Thanks.   

## 2014-10-01 NOTE — Telephone Encounter (Signed)
Electronic refill request. Last Filled:    20 tablet 0 RF on 08/30/2014  Please advise.

## 2014-10-02 NOTE — Telephone Encounter (Signed)
Medication phoned to pharmacy.  

## 2014-10-05 ENCOUNTER — Other Ambulatory Visit: Payer: Self-pay | Admitting: *Deleted

## 2014-10-05 MED ORDER — ZOLPIDEM TARTRATE 5 MG PO TABS: 5.0000 mg | ORAL_TABLET | Freq: Every evening | ORAL | Status: DC | PRN

## 2014-10-05 NOTE — Telephone Encounter (Signed)
Faxed refill request. Last Filled:    15 tablet 0 RF on 07/27/2014  Please advise.

## 2014-10-05 NOTE — Telephone Encounter (Signed)
Please call in.  Thanks.   

## 2014-10-05 NOTE — Telephone Encounter (Signed)
Medication phoned to pharmacy.  

## 2014-10-10 ENCOUNTER — Telehealth: Payer: Self-pay | Admitting: Family Medicine

## 2014-10-10 MED ORDER — ZOLPIDEM TARTRATE 5 MG PO TABS: 10.0000 mg | ORAL_TABLET | Freq: Every evening | ORAL | Status: DC | PRN

## 2014-10-10 MED ORDER — MORPHINE SULFATE (CONCENTRATE) 20 MG/ML PO SOLN: ORAL | Status: DC

## 2014-10-10 NOTE — Telephone Encounter (Signed)
[name] @ hospice call [name] has not slept in 2 days and he got 5mg  ambien  On Monday woke up @ 4am and has not slept since. Can they up to 10mg    FYI This patient is getting  roxanol 20mg  per 1 mil    Pt can get 5-10 mg as need every 4 hours  [name] would like to know ASAP

## 2014-10-10 NOTE — Telephone Encounter (Signed)
Onyeje with Hospice notified as instructed by telephone and verbalized understanding. Onyeje stated that patient will need new script for Ambien since he is going to be taking more. Pharmacy [facility]

## 2014-10-10 NOTE — Telephone Encounter (Signed)
Please give the okay.  If new rxs needed, then let me know.   ambien 10mg  qhs prn insomnia.  roxanol 20mg /ml.  Take 5-10 mg PO/SL as need every 4 hours for pain or SOB.  Thanks.

## 2014-10-11 MED ORDER — ZOLPIDEM TARTRATE 10 MG PO TABS: 10.0000 mg | ORAL_TABLET | Freq: Every evening | ORAL | Status: AC | PRN

## 2014-10-11 NOTE — Telephone Encounter (Signed)
Medication phoned to pharmacy.  

## 2014-10-11 NOTE — Telephone Encounter (Signed)
Please call in, I changed the dose.  Thanks.

## 2014-10-16 ENCOUNTER — Encounter: Payer: Self-pay | Admitting: Internal Medicine

## 2014-10-16 ENCOUNTER — Telehealth: Payer: Self-pay

## 2014-10-16 ENCOUNTER — Ambulatory Visit (INDEPENDENT_AMBULATORY_CARE_PROVIDER_SITE_OTHER): Payer: Medicare Other | Admitting: Internal Medicine

## 2014-10-16 VITALS — BP 98/50 | HR 68 | Temp 97.5°F | Wt 165.0 lb

## 2014-10-16 DIAGNOSIS — K121 Other forms of stomatitis: Secondary | ICD-10-CM

## 2014-10-16 DIAGNOSIS — I255 Ischemic cardiomyopathy: Secondary | ICD-10-CM

## 2014-10-16 MED ORDER — MAGIC MOUTHWASH W/LIDOCAINE: 5.0000 mL | Freq: Four times a day (QID) | ORAL | Status: AC

## 2014-10-16 NOTE — Telephone Encounter (Signed)
Phone call rec'd from pt's wife.  Wife stated the pt. is now on Hospice.  Requesting an appt. To have Dr. Oneida Alar check his right foot.  Reported the skin is coming off the toes and there is sores between the toes with intermittent bleeding.  Wife became tearful, and said if someone could look at his right foot and tell me how I should care for it.  Referred to the recent Angiogram and recommendation for a right above knee amputation.  Wife stated the pt. cannot make that decision.  Wife requested to have an appt. To further direct her on caring for his foot.  Advised will call back with an appt.

## 2014-10-16 NOTE — Patient Instructions (Signed)
Oral Ulcers Oral ulcers are painful, shallow sores around the lining of the mouth. They can affect the gums, the inside of the lips, and the cheeks. (Sores on the outside of the lips and on the face are different.) They typically first occur in school-aged children and teenagers. Oral ulcers may also be called canker sores or cold sores. CAUSES  Canker sores and cold sores can be caused by many factors including:  Infection.  Injury.  Sun exposure.  Medications.  Emotional stress.  Food allergies.  Vitamin deficiencies.  Toothpastes containing sodium lauryl sulfate. The herpes virus can be the cause of mouth ulcers. The first infection can be severe and cause 10 or more ulcers on the gums, tongue, and lips with fever and difficulty in swallowing. This infection usually occurs between the ages of 57 and 3 years.  SYMPTOMS  The typical sore is about  inch (6 mm) in size and is an oval or round ulcer with red borders. DIAGNOSIS  Your caregiver can diagnose simple oral ulcers by examination. Additional testing is usually not required.  TREATMENT  Treatment is aimed at pain relief. Generally, oral ulcers resolve by themselves within 1 to 2 weeks without medication and are not contagious unless caused by herpes (and other viruses). Antibiotics are not effective with mouth sores. Avoid direct contact with others until the ulcer is completely healed. See your caregiver for follow-up care as recommended. Also:  Offer a soft diet.  Encourage plenty of fluids to prevent dehydration. Popsicles and milk shakes can be helpful.  Avoid acidic and salty foods and drinks such as orange juice.  Infants and young children will often refuse to drink because of pain. Using a teaspoon, cup, or syringe to give small amounts of fluids frequently can help prevent dehydration.  Cold compresses on the face may help reduce pain.  Pain medication can help control soreness.  A solution of diphenhydramine  mixed with a liquid antacid can be useful to decrease the soreness of ulcers. Consult a caregiver for the dosing.  Liquids or ointments with a numbing ingredient may be helpful when used as recommended.  Older children and teenagers can rinse their mouth with a salt-water mixture (1/2 teaspoon of salt in 8 ounces of water) four times a day. This treatment is uncomfortable but may reduce the time the ulcers are present.  There are many over-the-counter throat lozenges and medications available for oral ulcers. Their effectiveness has not been studied.  Consult your medical caregiver prior to using homeopathic treatments for oral ulcers. SEEK MEDICAL CARE IF:   You think your child needs to be seen.  The pain worsens and you cannot control it.  There are 4 or more ulcers.  The lips and gums begin to bleed and crust.  A single mouth ulcer is near a tooth that is causing a toothache or pain.  Your child has a fever, swollen face, or swollen glands.  The ulcers began after starting a medication.  Mouth ulcers keep reoccurring or last more than 2 weeks.  You think your child is not taking adequate fluids. SEEK IMMEDIATE MEDICAL CARE IF:   Your child has a high fever.  Your child is unable to swallow or becomes dehydrated.  Your child looks or acts very ill.  An ulcer caused by a chemical your child accidentally put in their mouth. Document Released: 09/10/2004 Document Revised: 12/18/2013 Document Reviewed: 04/25/2009 [facility] Patient Information 2015 [place], [place]. This information is not intended to replace advice  to you by your health care provider. Make sure you discuss any questions you have with your health care provider.  

## 2014-10-16 NOTE — Progress Notes (Signed)
Subjective:    Patient ID: [name], male    DOB: [birth year], 81 y.o.   MRN: [id number]  HPI  Pt presents to the clinic today with c/o ulcers on his tongue. He noticed this 1 week ago. The ulcers are painful but he is not having any difficulty swallowing. He has not tried anything OTC. He is not undergoing any chemo or radiation. He is on hospice care.  Review of Systems      Past Medical History  Diagnosis Date  . Hyperlipidemia   . Allergy   . COPD (chronic obstructive pulmonary disease)     via X-ray  . BPH (benign prostatic hyperplasia)     Hx of (Dr. Rosana Hoes)  . Gallstones     via CT  . Cigarette smoker   . CVA (cerebral infarction)   . Degeneration of intervertebral disc, site unspecified   . Stricture and stenosis of esophagus   . Unspecified gastritis and gastroduodenitis without mention of hemorrhage   . Duodenitis without mention of hemorrhage   . Benign neoplasm of colon   . Diverticulosis of colon (without mention of hemorrhage)   . Abdominal aneurysm without mention of rupture     followed by [facility] (Dr. Oneida Alar)  . Gout, unspecified   . Personal history of urinary calculi   . Hyperglycemia   . Low back pain   . Nausea   . Carotid arterial disease 08/2010    on [place] 60-80% L ICA and <40% R ICA  . PAD (peripheral artery disease)   . Hypertension   . Stroke 2008    Mini Stroke    Current Outpatient Prescriptions  Medication Sig Dispense Refill  . acetaminophen (TYLENOL) 325 MG tablet Take 650 mg by mouth every 6 (six) hours as needed for moderate pain (pain).     Marland Kitchen albuterol (PROVENTIL HFA;VENTOLIN HFA) 108 (90 BASE) MCG/ACT inhaler Inhale 2 puffs into the lungs every 4 (four) hours as needed for wheezing. 54 g 3  . ALPRAZolam (XANAX) 0.25 MG tablet TAKE 1 TABLET TWICE A DAY AS NEEDED FOR ANXIETY 20 tablet 3  . budesonide-formoterol (SYMBICORT) 160-4.5 MCG/ACT inhaler Inhale 2 puffs into the lungs 2 (two) times daily. 3 Inhaler 3  . captopril (CAPOTEN) 50  MG tablet Take 1 tablet (50 mg total) by mouth 2 (two) times daily. 180 tablet 3  . Cholecalciferol (VITAMIN D PO) Take 5,000 Units by mouth daily.    . Coral Calcium 1000 (390 CA) MG TABS Take 1 tablet by mouth daily.    . fenofibrate 160 MG tablet Take 1 tablet (160 mg total) by mouth daily. 90 tablet 3  . furosemide (LASIX) 40 MG tablet Take 1 tablet (40 mg total) by mouth 2 (two) times daily. 60 tablet 6  . metoCLOPramide (REGLAN) 5 MG tablet Take 1 tablet (5 mg total) by mouth daily as needed. 90 tablet 3  . metoprolol (LOPRESSOR) 100 MG tablet Take 1 tablet (100 mg total) by mouth daily. 90 tablet 3  . morphine (ROXANOL) 20 MG/ML concentrated solution Take 5-10 mg (0.25-0.49mL) PO/SL as need every 4 hours for pain or SOB. Hospice patient.  0  . NON FORMULARY Oxygen 2 liters as needed    . oxyCODONE-acetaminophen (PERCOCET/ROXICET) 5-325 MG per tablet Take 1-2 tablets by mouth every 6 (six) hours as needed. 60 tablet 0  . pantoprazole (PROTONIX) 40 MG tablet TAKE 1 TABLET BY MOUTH DAILY 90 tablet 3  . vitamin E 400 UNIT capsule Take  400 Units by mouth daily.    Marland Kitchen zolpidem (AMBIEN) 10 MG tablet Take 1 tablet (10 mg total) by mouth at bedtime as needed for sleep. 30 tablet 2   No current facility-administered medications for this visit.    Allergies  Allergen Reactions  . Aspirin Nausea Only and Other (See Comments)    REACTION: GI intolerance  . Sulfonamide Derivatives Nausea Only    REACTION: nausea    Family History  Problem Relation Age of Onset  . Heart disease Mother     MI  . Hypertension Mother   . Varicose Veins Mother   . Heart attack Mother   . Aneurysm Father   . Diabetes Neg Hx   . Arthritis Neg Hx   . Depression Neg Hx   . Alcohol abuse Neg Hx   . Drug abuse Neg Hx   . Colon cancer Neg Hx   . Prostate cancer Neg Hx   . Cancer Brother     Lung  . Heart attack Brother   . Heart disease Brother     MI    History   Social History  . Marital Status:  Married    Spouse Name: N/A  . Number of Children: 1  . Years of Education: N/A   Occupational History  . [employer] and [place] History Main Topics  . Smoking status: Former Smoker -- 1.00 packs/day for 50 years    Types: Cigarettes    Quit date: 08/26/2013  . Smokeless tobacco: Never Used  . Alcohol Use: No  . Drug Use: No  . Sexual Activity: Not on file   Other Topics Concern  . Not on file   Social History Narrative   Remarried 2008, lives with wife.   1 daughter in [place].   Likes to travel.     Constitutional: Denies fever, malaise, fatigue, headache or abrupt weight changes.  HEENT: Pt reports ulcers on his tongue. Denies eye pain, eye redness, ear pain, ringing in the ears, wax buildup, runny nose, nasal congestion, bloody nose, or sore throat.  No other specific complaints in a complete review of systems (except as listed in HPI above).  Objective:   Physical Exam   BP 98/50 mmHg  Pulse 68  Temp(Src) 97.5 F (36.4 C) (Oral)  Wt 165 lb (74.844 kg) Wt Readings from Last 3 Encounters:  10/16/14 165 lb (74.844 kg)  09/24/14 174 lb (78.926 kg)  09/20/14 180 lb (81.647 kg)    General: Appears his stated age, ill appearing in NAD. Skin: Pallor noted HEENT: Throat/Mouth: Teeth absent, mucosa pink and moist, small oral ulceration noted on right cheek.  Neck: No adenopathy noted Cardiovascular: Normal rate and rhythm. S1,S2 noted.  No murmur, rubs or gallops noted.  Pulmonary/Chest: Normal effort and diminshed breath sounds. No respiratory distress. No wheezes, rales or ronchi noted.   BMET    Component Value Date/Time   NA 147* 09/24/2014 1032   K 2.9* 09/24/2014 1032   CL 96 09/24/2014 1032   CO2 34* 09/12/2014 1724   GLUCOSE 129* 09/24/2014 1032   BUN 46* 09/24/2014 1032   CREATININE 1.90* 09/24/2014 1032   CREATININE 2.01* 08/03/2014 1512   CALCIUM 9.6 09/12/2014 1724   GFRNONAA 53* 07/26/2014 0405    GFRAA 61* 07/26/2014 0405    Lipid Panel     Component Value Date/Time   CHOL 205* 03/23/2013 0819   TRIG 194.0* 03/23/2013 9449  HDL 30.20* 03/23/2013 0819   CHOLHDL 7 03/23/2013 0819   VLDL 38.8 03/23/2013 0819   LDLCALC 103* 12/14/2011 0827    CBC    Component Value Date/Time   WBC 7.9 09/12/2014 1724   RBC 4.73 09/12/2014 1724   HGB 18.7* 09/24/2014 1032   HCT 55.0* 09/24/2014 1032   PLT 199.0 09/12/2014 1724   MCV 91.8 09/12/2014 1724   MCH 28.8 07/24/2014 0501   MCHC 32.2 09/12/2014 1724   RDW 19.8* 09/12/2014 1724   LYMPHSABS 0.9 09/12/2014 1724   MONOABS 0.3 09/12/2014 1724   EOSABS 0.0 09/12/2014 1724   BASOSABS 0.0 09/12/2014 1724    Hgb A1C No results found for: HGBA1C      Assessment & Plan:   Oral Ulcers:  eRx for Magic Mouthwash with Lidocaine Salt water gargles may also help- but not sure if this is something he is able to do given his current status  RTC as needed or if symptoms persist or worse

## 2014-10-16 NOTE — Telephone Encounter (Signed)
Left message for patients wife on home # to call back and confirm appt, dpm

## 2014-10-16 NOTE — Progress Notes (Signed)
Pre visit review using our clinic review tool, if applicable. No additional management support is needed unless otherwise documented below in the visit note. 

## 2014-10-18 ENCOUNTER — Other Ambulatory Visit: Payer: Self-pay

## 2014-10-18 ENCOUNTER — Encounter: Payer: Self-pay | Admitting: Family

## 2014-10-18 MED ORDER — MORPHINE SULFATE (CONCENTRATE) 20 MG/ML PO SOLN: ORAL | Status: AC

## 2014-10-18 NOTE — Telephone Encounter (Signed)
Faxed to pharmacy, [facility] [address].  [facility] with Hospice advised.

## 2014-10-18 NOTE — Telephone Encounter (Signed)
Signed, please fax over.  Thanks.

## 2014-10-18 NOTE — Telephone Encounter (Signed)
Onyeje with [facility] request morphine rx faxed to [facility] [address]. Pt is almost out of med and request done today before 5 PM. Onyeje request cb.

## 2014-10-19 ENCOUNTER — Ambulatory Visit (INDEPENDENT_AMBULATORY_CARE_PROVIDER_SITE_OTHER): Admitting: Family

## 2014-10-19 ENCOUNTER — Encounter: Payer: Self-pay | Admitting: Family

## 2014-10-19 VITALS — BP 122/50 | HR 56 | Resp 14 | Ht 76.0 in | Wt 165.0 lb

## 2014-10-19 DIAGNOSIS — L24A9 Irritant contact dermatitis due friction or contact with other specified body fluids: Secondary | ICD-10-CM | POA: Insufficient documentation

## 2014-10-19 DIAGNOSIS — I96 Gangrene, not elsewhere classified: Secondary | ICD-10-CM

## 2014-10-19 DIAGNOSIS — I998 Other disorder of circulatory system: Secondary | ICD-10-CM

## 2014-10-19 DIAGNOSIS — M79671 Pain in right foot: Secondary | ICD-10-CM | POA: Diagnosis not present

## 2014-10-19 DIAGNOSIS — I255 Ischemic cardiomyopathy: Secondary | ICD-10-CM

## 2014-10-19 DIAGNOSIS — I739 Peripheral vascular disease, unspecified: Secondary | ICD-10-CM | POA: Diagnosis not present

## 2014-10-19 DIAGNOSIS — I6523 Occlusion and stenosis of bilateral carotid arteries: Secondary | ICD-10-CM

## 2014-10-19 DIAGNOSIS — M79609 Pain in unspecified limb: Secondary | ICD-10-CM | POA: Insufficient documentation

## 2014-10-19 DIAGNOSIS — T148XXA Other injury of unspecified body region, initial encounter: Secondary | ICD-10-CM | POA: Insufficient documentation

## 2014-10-19 DIAGNOSIS — Z95828 Presence of other vascular implants and grafts: Secondary | ICD-10-CM

## 2014-10-19 DIAGNOSIS — I70229 Atherosclerosis of native arteries of extremities with rest pain, unspecified extremity: Secondary | ICD-10-CM

## 2014-10-19 NOTE — Progress Notes (Signed)
[facility] [place] HISTORY AND PHYSICAL -PAD  History of Present Illness [name] is a 81 y.o. male patient of Dr. Oneida Alar who is status post left femoropopliteal bypass graft with vein placed May of 2012. He had left 4th toe amputation in 2012.  He also had EVAR stent placement 08/31/2006 for AAA. He had an arteriogram with run off by Dr. Bridgett Larsson on 09/24/14, it was determined from this that he will need a right AKA as his lung function will not tolerate general anesthesia needed for vascular reconstruction. He returns today as his wife wants direction as to how to care for right foot, pt is on hospice; wife states she was told by Dr. Oneida Alar that he is not a candidate for amputation as he cannot tolerate general anesthesia, but wife is basing this on her conversation with Dr. Oneida Alar before the arteriogram.  He does receive morphine for his right foot pain.  The patient has no history of carotid artery intervention.   He was hospitalized in December 2015 at [facility] with CHF, denies MI.  Patient denies recent symptoms of TIA or stroke. He does report a TIA in 2008 after the EVAR as manifested by slurred speech and syncope while in bed, denies amaurosis fugax or monocular blindness, denies facial drooping, denies hemiplegia, denies any further TIA or stroke symptoms.  The patient has not had back, chest, or abdominal pain.   Dr. Elsworth Soho is following a mass on his lung, pt decided not to have a lung bx.   Pt Diabetic: No  Pt smoker: former smoker (quit about May 2015, started in his 20's)  Pt meds include: Statin :No Betablocker: Yes ASA: No, GI intolerance Other anticoagulants/antiplatelets: no     Past Medical History  Diagnosis Date  . Hyperlipidemia   . Allergy   . COPD (chronic obstructive pulmonary disease)     via X-ray  . BPH (benign prostatic hyperplasia)     Hx of (Dr. Rosana Hoes)  . Gallstones     via CT  . Cigarette smoker   . CVA  (cerebral infarction)   . Degeneration of intervertebral disc, site unspecified   . Stricture and stenosis of esophagus   . Unspecified gastritis and gastroduodenitis without mention of hemorrhage   . Duodenitis without mention of hemorrhage   . Benign neoplasm of colon   . Diverticulosis of colon (without mention of hemorrhage)   . Abdominal aneurysm without mention of rupture     followed by [facility] (Dr. Oneida Alar)  . Gout, unspecified   . Personal history of urinary calculi   . Hyperglycemia   . Low back pain   . Nausea   . Carotid arterial disease 08/2010    on [place] 60-80% L ICA and <40% R ICA  . PAD (peripheral artery disease)   . Hypertension   . Stroke 2008    Mini Stroke    Social History History  Substance Use Topics  . Smoking status: Former Smoker -- 1.00 packs/day for 50 years    Types: Cigarettes    Quit date: 08/26/2013  . Smokeless tobacco: Never Used  . Alcohol Use: No    Family History Family History  Problem Relation Age of Onset  . Heart disease Mother     MI  . Hypertension Mother   . Varicose Veins Mother   . Heart attack Mother   . Aneurysm Father   . Diabetes Neg Hx   . Arthritis Neg Hx   .  Depression Neg Hx   . Alcohol abuse Neg Hx   . Drug abuse Neg Hx   . Colon cancer Neg Hx   . Prostate cancer Neg Hx   . Cancer Brother     Lung  . Heart attack Brother   . Heart disease Brother     MI    Past Surgical History  Procedure Laterality Date  . Inguinal hernia repair  03/21/2002    Bilateral  . Esophagogastroduodenoscopy  12/15/2004    Duodenitis, gastritis, esoph stricture, path neg  . Ct of abdomen  10/09/2004    5.1 cm AAA gallstone; 7 cm. stone in the pole of the right kidney  . Ct of pelvis  10/09/2004    Normal except for divertic of bladder  . [place] of aaa  07/06/2005    Unchanged 4.9 cm. (Dr. Amedeo Plenty)  . [place] of aaa  12/24/2005    5.2 cm  . [place] of abdomen  06/24/2006    5.5 cm  . Adenosine myoview  07/13/2007    Normal  . Abdominal aortogram   07/29/2006    Infrafenal abdominal aortic aneurysm with total occulision of inferior mesenteric artery  . Abdomen ct of angio  07/22/2006    Right kidney stone, L4/5/S1 DDD; AAA 5.3 cm. gallstones  . Aaa stent graft  08/31/2006    Right artery repair -- S/P Post op CVA  . Ct of head  09/01/2006    Small vessel changes  . Pre-surgery carotid US      Mod right ICA; mild left ICA stenosis  . Mri brain  09/01/2006    Normal  . Abdominal US  11/18/2006    Normal  . Toe amputation      L 4th 2012  . Femoral-popliteal bypass graft      left, 2012  . Cataract extraction      2012, bilateral  . Eye surgery    . Lower extremity angiogram Right 09/24/2014    Procedure: LOWER EXTREMITY ANGIOGRAM;  Surgeon: Conrad Centerville, MD;  Location: [facility] CATH LAB;  Service: Cardiovascular;  Laterality: Right;    Allergies  Allergen Reactions  . Aspirin Nausea Only and Other (See Comments)    REACTION: GI intolerance  . Sulfonamide Derivatives Nausea Only    REACTION: nausea    Current Outpatient Prescriptions  Medication Sig Dispense Refill  . furosemide (LASIX) 40 MG tablet Take 1 tablet (40 mg total) by mouth 2 (two) times daily. 60 tablet 6  . morphine (ROXANOL) 20 MG/ML concentrated solution Take 5-10 mg (0.25-0.4mL) PO/SL as need every 4 hours for pain or SOB. Hospice patient. 30 mL 0  . zolpidem (AMBIEN) 5 MG tablet Take 5 mg by mouth at bedtime as needed. for sleep  1  . acetaminophen (TYLENOL) 325 MG tablet Take 650 mg by mouth every 6 (six) hours as needed for moderate pain (pain).     Marland Kitchen albuterol (PROVENTIL HFA;VENTOLIN HFA) 108 (90 BASE) MCG/ACT inhaler Inhale 2 puffs into the lungs every 4 (four) hours as needed for wheezing. (Patient not taking: Reported on 10/19/2014) 54 g 3  . ALPRAZolam (XANAX) 0.25 MG tablet TAKE 1 TABLET TWICE A DAY AS NEEDED FOR ANXIETY (Patient not taking: Reported on 10/19/2014) 20 tablet 3  . Alum & Mag Hydroxide-Simeth (MAGIC MOUTHWASH W/LIDOCAINE) SOLN Take 5 mLs by mouth  4 (four) times daily. (Patient not taking: Reported on 10/19/2014) 60 mL 1  . budesonide-formoterol (SYMBICORT) 160-4.5 MCG/ACT inhaler Inhale 2 puffs into  the lungs 2 (two) times daily. (Patient not taking: Reported on 10/19/2014) 3 Inhaler 3  . captopril (CAPOTEN) 50 MG tablet Take 1 tablet (50 mg total) by mouth 2 (two) times daily. (Patient not taking: Reported on 10/19/2014) 180 tablet 3  . Cholecalciferol (VITAMIN D PO) Take 5,000 Units by mouth daily.    . fenofibrate 160 MG tablet Take 1 tablet (160 mg total) by mouth daily. (Patient not taking: Reported on 10/19/2014) 90 tablet 3  . metoCLOPramide (REGLAN) 5 MG tablet Take 1 tablet (5 mg total) by mouth daily as needed. (Patient not taking: Reported on 10/19/2014) 90 tablet 3  . metoprolol (LOPRESSOR) 100 MG tablet Take 1 tablet (100 mg total) by mouth daily. (Patient not taking: Reported on 10/19/2014) 90 tablet 3  . NON FORMULARY Oxygen 2 liters as needed    . oxyCODONE-acetaminophen (PERCOCET/ROXICET) 5-325 MG per tablet Take 1-2 tablets by mouth every 6 (six) hours as needed. (Patient not taking: Reported on 10/19/2014) 60 tablet 0  . pantoprazole (PROTONIX) 40 MG tablet TAKE 1 TABLET BY MOUTH DAILY (Patient not taking: Reported on 10/19/2014) 90 tablet 3  . vitamin E 400 UNIT capsule Take 400 Units by mouth daily.    Marland Kitchen zolpidem (AMBIEN) 10 MG tablet Take 1 tablet (10 mg total) by mouth at bedtime as needed for sleep. (Patient not taking: Reported on 10/19/2014) 30 tablet 2   No current facility-administered medications for this visit.    ROS: See HPI for pertinent positives and negatives.   Physical Examination  Filed Vitals:   10/19/14 1458  BP: 122/50  Pulse: 56  Resp: 14  Height: 6\' 4"  (1.93 m)  Weight: 165 lb (74.844 kg)   Body mass index is 20.09 kg/(m^2).  General: WDWN in NAD  Gait: in wheelchair HENT: WNL  Eyes: Pupils equal  Pulmonary:  labored breathing at rest , moist cough Cardiac: RRR, positive for murmer    Abdomen: soft, NT, no palpable masses  Skin: several AK's on forearms/hands, see extremities; positive for multiple small varicoities in ankles.    VASCULAR EXAM  Carotid Bruits  Left  Right    Negative  Negative    Aorta is not palpable Radial pulses are 2+ palpable    VASCULAR EXAM:  Extremities with ischemic changes: dry gangrene at tips of all right toes, right foot with moderate erythema, painful to touch right foot, Plantar aspect tip of second toe with wet gangrene. Left 4th toe surgically absent.  Positive for 2-3+ bilateral pretibial pitting edema.   LE Pulses  LEFT  RIGHT   FEMORAL  palpable  palpable   POPLITEAL  Not palpable  Not palpable   POSTERIOR TIBIAL  not palpable  not palpable   DORSALIS PEDIS  ANTERIOR TIBIAL  not palpable  not palpable    Musculoskeletal: generalized muscle wasting and atrophy; left 4th toe surgically absent.  Neurologic: A&O X 3; Appropriate Affect ; SENSATION: normal; MOTOR FUNCTION: 5/5 Symmetric, CN 2-12 grossly intact except for some hearing losst, Speech is fluent/normal.          ASSESSMENT: MICAH BARNIER is a 81 y.o. male who is status post left femoropopliteal bypass graft with vein placed May of 2012. He had left 4th toe amputation in 2012.  He also had EVAR stent placement 08/31/2006 for AAA. He had an arteriogram with run off by Dr. Bridgett Larsson on 09/24/14, it was determined from this that he will need a right AKA as his lung function will not  tolerate general anesthesia needed for vascular reconstruction. He returns today as his wife wants direction as to how to care for right foot, pt is on hospice; wife states she was told by Dr. Oneida Alar that he is not a candidate for amputation as he cannot tolerate general anesthesia, but wife is basing this on her conversation with Dr. Oneida Alar before the arteriogram.  He does receive morphine for his right foot pain. Since I last saw him on  09/20/2014, dry gangrene has advanced to the tips of all of his right toes; right second toe tip with wet gangrene. Painful and moderately erythematous right foot. His respirations are labored at rest, he has a frequent moist cough. See Plan.  Face to face time with patient was 25 minutes. Over 50% of this time was spent on counseling and coordination of care.    PLAN:  Based on the patient's HPI, physical examination, review of records, and after discussing with Dr. Bridgett Larsson, pt will follow up with Dr. Oneida Alar on 10/16/2014 and discuss whether he is a candidate for right AKA without general anesthesia, possible use of spinal anesthesia since wife did not know this was a possibility.  The patient was given information about PAD including signs, symptoms, treatment, what symptoms should prompt the patient to seek immediate medical care, and risk reduction measures to take.  Perry Chambers, RN, MSN, FNP-C [facility] Phone: [phone]  Clinic MD: Bridgett Larsson  10/19/2014 3:07 PM

## 2014-10-30 ENCOUNTER — Ambulatory Visit: Payer: Medicare Other | Admitting: Pulmonary Disease

## 2014-10-31 ENCOUNTER — Ambulatory Visit: Payer: BLUE CROSS/BLUE SHIELD | Admitting: Vascular Surgery

## 2014-11-16 DEATH — deceased

## 2014-11-22 ENCOUNTER — Other Ambulatory Visit (HOSPITAL_COMMUNITY): Payer: Medicare Other

## 2014-11-22 ENCOUNTER — Ambulatory Visit: Payer: Medicare Other | Admitting: Family

## 2014-12-25 ENCOUNTER — Ambulatory Visit: Payer: Medicare Other | Admitting: Cardiovascular Disease

## 2015-05-30 ENCOUNTER — Other Ambulatory Visit (HOSPITAL_COMMUNITY): Payer: Medicare Other

## 2015-05-30 ENCOUNTER — Ambulatory Visit: Payer: Medicare Other | Admitting: Family

## 2015-05-30 ENCOUNTER — Encounter (HOSPITAL_COMMUNITY): Payer: Medicare Other
